# Patient Record
Sex: Female | Born: 1945 | Race: White | Hispanic: No | Marital: Married | Smoking: Former smoker
Health system: Southern US, Community
[De-identification: ages and names within clinical notes are randomized; demographics above are authoritative.]

## PROBLEM LIST (undated history)

## (undated) DIAGNOSIS — I10 Essential (primary) hypertension: Secondary | ICD-10-CM

## (undated) DIAGNOSIS — J302 Other seasonal allergic rhinitis: Secondary | ICD-10-CM

## (undated) DIAGNOSIS — I219 Acute myocardial infarction, unspecified: Secondary | ICD-10-CM

## (undated) DIAGNOSIS — M1990 Unspecified osteoarthritis, unspecified site: Secondary | ICD-10-CM

## (undated) DIAGNOSIS — R609 Edema, unspecified: Secondary | ICD-10-CM

## (undated) DIAGNOSIS — R0602 Shortness of breath: Secondary | ICD-10-CM

## (undated) DIAGNOSIS — E875 Hyperkalemia: Secondary | ICD-10-CM

## (undated) DIAGNOSIS — E785 Hyperlipidemia, unspecified: Secondary | ICD-10-CM

## (undated) DIAGNOSIS — E119 Type 2 diabetes mellitus without complications: Secondary | ICD-10-CM

## (undated) DIAGNOSIS — G8929 Other chronic pain: Secondary | ICD-10-CM

## (undated) DIAGNOSIS — L97409 Non-pressure chronic ulcer of unspecified heel and midfoot with unspecified severity: Secondary | ICD-10-CM

## (undated) DIAGNOSIS — J449 Chronic obstructive pulmonary disease, unspecified: Secondary | ICD-10-CM

## (undated) DIAGNOSIS — J069 Acute upper respiratory infection, unspecified: Secondary | ICD-10-CM

## (undated) DIAGNOSIS — M726 Necrotizing fasciitis: Secondary | ICD-10-CM

## (undated) DIAGNOSIS — D649 Anemia, unspecified: Secondary | ICD-10-CM

## (undated) DIAGNOSIS — L97919 Non-pressure chronic ulcer of unspecified part of right lower leg with unspecified severity: Secondary | ICD-10-CM

## (undated) DIAGNOSIS — L97929 Non-pressure chronic ulcer of unspecified part of left lower leg with unspecified severity: Secondary | ICD-10-CM

## (undated) DIAGNOSIS — I509 Heart failure, unspecified: Secondary | ICD-10-CM

## (undated) DIAGNOSIS — E46 Unspecified protein-calorie malnutrition: Secondary | ICD-10-CM

## (undated) DIAGNOSIS — N390 Urinary tract infection, site not specified: Secondary | ICD-10-CM

## (undated) DIAGNOSIS — B379 Candidiasis, unspecified: Secondary | ICD-10-CM

## (undated) DIAGNOSIS — F329 Major depressive disorder, single episode, unspecified: Secondary | ICD-10-CM

## (undated) DIAGNOSIS — I2510 Atherosclerotic heart disease of native coronary artery without angina pectoris: Secondary | ICD-10-CM

## (undated) DIAGNOSIS — N189 Chronic kidney disease, unspecified: Secondary | ICD-10-CM

## (undated) DIAGNOSIS — N200 Calculus of kidney: Secondary | ICD-10-CM

## (undated) DIAGNOSIS — K219 Gastro-esophageal reflux disease without esophagitis: Secondary | ICD-10-CM

## (undated) DIAGNOSIS — M810 Age-related osteoporosis without current pathological fracture: Secondary | ICD-10-CM

## (undated) DIAGNOSIS — I519 Heart disease, unspecified: Secondary | ICD-10-CM

## (undated) DIAGNOSIS — R339 Retention of urine, unspecified: Secondary | ICD-10-CM

## (undated) DIAGNOSIS — L89159 Pressure ulcer of sacral region, unspecified stage: Secondary | ICD-10-CM

## (undated) DIAGNOSIS — A4902 Methicillin resistant Staphylococcus aureus infection, unspecified site: Secondary | ICD-10-CM

## (undated) DIAGNOSIS — F32A Depression, unspecified: Secondary | ICD-10-CM

## (undated) DIAGNOSIS — F419 Anxiety disorder, unspecified: Secondary | ICD-10-CM

## (undated) DIAGNOSIS — Z792 Long term (current) use of antibiotics: Secondary | ICD-10-CM

## (undated) HISTORY — PX: JOINT REPLACEMENT: SHX530

## (undated) HISTORY — DX: Calculus of kidney: N20.0

## (undated) HISTORY — DX: Gastro-esophageal reflux disease without esophagitis: K21.9

## (undated) HISTORY — DX: Unspecified osteoarthritis, unspecified site: M19.90

## (undated) HISTORY — DX: Non-pressure chronic ulcer of unspecified heel and midfoot with unspecified severity: L97.409

## (undated) HISTORY — PX: CHOLECYSTECTOMY: SHX55

## (undated) HISTORY — PX: BACK SURGERY: SHX140

## (undated) HISTORY — PX: KNEE SURGERY: SHX244

## (undated) HISTORY — DX: Heart disease, unspecified: I51.9

## (undated) HISTORY — DX: Essential (primary) hypertension: I10

## (undated) HISTORY — DX: Anemia, unspecified: D64.9

## (undated) HISTORY — PX: SHOULDER SURGERY: SHX246

## (undated) HISTORY — PX: CARPAL TUNNEL RELEASE: SHX101

## (undated) HISTORY — DX: Hyperlipidemia, unspecified: E78.5

## (undated) HISTORY — PX: CORONARY ARTERY BYPASS GRAFT: SHX141

## (undated) HISTORY — DX: Type 2 diabetes mellitus without complications: E11.9

---

## 2004-09-16 ENCOUNTER — Ambulatory Visit: Payer: Self-pay | Admitting: Unknown Physician Specialty

## 2005-05-06 ENCOUNTER — Observation Stay: Payer: Self-pay | Admitting: Internal Medicine

## 2005-05-06 ENCOUNTER — Other Ambulatory Visit: Payer: Self-pay

## 2005-06-27 ENCOUNTER — Ambulatory Visit: Payer: Self-pay | Admitting: Urology

## 2005-09-18 ENCOUNTER — Ambulatory Visit: Payer: Self-pay | Admitting: Unknown Physician Specialty

## 2006-01-22 ENCOUNTER — Other Ambulatory Visit: Payer: Self-pay

## 2006-01-26 ENCOUNTER — Ambulatory Visit: Payer: Self-pay | Admitting: Internal Medicine

## 2006-02-13 ENCOUNTER — Ambulatory Visit: Payer: Self-pay | Admitting: Internal Medicine

## 2006-08-21 ENCOUNTER — Inpatient Hospital Stay: Payer: Self-pay | Admitting: General Practice

## 2006-10-18 ENCOUNTER — Ambulatory Visit: Payer: Self-pay | Admitting: Unknown Physician Specialty

## 2006-12-11 ENCOUNTER — Ambulatory Visit: Payer: Self-pay | Admitting: Rheumatology

## 2007-01-11 ENCOUNTER — Ambulatory Visit: Payer: Self-pay | Admitting: Urology

## 2007-01-23 ENCOUNTER — Ambulatory Visit: Payer: Self-pay | Admitting: Urology

## 2007-02-28 ENCOUNTER — Ambulatory Visit: Payer: Self-pay | Admitting: Urology

## 2007-02-28 ENCOUNTER — Other Ambulatory Visit: Payer: Self-pay

## 2007-03-07 ENCOUNTER — Ambulatory Visit: Payer: Self-pay | Admitting: Urology

## 2007-03-22 ENCOUNTER — Ambulatory Visit: Payer: Self-pay | Admitting: Urology

## 2007-07-23 ENCOUNTER — Ambulatory Visit: Payer: Self-pay | Admitting: Urology

## 2007-08-01 ENCOUNTER — Ambulatory Visit: Payer: Self-pay | Admitting: Urology

## 2007-10-24 ENCOUNTER — Ambulatory Visit: Payer: Self-pay | Admitting: Unknown Physician Specialty

## 2008-01-28 ENCOUNTER — Ambulatory Visit: Payer: Self-pay | Admitting: Urology

## 2008-05-04 ENCOUNTER — Ambulatory Visit: Payer: Self-pay

## 2008-10-26 ENCOUNTER — Ambulatory Visit: Payer: Self-pay | Admitting: Urology

## 2008-11-10 ENCOUNTER — Ambulatory Visit: Payer: Self-pay | Admitting: Specialist

## 2008-12-24 ENCOUNTER — Ambulatory Visit: Payer: Self-pay | Admitting: Unknown Physician Specialty

## 2009-03-29 ENCOUNTER — Ambulatory Visit: Payer: Self-pay | Admitting: Urology

## 2009-04-15 ENCOUNTER — Ambulatory Visit: Payer: Self-pay | Admitting: Urology

## 2009-05-04 ENCOUNTER — Ambulatory Visit: Payer: Self-pay | Admitting: Urology

## 2009-05-13 ENCOUNTER — Ambulatory Visit: Payer: Self-pay | Admitting: Urology

## 2009-06-04 ENCOUNTER — Ambulatory Visit: Payer: Self-pay | Admitting: Urology

## 2009-10-26 ENCOUNTER — Ambulatory Visit: Payer: Self-pay | Admitting: Urology

## 2010-03-24 ENCOUNTER — Ambulatory Visit: Payer: Self-pay | Admitting: Unknown Physician Specialty

## 2010-05-09 ENCOUNTER — Ambulatory Visit: Payer: Self-pay | Admitting: Urology

## 2011-05-08 ENCOUNTER — Ambulatory Visit: Payer: Self-pay | Admitting: Urology

## 2011-06-14 ENCOUNTER — Ambulatory Visit: Payer: Self-pay | Admitting: Family Medicine

## 2011-12-04 ENCOUNTER — Ambulatory Visit: Payer: Self-pay | Admitting: Unknown Physician Specialty

## 2012-04-23 ENCOUNTER — Ambulatory Visit: Payer: Self-pay | Admitting: Urology

## 2012-05-08 ENCOUNTER — Ambulatory Visit: Payer: Self-pay | Admitting: Rheumatology

## 2012-05-13 ENCOUNTER — Ambulatory Visit: Payer: Self-pay | Admitting: Rheumatology

## 2012-06-07 ENCOUNTER — Ambulatory Visit: Payer: Self-pay | Admitting: General Practice

## 2012-08-15 ENCOUNTER — Ambulatory Visit: Payer: Self-pay | Admitting: General Practice

## 2012-08-21 ENCOUNTER — Ambulatory Visit: Payer: Self-pay | Admitting: General Practice

## 2012-12-04 ENCOUNTER — Ambulatory Visit: Payer: Self-pay | Admitting: Unknown Physician Specialty

## 2013-04-28 ENCOUNTER — Ambulatory Visit: Payer: Self-pay | Admitting: Urology

## 2013-12-05 ENCOUNTER — Ambulatory Visit: Payer: Self-pay | Admitting: Internal Medicine

## 2014-02-06 ENCOUNTER — Ambulatory Visit: Payer: Self-pay | Admitting: Unknown Physician Specialty

## 2014-02-10 LAB — PATHOLOGY REPORT

## 2014-04-14 ENCOUNTER — Other Ambulatory Visit: Payer: Self-pay | Admitting: Podiatry

## 2014-04-19 LAB — WOUND CULTURE

## 2014-05-04 ENCOUNTER — Inpatient Hospital Stay: Payer: Self-pay | Admitting: Internal Medicine

## 2014-05-04 LAB — CBC WITH DIFFERENTIAL/PLATELET
BASOS PCT: 0.3 %
Basophil #: 0 10*3/uL (ref 0.0–0.1)
EOS ABS: 0 10*3/uL (ref 0.0–0.7)
Eosinophil %: 0.4 %
HCT: 34.9 % — ABNORMAL LOW (ref 35.0–47.0)
HGB: 11.2 g/dL — AB (ref 12.0–16.0)
Lymphocyte #: 0.9 10*3/uL — ABNORMAL LOW (ref 1.0–3.6)
Lymphocyte %: 13.7 %
MCH: 27.7 pg (ref 26.0–34.0)
MCHC: 32.2 g/dL (ref 32.0–36.0)
MCV: 86 fL (ref 80–100)
Monocyte #: 0.4 x10 3/mm (ref 0.2–0.9)
Monocyte %: 6.8 %
Neutrophil #: 5.1 10*3/uL (ref 1.4–6.5)
Neutrophil %: 78.8 %
Platelet: 174 10*3/uL (ref 150–440)
RBC: 4.05 10*6/uL (ref 3.80–5.20)
RDW: 14.2 % (ref 11.5–14.5)
WBC: 6.5 10*3/uL (ref 3.6–11.0)

## 2014-05-04 LAB — BASIC METABOLIC PANEL
Anion Gap: 5 — ABNORMAL LOW (ref 7–16)
BUN: 19 mg/dL — ABNORMAL HIGH (ref 7–18)
Calcium, Total: 9.3 mg/dL (ref 8.5–10.1)
Chloride: 106 mmol/L (ref 98–107)
Co2: 27 mmol/L (ref 21–32)
Creatinine: 1 mg/dL (ref 0.60–1.30)
EGFR (African American): >60
GFR CALC NON AF AMER: 58 — AB
Glucose: 191 mg/dL — ABNORMAL HIGH (ref 65–99)
Osmolality: 283 (ref 275–301)
POTASSIUM: 3.8 mmol/L (ref 3.5–5.1)
Sodium: 138 mmol/L (ref 136–145)

## 2014-05-06 LAB — SEDIMENTATION RATE: ERYTHROCYTE SED RATE: 74 mm/h — AB (ref 0–30)

## 2014-05-07 LAB — CLOSTRIDIUM DIFFICILE(ARMC)

## 2014-05-08 LAB — BASIC METABOLIC PANEL
ANION GAP: 5 — AB (ref 7–16)
BUN: 20 mg/dL — ABNORMAL HIGH (ref 7–18)
Calcium, Total: 8.6 mg/dL (ref 8.5–10.1)
Chloride: 105 mmol/L (ref 98–107)
Co2: 30 mmol/L (ref 21–32)
Creatinine: 0.82 mg/dL (ref 0.60–1.30)
GLUCOSE: 91 mg/dL (ref 65–99)
Osmolality: 282 (ref 275–301)
Potassium: 3.4 mmol/L — ABNORMAL LOW (ref 3.5–5.1)
Sodium: 140 mmol/L (ref 136–145)

## 2014-05-08 LAB — PLATELET COUNT: PLATELETS: 149 10*3/uL — AB (ref 150–440)

## 2014-05-09 LAB — CBC WITH DIFFERENTIAL/PLATELET
BASOS ABS: 0 10*3/uL (ref 0.0–0.1)
BASOS PCT: 0.4 %
Eosinophil #: 0 10*3/uL (ref 0.0–0.7)
Eosinophil %: 0.8 %
HCT: 31.8 % — AB (ref 35.0–47.0)
HGB: 10.2 g/dL — AB (ref 12.0–16.0)
LYMPHS ABS: 1.3 10*3/uL (ref 1.0–3.6)
LYMPHS PCT: 25 %
MCH: 27.1 pg (ref 26.0–34.0)
MCHC: 31.9 g/dL — AB (ref 32.0–36.0)
MCV: 85 fL (ref 80–100)
Monocyte #: 0.4 x10 3/mm (ref 0.2–0.9)
Monocyte %: 8.3 %
Neutrophil #: 3.3 10*3/uL (ref 1.4–6.5)
Neutrophil %: 65.5 %
PLATELETS: 162 10*3/uL (ref 150–440)
RBC: 3.75 10*6/uL — ABNORMAL LOW (ref 3.80–5.20)
RDW: 14.1 % (ref 11.5–14.5)
WBC: 5 10*3/uL (ref 3.6–11.0)

## 2014-05-09 LAB — BASIC METABOLIC PANEL
ANION GAP: 4 — AB (ref 7–16)
BUN: 24 mg/dL — ABNORMAL HIGH (ref 7–18)
CHLORIDE: 104 mmol/L (ref 98–107)
CREATININE: 0.88 mg/dL (ref 0.60–1.30)
Calcium, Total: 9.2 mg/dL (ref 8.5–10.1)
Co2: 33 mmol/L — ABNORMAL HIGH (ref 21–32)
EGFR (African American): >60
EGFR (Non-African Amer.): >60
Glucose: 63 mg/dL — ABNORMAL LOW (ref 65–99)
OSMOLALITY: 283 (ref 275–301)
Potassium: 3.8 mmol/L (ref 3.5–5.1)
SODIUM: 141 mmol/L (ref 136–145)

## 2014-05-09 LAB — PATHOLOGY REPORT

## 2014-05-09 LAB — WOUND CULTURE

## 2014-05-10 LAB — CLOSTRIDIUM DIFFICILE(ARMC)

## 2014-05-10 LAB — WOUND CULTURE

## 2014-05-26 ENCOUNTER — Inpatient Hospital Stay: Payer: Self-pay | Admitting: Internal Medicine

## 2014-05-26 LAB — CREATININE, SERUM
Creatinine: 1.33 mg/dL — ABNORMAL HIGH (ref 0.60–1.30)
GFR CALC AF AMER: 48 — AB
GFR CALC NON AF AMER: 41 — AB

## 2014-05-27 LAB — CBC WITH DIFFERENTIAL/PLATELET
BASOS PCT: 0.2 %
Basophil #: 0 10*3/uL (ref 0.0–0.1)
EOS PCT: 0.6 %
Eosinophil #: 0 10*3/uL (ref 0.0–0.7)
HCT: 33.2 % — ABNORMAL LOW (ref 35.0–47.0)
HGB: 10.6 g/dL — ABNORMAL LOW (ref 12.0–16.0)
LYMPHS PCT: 17.5 %
Lymphocyte #: 1.1 10*3/uL (ref 1.0–3.6)
MCH: 27.3 pg (ref 26.0–34.0)
MCHC: 31.9 g/dL — AB (ref 32.0–36.0)
MCV: 86 fL (ref 80–100)
MONO ABS: 0.4 x10 3/mm (ref 0.2–0.9)
Monocyte %: 6.3 %
Neutrophil #: 4.9 10*3/uL (ref 1.4–6.5)
Neutrophil %: 75.4 %
Platelet: 120 10*3/uL — ABNORMAL LOW (ref 150–440)
RBC: 3.88 10*6/uL (ref 3.80–5.20)
RDW: 14.9 % — ABNORMAL HIGH (ref 11.5–14.5)
WBC: 6.5 10*3/uL (ref 3.6–11.0)

## 2014-05-27 LAB — BASIC METABOLIC PANEL
ANION GAP: 9 (ref 7–16)
BUN: 30 mg/dL — ABNORMAL HIGH (ref 7–18)
CO2: 27 mmol/L (ref 21–32)
Calcium, Total: 8.8 mg/dL (ref 8.5–10.1)
Chloride: 105 mmol/L (ref 98–107)
Creatinine: 1.2 mg/dL (ref 0.60–1.30)
GFR CALC AF AMER: 54 — AB
GFR CALC NON AF AMER: 47 — AB
GLUCOSE: 156 mg/dL — AB (ref 65–99)
Osmolality: 291 (ref 275–301)
Potassium: 3.7 mmol/L (ref 3.5–5.1)
Sodium: 141 mmol/L (ref 136–145)

## 2014-05-29 LAB — CBC WITH DIFFERENTIAL/PLATELET
BASOS ABS: 0 10*3/uL (ref 0.0–0.1)
BASOS PCT: 0.8 %
Eosinophil #: 0 10*3/uL (ref 0.0–0.7)
Eosinophil %: 0.3 %
HCT: 29.4 % — ABNORMAL LOW (ref 35.0–47.0)
HGB: 9.6 g/dL — ABNORMAL LOW (ref 12.0–16.0)
LYMPHS ABS: 0.9 10*3/uL — AB (ref 1.0–3.6)
Lymphocyte %: 15.1 %
MCH: 28.4 pg (ref 26.0–34.0)
MCHC: 32.6 g/dL (ref 32.0–36.0)
MCV: 87 fL (ref 80–100)
Monocyte #: 0.3 x10 3/mm (ref 0.2–0.9)
Monocyte %: 5.5 %
Neutrophil #: 4.7 10*3/uL (ref 1.4–6.5)
Neutrophil %: 78.3 %
PLATELETS: 114 10*3/uL — AB (ref 150–440)
RBC: 3.38 10*6/uL — AB (ref 3.80–5.20)
RDW: 14.7 % — ABNORMAL HIGH (ref 11.5–14.5)
WBC: 6.1 10*3/uL (ref 3.6–11.0)

## 2014-05-29 LAB — BASIC METABOLIC PANEL
Anion Gap: 8 (ref 7–16)
BUN: 26 mg/dL — ABNORMAL HIGH (ref 7–18)
CHLORIDE: 106 mmol/L (ref 98–107)
CO2: 27 mmol/L (ref 21–32)
Calcium, Total: 8.4 mg/dL — ABNORMAL LOW (ref 8.5–10.1)
Creatinine: 1.05 mg/dL (ref 0.60–1.30)
EGFR (African American): >60
EGFR (Non-African Amer.): 55 — ABNORMAL LOW
Glucose: 196 mg/dL — ABNORMAL HIGH (ref 65–99)
Osmolality: 291 (ref 275–301)
POTASSIUM: 3.6 mmol/L (ref 3.5–5.1)
SODIUM: 141 mmol/L (ref 136–145)

## 2014-05-29 LAB — VANCOMYCIN, TROUGH: Vancomycin, Trough: 11 ug/mL (ref 10–20)

## 2014-05-29 LAB — SEDIMENTATION RATE: ERYTHROCYTE SED RATE: 59 mm/h — AB (ref 0–30)

## 2014-05-30 LAB — CBC WITH DIFFERENTIAL/PLATELET
BASOS ABS: 0 10*3/uL (ref 0.0–0.1)
BASOS PCT: 0.5 %
EOS ABS: 0 10*3/uL (ref 0.0–0.7)
EOS PCT: 0.7 %
HCT: 30.5 % — ABNORMAL LOW (ref 35.0–47.0)
HGB: 10.1 g/dL — ABNORMAL LOW (ref 12.0–16.0)
LYMPHS PCT: 14.2 %
Lymphocyte #: 0.8 10*3/uL — ABNORMAL LOW (ref 1.0–3.6)
MCH: 28.1 pg (ref 26.0–34.0)
MCHC: 33.2 g/dL (ref 32.0–36.0)
MCV: 85 fL (ref 80–100)
MONOS PCT: 5 %
Monocyte #: 0.3 x10 3/mm (ref 0.2–0.9)
NEUTROS PCT: 79.6 %
Neutrophil #: 4.5 10*3/uL (ref 1.4–6.5)
Platelet: 121 10*3/uL — ABNORMAL LOW (ref 150–440)
RBC: 3.6 10*6/uL — AB (ref 3.80–5.20)
RDW: 14.6 % — AB (ref 11.5–14.5)
WBC: 5.7 10*3/uL (ref 3.6–11.0)

## 2014-05-30 LAB — BASIC METABOLIC PANEL
Anion Gap: 6 — ABNORMAL LOW (ref 7–16)
BUN: 22 mg/dL — ABNORMAL HIGH (ref 7–18)
CALCIUM: 8.9 mg/dL (ref 8.5–10.1)
CHLORIDE: 106 mmol/L (ref 98–107)
CREATININE: 0.85 mg/dL (ref 0.60–1.30)
Co2: 29 mmol/L (ref 21–32)
EGFR (Non-African Amer.): >60
Glucose: 75 mg/dL (ref 65–99)
OSMOLALITY: 283 (ref 275–301)
Potassium: 3.6 mmol/L (ref 3.5–5.1)
Sodium: 141 mmol/L (ref 136–145)

## 2014-05-30 LAB — WOUND CULTURE

## 2014-05-30 LAB — CLOSTRIDIUM DIFFICILE(ARMC)

## 2014-05-31 LAB — CBC WITH DIFFERENTIAL/PLATELET
Basophil #: 0 10*3/uL (ref 0.0–0.1)
Basophil %: 0.6 %
EOS PCT: 0.6 %
Eosinophil #: 0 10*3/uL (ref 0.0–0.7)
HCT: 29.7 % — ABNORMAL LOW (ref 35.0–47.0)
HGB: 9.6 g/dL — ABNORMAL LOW (ref 12.0–16.0)
Lymphocyte #: 0.8 10*3/uL — ABNORMAL LOW (ref 1.0–3.6)
Lymphocyte %: 15 %
MCH: 27.6 pg (ref 26.0–34.0)
MCHC: 32.4 g/dL (ref 32.0–36.0)
MCV: 85 fL (ref 80–100)
MONOS PCT: 6.1 %
Monocyte #: 0.3 x10 3/mm (ref 0.2–0.9)
NEUTROS ABS: 4.1 10*3/uL (ref 1.4–6.5)
Neutrophil %: 77.7 %
Platelet: 106 10*3/uL — ABNORMAL LOW (ref 150–440)
RBC: 3.49 10*6/uL — AB (ref 3.80–5.20)
RDW: 14.7 % — ABNORMAL HIGH (ref 11.5–14.5)
WBC: 5.2 10*3/uL (ref 3.6–11.0)

## 2014-05-31 LAB — BASIC METABOLIC PANEL
ANION GAP: 7 (ref 7–16)
BUN: 21 mg/dL — AB (ref 7–18)
CHLORIDE: 104 mmol/L (ref 98–107)
Calcium, Total: 8.6 mg/dL (ref 8.5–10.1)
Co2: 29 mmol/L (ref 21–32)
Creatinine: 0.97 mg/dL (ref 0.60–1.30)
EGFR (African American): >60
Glucose: 166 mg/dL — ABNORMAL HIGH (ref 65–99)
OSMOLALITY: 286 (ref 275–301)
Potassium: 4 mmol/L (ref 3.5–5.1)
Sodium: 140 mmol/L (ref 136–145)

## 2014-05-31 LAB — CULTURE, BLOOD (SINGLE)

## 2014-05-31 LAB — WOUND CULTURE

## 2014-06-01 LAB — VANCOMYCIN, TROUGH: Vancomycin, Trough: 19 ug/mL (ref 10–20)

## 2014-06-02 ENCOUNTER — Encounter: Payer: Self-pay | Admitting: Internal Medicine

## 2014-06-04 LAB — CREATININE, SERUM
Creatinine: 0.9 mg/dL (ref 0.60–1.30)
EGFR (African American): >60

## 2014-06-04 LAB — VANCOMYCIN, TROUGH: Vancomycin, Trough: 26 ug/mL (ref 10–20)

## 2014-06-17 LAB — CULTURE, FUNGUS WITHOUT SMEAR

## 2014-06-20 ENCOUNTER — Encounter: Payer: Self-pay | Admitting: Internal Medicine

## 2014-07-02 ENCOUNTER — Encounter: Payer: Self-pay | Admitting: Internal Medicine

## 2014-12-08 ENCOUNTER — Ambulatory Visit: Payer: Self-pay | Admitting: Internal Medicine

## 2015-01-14 ENCOUNTER — Other Ambulatory Visit: Payer: Self-pay | Admitting: Podiatry

## 2015-01-14 ENCOUNTER — Inpatient Hospital Stay: Payer: Self-pay | Admitting: Internal Medicine

## 2015-03-09 NOTE — Op Note (Signed)
PATIENT NAME:  Suzanne Ewing, Suzanne Ewing MR#:  [id number] DATE OF BIRTH:  [birth year]  DATE OF PROCEDURE:  08/21/2012  PREOPERATIVE DIAGNOSIS: Right rotator cuff tear.   POSTOPERATIVE DIAGNOSIS: Right rotator cuff tear.    PROCEDURE PERFORMED: Right subacromial decompression and rotator cuff repair.   SURGEON: Laurice Record. Holley Bouche., MD   ANESTHESIA: General.   ESTIMATED BLOOD LOSS: Minimal.   FLUIDS REPLACED: 1600 mL of Crystalloid.   DRAINS: None.   IMPLANTS UTILIZED: ArthroCare 6.5 mm Spartan PEEK suture implants x2.   INDICATIONS FOR SURGERY: The patient is a 69 year old female who has been seen for complaints of right shoulder pain. MRI demonstrated findings consistent with a complete tear of the supraspinatus tendon with retraction. After discussion of the risks and benefits of surgical intervention, the patient expressed her understanding of the risks and benefits and agreed with plans for surgical intervention.   PROCEDURE IN DETAIL: The patient was brought in the operating room and, after adequate general anesthesia was achieved, the patient was placed in the modified beach chair position. The head was secured in a headrest and all bony prominences were well padded. The patient's right shoulder and arm were cleaned and prepped with alcohol and DuraPrep and draped in the usual sterile fashion. A "time-out" was performed as per usual protocol. The anticipated incision site was injected with 0.25% Marcaine with epinephrine. Anterior oblique incision was made roughly bisecting the anterior aspect of the acromion. Fibers of the deltoid were split in line and a "deltoid on" approach was utilized by elevating the deltoid in a subperiosteal fashion off of the acromion. The subdeltoid bursa was noted to be inflamed and was excised. There was complete tear of the supraspinatus tendon with retraction noted. An osteotome was used to remove the anterior-inferior wafer of bone from the acromion measuring  approximately 3 to 4 mm in thickness. A TPS high-speed rasp was then used to further debride and contour the subacromial area thus allowing for adequate decompression. The tendon of the supraspinatus was carefully teased and mobilized. This allowed for sufficient mobilization to reduce the leading edge of the tendon. Bed was created using rongeurs along the greater tuberosity. Two 6.5 mm Spartan PEEK suture anchors were then inserted. The supraspinatus tendon was repaired using the associated #2 Magnum wire sutures. Shoulder was placed through a range of motion with good maintenance of the repair. Wound was irrigated with copious amounts of normal saline with antibiotic solution. The deltoid was repaired in a side-to-side fashion using interrupted sutures of #1 Ethibond. Subcutaneous tissue was approximated in layers using first #0 Vicryl followed by #2-0 Vicryl. Skin was closed with a running subcuticular suture of #4-0 Vicryl. Steri-Strips were applied followed by application of a sterile dressing.   The patient tolerated the procedure well. She was transported to the recovery room in stable condition.   ____________________________ Laurice Record. Holley Bouche., MD jph:drc D: 08/21/2012 19:51:40 ET T: 08/22/2012 09:58:41 ET JOB#: [id number]  cc: Laurice Record. Holley Bouche., MD, <Dictator> JAMES P Holley Bouche MD ELECTRONICALLY SIGNED 08/22/2012 21:12

## 2015-03-13 NOTE — Consult Note (Signed)
PATIENT NAME:  Suzanne Ewing, Suzanne Ewing MR#:  [id number] DATE OF BIRTH:  13-Apr-1946  DATE OF CONSULTATION:  05/05/2014  REFERRING PHYSICIAN:     Dr. Elvina Mattes CONSULTING PHYSICIAN:  Cheral Marker. Ola Spurr, MD  DATE OF BIRTH: 04/03/1955.   DATE OF CONSULTATION: 05/05/2014.   REASON FOR CONSULT: Non-healing wound and ulceration on the left heel.   This is a very pleasant 69 year old female, history of type 2 diabetes on insulin as well as rheumatoid arthritis, who has been battling a heel ulcer for the last several months per her. She has been following with Dr. Vickki Muff for this. She has had previous cultures grown proteus and enterococcus. She says she was most recently on a course of levofloxacin as an outpatient and, after 10 days, it ran out and she was unable to get it refilled. That is when the wound started to worsen. It has since progressed. She was admitted for further IV antibiotics and surgical management of the wound. Since admission, she has been keeping it elevated. This has  continued to drain. She reports her diabetes is relatively well controlled.   PAST MEDICAL HISTORY:  1. Rheumatoid arthritis.  2. Type 2 diabetes.  3. Lumbar spinal stenosis with neurogenic claudication.  4. Chronic anemia.  5. GERD.  6. Nephrolithiasis.  7. Hypertension.  8. Left total knee replacement.   ALLERGIES: MACROBID , NITROGLYCERIN, AND TAPE.   PAST SURGICAL HISTORY: Left total knee replacement.    SOCIAL HISTORY: The patient lives at home. She does not smoke or drink.   MEDICATIONS:  Antibiotics since admission include meropenem. Other medications are reviewed. She is noted to be on prednisone chronically. She is also on an ACE inhibitor, insulin, glimepiride, Lasix, folic acid, ferrous sulfate, etodolac, diltiazem, calcium carbonate, aspirin, multivitamin, an omega-3 fatty acid, as well as pantoprazole, and tramadol.   REVIEW OF SYSTEMS: Eleven systems reviewed and negative except as per HPI.   PHYSICAL  EXAMINATION:  VITAL SIGNS: Temperature 97.8, pulse is 96, blood pressure 121/54, respirations 18, saturations 100% on room air.  GENERAL: She is pleasant, interactive, sitting up in a chair in no acute distress.  HEENT: Pupils equal, round, and reactive to light and accommodation. Extraocular movements are intact. Sclerae are anicteric. Oropharynx is clear.  NECK: Supple.  HEART: Regular.  LUNGS: Clear to auscultation bilaterally.  ABDOMEN: Soft, obese, nontender, nondistended.  EXTREMITIES: On her right leg, she has 1+ edema. She has an opened heel ulcer with some serosanguineous drainage. There is some mild surrounding erythema and warmth. She has range of motion in the ankle. Pulses are intact.   LABORATORY DATA: White blood count on admission 6.5, hemoglobin 11.2, platelets 174,000; BUN 19, creatinine 1.0. Wound culture is pending from this admission. Gram stain showed a few white blood cells and a few yeast. Colonies are too small to grow. Culture from the wound May 26th, grew heavy growth proteus, enterococcus and non-group of low beta hemolytic strep. The Proteus was sensitive to cefazolin, ceftriaxone, gentamicin, ceftazidime, imipenem, levofloxacin, and cefoxitin. It was resistant to Bactrim and ampicillin. It was intermediate to ciprofloxacin. The enterococcus was sensitive to ampicillin and linezolid.   IMAGING: MRI of the heel done June 16th, showed air and fluid collections in the subcutaneous tissue along the medial aspect of the calcaneus compatible with abscesses. No evidence of osteomyelitis. There was extensive and intense subcutaneous edema about the lower leg and foot which could be due to cellulitis and/or dependent edema.   IMPRESSION: A 69 year old with diabetes  and rheumatoid arthritis, now with a non-healing heel ulcer. Prior cultures with proteus enterococcus and streptococcus, non-typeable.   SHE IS ALLERGIC TO penicillin .   She has failed outpatient therapy with  levofloxacin, although she had run out of it for a while. There is evidence of abscesses on the MRI, but no evidence of osteomyelitis.   RECOMMENDATIONS:  1. I suggest debridement based on podiatry evaluation.  2. Cultures are pending.  3. Continue meropenem at this point.  4. Check an ESR in the morning.  5. Depending on final culture results, may be able to tailor to oral antibiotics. 6. SHE IS ALLERGIC TO penicillin , WHICH WOULD BE DIFFICULT SINCE THE ENTEROCOCCUS IS USUALLY TREATED WITH AMPICILLIN.  7. I will ask the lab to add on extra sensitivities if the enterococcus grows again. 8. Thank you for the consult. Be glad to follow with you    ____________________________ Cheral Marker. Ola Spurr, MD dpf:ts D: 05/05/2014 16:13:00 ET T: 05/05/2014 16:49:38 ET JOB#: [id number]  cc: Cheral Marker. Ola Spurr, MD, <Dictator> DAVID Ola Spurr MD ELECTRONICALLY SIGNED 05/14/2014 13:27

## 2015-03-13 NOTE — Consult Note (Signed)
PATIENT NAME:  Suzanne Ewing, Suzanne Ewing MR#:  [id number] DATE OF BIRTH:  December 13, 1945  DATE OF CONSULTATION:  05/28/2014  REQUESTING PHYSICIAN:  Dr. Elvina Mattes CONSULTING PHYSICIAN:  Cheral Marker. Ola Spurr, MD  REASON FOR CONSULTATION: Recurrent heel ulceration.   HISTORY OF PRESENT ILLNESS: This is a very pleasant 69 year old female with history of type 2 diabetes and rheumatoid arthritis who had been admitted initially in June with progression of a heel ulcer for several months. At that time, previous cultures have grown Proteus and enterococcus.   She underwent some debridement at that time and was discharged on levofloxacin. However, she reports the wound worsened and she was readmitted for repeat debridement. She has had surgery on July 8th by Dr. Vickki Muff. He did find ulcerations and abscess down to the bone. The wound is now dressed with a wound VAC and there were antibiotic beads placed.   PAST MEDICAL HISTORY:  1. Diabetes.  2. Rheumatoid arthritis.  3. Spinal stenosis with neurogenic claudication.  4. Chronic anemia.  5. GERD.  6. Nephrolithiasis.  7. Hypertension.  8. Left knee replacement.   PAST SURGICAL HISTORY: Left knee replacement.   SOCIAL HISTORY: Lives at home.  Does not smoke or drink.   ALLERGIES: SHE IS ALLERGIC TO MACROBID, NITROGLYCERIN, AND TAPE.   FAMILY HISTORY: Noncontributory.   REVIEW OF SYSTEMS:  Eleven systems reviewed and negative except as per HPI.   ANTIBIOTICS SINCE ADMISSION: Include clindamycin, meropenem, and vancomycin.   PHYSICAL EXAMINATION:  VITAL SIGNS: Temperature 98.1, pulse 82, blood pressure 114/65, respirations 17, saturations 94% on room air. She has been afebrile since admission.  GENERAL: She is pleasant, interactive, lying in bed in no acute distress.  HEENT: Pupils equal, round, reactive to light and accommodation. Extraocular movements are intact. Sclerae anicteric.  OROPHARYNX: Clear.  NECK: Supple.  HEART: Regular.  LUNGS: Clear to  auscultation bilaterally.  EXTREMITIES: She has an ulcer in the left heel that has been debrided and is now covered with a wound VAC. There is some mild surrounding erythema.  LABORATORY, DIAGNOSTIC AND RADIOLOGICAL DATA: Richrd Humbles is reviewed. Previous cultures done in May had heavy growth of Proteus as well as Enterococcus faecalis and non-group  beta-hemolytic strep. Wound culture June 15th grew Candida as well as Proteus. Wound culture June 17th was negative. Clostridium difficile  June 17th was negative. Blood cultures, July 7th, no growth to date. Wound culture, July 8th, no growth to date. White blood count was 6.5. Hemoglobin 10.6, platelets 120,000, creatinine 1.2 with a GFR of 47.  Imaging: X-ray of the heel shows no radiographic evidence of osteomyelitis.   IMPRESSION: A 69 year old with diabetes as well as rheumatoid arthritis on immunosuppressives readmitted with worsening heel ulceration. She had failed outpatient treatment with levofloxacin. She is now status post debridement July 8th. There does seem to be bony involvement. She is currently on meropenem and vancomycin.   RECOMMENDATIONS:  Continue current antibiotics while awaiting cultures. PICC line has been placed. She will likely need at least 4-6 weeks of IV antibiotics. Further final recommendations would be based on what grows with this.  SHE DOES HAVE AN ALLERGY TO PENICILLIN. That may make it more complicated to treat the enterococcus.   Thank you for the consult. I will be glad to follow with you.    ____________________________ Cheral Marker. Ola Spurr, MD dpf:dd D: 05/28/2014 22:59:43 ET T: 05/29/2014 01:52:43 ET JOB#: [id number]  cc: Cheral Marker. Ola Spurr, MD, <Dictator> DAVID Ola Spurr MD ELECTRONICALLY SIGNED 06/08/2014 21:19

## 2015-03-13 NOTE — Consult Note (Signed)
Details:   - full note dicated. Plan for I & D tomorrow. NPO for now. Wound culture now and in OR tomorrow. PT consult.   Electronic Signatures: Samara Deist (MD)  (Signed 07-Jul-15 19:52)  Authored: Details   Last Updated: 07-Jul-15 19:52 by Samara Deist (MD)

## 2015-03-13 NOTE — Consult Note (Signed)
PATIENT NAME:  Suzanne Ewing, Suzanne Ewing MR#:  [id number] DATE OF BIRTH:  1946-08-03  DATE OF CONSULTATION:  05/04/2014  CONSULTING PHYSICIAN:  Gerrit Heck. Kaia Depaolis, DPM  REASON FOR CONSULTATION: Nonhealing wound, left heel.   HISTORY OF PRESENT ILLNESS: The patient has had an ulceration on this heel for several months. Dr. Vickki Muff has been following her.  She underwent a couple of rounds of antibiotics, but this continues to drain and cause problems. Previous cultures showed positive cultures for Proteus and enterococcus. She has been started on meropenem by Dr. Posey Pronto, has some drainage from the wound and some redness to her leg. She is a 69 year old female.   PAST MEDICAL HISTORY:  Includes total left knee rheumatoid arthritis, type 2 diabetes which is insulin-dependent, significant lumbar spinal stenosis with neurogenic claudication, chronic anemia, GERD, nephrolithiasis, hypertension and also nonhealing left heel ulcer. Has a history of UTIs as well.   ALLERGIES: Include MACROBID, PENICILLIN, NITROGLYCERIN AND TAPE.  HOME MEDICATIONS: Are currently pending and reconsolidation from the primary care.  The only lab available at this time is blood glucose which was 213.  PHYSICAL EXAMINATION: VITAL SIGNS: Include temperature 98.4, pulse 101, respirations 20, blood pressure is 154/74. Pulse ox is 98. LOWER EXTREMITY: The patient just had the dressing changed and is scheduled for an MRI tomorrow. I will hold off on wound evaluation until after she gets the MRI so I can see it relative to the possibility of osteomyelitis in the region. I will follow her tomorrow once she gets this done and correlate the findings with treatment plan.    ____________________________ Gerrit Heck. Khyla Mccumbers, DPM mgt:ce D: 05/04/2014 18:30:06 ET T: 05/04/2014 19:06:23 ET JOB#: [id number]  cc: Gerrit Heck Alegra Rost, DPM, <Dictator> Perry Mount MD ELECTRONICALLY SIGNED 05/07/2014 7:59

## 2015-03-13 NOTE — Discharge Summary (Signed)
Dates of Admission and Diagnosis:  Date of Admission 04-May-2014   Date of Discharge 11-May-2014   Admitting Diagnosis Non-healing diabetic foot ulcer and lower leg cellulitis   Final Diagnosis Non-healing diabetic foot ulcer and lower leg cellulitis   Discharge Diagnosis 1 Non-healing diabetic foot ulcer and lower leg cellulitis   2 diabetes   3 hypertension   4 rheumatoid arthritis    Chief Complaint/History of Present Illness 69 year old lady with h/o diabetes, hypertension and rheumatoid arthritis presented with non healing diabetic foot ulcer on left heel. She was noted to have redness and swelling of left lower leg c/w cellulitis. LE doppler was negative for DVT.   Allergies:  Macrobid: Anaphylaxis  NTG: Other  Penicillin: Itching, Anaphylaxis, Resp. Distress  Macrodantin: Rash  Naproxen: Rash  Nitrofurantoin: Other  Morphine: Other  Biaxin: Other  Tape: Other  Routine Micro:  15-Jun-15 16:57   Micro Text Report WOUND AER/ANAEROBIC CULT   ORGANISM 1                MODERATE GROWTH CANDIDA ALBICANS   ORGANISM 2                LIGHT GROWTH PROTEUS MIRABILIS   COMMENT                   NO ANAEROBES ISOLATED IN 4 DAYS   COMMENT                   WITH NORMAL SKIN FLORA   GRAM STAIN                FEW WHITE BLOOD CELLS   GRAM STAIN                FEW YEAST   ANTIBIOTIC                    ORG#1    ORG#2     AMPICILLIN                             R         CEFAZOLIN                              S CEFOXITIN                              S         CEFTAZIDIME                            S         CEFTRIAXONE                            S         CIPROFLOXACIN                          I         GENTAMICIN                             S         IMIPENEM  S         LEVOFLOXACIN                           S         TRIMETHOPRIM/SULFAMETHOXAZOLE          R  Specimen Source LEFT FOOT ULCER  Culture Comment NO ANAEROBES ISOLATED IN 4 DAYS  Gram Stain  1 FEW WHITE BLOOD CELLS  Gram Stain 2 FEW YEAST  Result(s) reported on 09 May 2014 at 11:20AM.  Organism Name Oso  Organism Name CANDIDA ALBICANS  Organism Quantity LIGHT GROWTH  Organism Quantity MODERATE GROWTH  Cefazolin Sensitivity S  Ampicillin Sensitivity R  Ceftriaxone Sensitivity S  Ciprofloxacin Sensitivity I  Gentamicin Sensitivity S  Ceftazidime Sensitivity S  Imipenem Sensitivity S  Levofloxacin Sensitivity S  Trimethoprim/Sulfamethoxazole Sensitivty R  Cefoxitin Sensitivity S  Organism 1 MODERATE GROWTH CANDIDA ALBICANS  Organism 2 LIGHT GROWTH PROTEUS MIRABILIS  Culture Comment . WITH NORMAL SKIN FLORA  17-Jun-15 15:23   Micro Text Report WOUND AER/ANAEROBIC CULT   COMMENT                   NO GROWTH AEROBICALLY/ANAEROBICALLY IN 4 DAYS   GRAM STAIN                RARE WHITE BLOOD CELLS   GRAM STAIN                RARE GRAM POSITIVE COCCI   GRAM STAIN                RARE GRAM POSITIVE ROD   ANTIBIOTIC                       Specimen Source LEFT FOOT  Culture Comment NO GROWTH AEROBICALLY/ANAEROBICALLY IN 4 DAYS  Gram Stain 1 RARE WHITE BLOOD CELLS  Gram Stain 2 RARE GRAM POSITIVE COCCI  Gram Stain 3 RARE GRAM POSITIVE ROD  Result(s) reported on 10 May 2014 at 11:16AM.  18-Jun-15 11:13   Micro Text Report CLOSTRIDIUM DIFFICILE   C.DIFFICILE ANTIGEN       C.DIFFICILE GDH ANTIGEN : POSITIVE   C.DIFFICILE TOXIN A/B     C.DIFFICILE TOXINS A AND B : NEGATIVE   PCR FOR TOXIGENIC C.DIFF  PCR FOR TOXIGENIC C.DIFFICILE : NEGATIVE   INTERPRETATION Negative for toxigenic  C. difficile. Toxin gene and active toxin production not detected. May be a nontoxigenic strain of C. difficile bacteria present, lacking the ability to produce toxin.    ANTIBIOTIC                        21-Jun-15 21:48   Micro Text Report CLOSTRIDIUM DIFFICILE   C.DIFFICILE ANTIGEN       C.DIFFICILE GDH ANTIGEN : NEGATIVE   C.DIFFICILE TOXIN A/B     C.DIFFICILE TOXINS A AND B :  NEGATIVE   INTERPRETATION            Negative for C. difficile.    ANTIBIOTIC                        Routine Chem:  15-Jun-15 17:16   Glucose, Serum  191  BUN  19  Creatinine (comp) 1.00  Sodium, Serum 138  Potassium, Serum 3.8  Chloride, Serum 106  CO2, Serum 27  Calcium (Total), Serum 9.3  Anion Gap  5  Osmolality (calc) 283  eGFR (African American) >60  eGFR (Non-African American)  58 (eGFR values <52m/min/1.73 m2 may be an indication of chronic kidney disease (CKD). Calculated eGFR is useful in patients with stable renal function. The eGFR calculation will not be reliable in acutely ill patients when serum creatinine is changing rapidly. It is not useful in  patients on dialysis. The eGFR calculation may not be applicable to patients at the low and high extremes of body sizes, pregnant women, and vegetarians.)  Routine Hem:  15-Jun-15 17:16   WBC (CBC) 6.5  RBC (CBC) 4.05  Hemoglobin (CBC)  11.2  Hematocrit (CBC)  34.9  Platelet Count (CBC) 174  MCV 86  MCH 27.7  MCHC 32.2  RDW 14.2  Neutrophil % 78.8  Lymphocyte % 13.7  Monocyte % 6.8  Eosinophil % 0.4  Basophil % 0.3  Neutrophil # 5.1  Lymphocyte #  0.9  Monocyte # 0.4  Eosinophil # 0.0  Basophil # 0.0 (Result(s) reported on 04 May 2014 at 07:09PM.)  17-Jun-15 05:18   Erythrocyte Sed Rate  74 (Result(s) reported on 06 May 2014 at 06:47AM.)   PERTINENT RADIOLOGY STUDIES: [place]    16-Jun-15 10:28, [place] Flow Doppler Low Extrem Left (Leg)  [place] Flow Doppler Low Extrem Left (Leg)   REASON FOR EXAM:    Swelling  COMMENTS:       PROCEDURE: [place] - [place]OPPLER LOW EXTR LEFT  - May 05 2014 10:28AM     CLINICAL DATA:  Left lower extremity swelling.    EXAM:  LEFT LOWER EXTREMITY VENOUS DOPPLER ULTRASOUND    TECHNIQUE:  Gray-scale sonography with graded compression, as well as color  Doppler and duplex ultrasound, were performed to evaluate the deep  venous system from the level of the common femoral  vein through the  popliteal and proximal calf veins. Spectral Doppler was utilized to  evaluate flow at rest and with distal augmentation maneuvers.    COMPARISON:  None.    FINDINGS:  Normal compressibility, augmentation and color Doppler flow in the  left common femoral vein, left femoral vein and left popliteal vein.  The left saphenofemoral junction is patent. The left great saphenous  vein is patent. Visualized calf veins are patent.     IMPRESSION:  Negative for left lower extremity deep vein thrombosis.      Electronically Signed    By: AMarkus DaftM.D.    On: 05/05/2014 11:00         Verified By: ABurman Riis M.D.,  MRI:    16-Jun-15 12:06, MRI Foot Left Without Contrast  MRI Foot Left Without Contrast   REASON FOR EXAM:    non healing ulcer  COMMENTS:       PROCEDURE: MR  - MR FOOT LEFT  WO CONTRAST  - May 05 2014 12:06PM     CLINICAL DATA:  Nonhealing ulcer left heel.    EXAM:  MRI OF THE LEFT FOREFOOT WITHOUT CONTRAST    TECHNIQUE:  Multiplanar, multisequence MR imaging was performed. No intravenous  contrast was administered.    COMPARISON:  None.  FINDINGS:  There is extensive subcutaneous edema about the ankle and visualized  foot. Two air and fluid collections are seen in the subcutaneous  tissues along the medial aspect of the heel. The more cephalad  collection measures 1.5 x 1.3 x 1.1 cm. A more inferior collection  measures 1.3 x 1.7 x 0.8 cm. The collections are approximately 2.5-3  cm anterior to the posterior most margin of the calcaneus and are  confined to the subcutaneous tissues. There is no bone marrow signal  abnormality to suggest osteomyelitis. No joint effusion is seen.  Major ligaments and tendons are unremarkable.     IMPRESSION:  Air and fluid collections in the subcutaneous tissues along the  medial aspect of the calcaneus are compatible with abscesses. No  evidence of osteomyelitis is identified.  Extensive and intense  subcutaneous edema about the lower leg and  foot could be due to cellulitis and or dependent change.      Electronically Signed    By: Inge Rise M.D.    On: 05/05/2014 12:18         Verified By: Ramond Dial, M.D.,   Pertinent Past History:  Pertinent Past History Diabetes Hypertension Rheumatoid arthritis   Hospital Course:  Hospital Course 69 year old lady with diabetes, hypertension and rheumatoid arthritis admitted with non healing diabetic foot ulcer on left heel and left lower leg cellulitis. Patient was started on Meropenem based on prior wound c/s reports from 04/14/14. MRI was negative for osteomyelitis. Wound debridement was done by podiatry. Diabetes: was controlled with Levemir and Novolog during her hospital stay. Hypertension: Lisinopril and Diltiazem were continued. Rheumatoid arthritis: usual dose fo Etodolac (Lodine) and Prednisone were continued. Repeat wound c/s revealed proteus mirabilis sensitive to Levofloxacin. Shall discharge home on Levofloxacin.   Condition on Discharge Stable   DISCHARGE INSTRUCTIONS HOME MEDS:  Medication Reconciliation: Patient's Home Medications at Discharge:     Medication Instructions  furosemide 40 mg oral tablet  1 tab(s) orally 2 times a day   prednisone 1 mg oral tablet  3 tab(s) orally once a day   lisinopril 20 mg oral tablet  1 tab(s) orally once a day (in the morning)   prednisone 5 mg oral tablet  1 tab(s) orally once a day (in the morning) 8:00 am   aspirin low dose 81 mg oral tablet  1 tab(s) orally once a day   potassium gluconate 99 mg oral caplets  1 tab(s) orally once a day   tramadol 50 mg oral tablet  1 tab(s) orally 4 times a day   etodolac 500 mg oral tablet  1 tab(s) orally 2 times a day with food   lidocaine patch 5 % topical  Apply topically to affected area once a day   actonel 35 mg oral tablet  1 tab(s) orally once a week   ferrous sulfate 325 mg oral tablet  1 tab(s) orally once a day    folic acid 1 mg oral tablet  1 tab(s) orally 2 times a day   novolin n human recombinant 100 units/ml subcutaneous suspension  26 unit(s) subcutaneous once a day   levocetirizine 5 mg oral tablet  1 tab(s) orally once a day (in the evening)   amaryl 4 mg oral tablet  2 tab(s) orally once a day   fish oil 1000 mg oral capsule  1 cap(s) orally once a day (at bedtime)   novolog 100 units/ml subcutaneous solution  16 unit(s) subcutaneous once a day   remicade 100 mg intravenous powder for injection   intravenous MONTHLY   multivitamin  1  orally once a day   cyanocobalamin 1000 mcg/ml injectable solution   injectable MONTHLY   lorazepam 0.5 mg oral tablet  1/2 TO 1  tab(s) orally DAILY   ascorbic acid 1000 mg oral tablet  1 tab(s) orally once  a day   cranberry - oral capsule   orally once a day   cardizem cd 120 mg/24 hours oral capsule, extended release  1 cap(s) orally once a day   tylenol 500 mg oral tablet  1-2 tab(s) orally every 6 hours   cadexomer iodine topical 0.9% topical gel  Apply topically to affected area 2 times a day   mucinex 600 mg oral tablet, extended release  1 tab(s) orally every 12 hours   levaquin 500 mg oral tablet  1 tab(s) orally every 24 hours   nicotine 14 mg/24 hr transdermal film, extended release  1 patch transdermal once a day    STOP TAKING THE FOLLOWING MEDICATION(S):    omeprazole 20 mg oral delayed release tablet: 1 tab(s) orally 2 times a day  Physician's Instructions:  Diet Low Sodium  Carbohydrate Controlled (ADA) Diet   Activity Limitations As tolerated   Return to Work Not Applicable   Time frame for Follow Up Appointment 1-2 weeks  Dr. Ola Spurr   Time frame for Follow Up Appointment 1-2 weeks  [facility] podiatry   Time frame for Follow Up Appointment 1-2 weeks  Dr. Cathlean Cower, Justin(Ordered): [facility], [address] [place], [place] [address], [place]   Adrian Prows Patrick(Consultant): [facility], Internal Medicine, [address] [place], [place] [address], [place], (Family Physician): [facility], [address] [place],  [address], [place] [phone]  Electronic Signatures: Glendon Axe (MD)  (Signed 22-Jun-15 13:40)  Authored: ADMISSION DATE AND DIAGNOSIS, CHIEF COMPLAINT/HPI, Allergies, PERTINENT LABS, PERTINENT RADIOLOGY STUDIES, PERTINENT PAST HISTORY, HOSPITAL COURSE, [place], PATIENT INSTRUCTIONS, Follow Up Physician   Last Updated: 22-Jun-15 13:40 by Glendon Axe (MD)

## 2015-03-13 NOTE — Op Note (Signed)
PATIENT NAME:  Suzanne Ewing, Suzanne Ewing MR#:  [id number] DATE OF BIRTH:  1946/07/09  DATE OF PROCEDURE:  05/27/2014  PREOPERATIVE DIAGNOSIS: Left heel ulceration to bone with abscess.   POSTOPERATIVE DIAGNOSIS:  Left heel ulceration to bone with abscess.   PROCEDURE PERFORMED: Open excisional debridement left heel ulcer to bone.    SURGEON: Keawe Marcello A. Vickki Muff, DPM.   ANESTHESIA: General.   HEMOSTASIS: Epinephrine 1:100,000 infiltrated along the incision site.   COMPLICATIONS: None.   SPECIMEN: Wound cultures for aerobic, anaerobic and fungal cultures.   OPERATIVE INDICATIONS: This is a 69 year old female, admitted from the outpatient clinic to the hospital, with a draining left heel ulceration. This had previously been I and D'd by myself and has continued to have drainage from the area. We brought her to the OR today for operative debridement and incision and drainage of the infection. The risks, benefits, alternatives, and complications associated with surgery were discussed with the patient and full informed consent has been given.   OPERATIVE PROCEDURE: The patient was brought into the OR and placed on the operating room table in the supine position. Intubation was administered by the anesthesia team with general. The left lower extremity was then prepped and draped in the usual sterile fashion. Attention was directed to the plantar aspect of the left heel where the ulcer was noted. A longitudinal incision was then made through the previous incision site. Sharp and blunt dissection was carried down to the plantar portion of the heel. At this time, the plantar aspect of the wound was opened up. There was no severe purulent drainage. I then debrided the entire portion of the plantar heel down to bone with a VersaJet. Excisional debridement was performed with the VersaJet instrument. The wound was then flushed with copious amounts of irrigation. The skin was then closed with a central portion of the wound  left open. It was then packed with calcium sulfate beads impregnated with vancomycin and tobramycin. A wound VAC was then placed overlying the heel region. She tolerated the procedure and anesthesia well, and was transported from the Operating Room to the PACU with all vital signs stable and neurovascular status intact. I will readmit her back to the floor. Will, hopefully, discharge her in 1 to 2 days. Will need to get home health involved for wound VAC dressing changes.    ____________________________ Pete Glatter Vickki Muff, DPM jaf:aj D: 05/27/2014 16:27:23 ET T: 05/28/2014 04:36:23 ET JOB#: [id number]  cc: Larkin Ina A. Vickki Muff, DPM, <Dictator> Candice Tobey DPM ELECTRONICALLY SIGNED 06/19/2014 7:43

## 2015-03-13 NOTE — Op Note (Signed)
PATIENT NAME:  Suzanne Ewing, Suzanne Ewing MR#:  [id number] DATE OF BIRTH:  08-01-1946  DATE OF PROCEDURE:  05/06/2014  PREOPERATIVE DIAGNOSIS: Left heel abscess.  POSTOPERATIVE DIAGNOSIS:  Left heel abscess.  PROCEDURE: Incision and drainage of left heel abscess.   ANESTHESIA: IV sedation with local.   HEMOSTASIS: Epinephrine 1:100,000 infiltrated along the incision site.   COMPLICATIONS: None.   SPECIMEN: Deep soft tissue from a left heel ulcer and deep wound culture, left heel ulcer.   OPERATIVE INDICATIONS: This is a 69 year old female with a left heel ulceration with an abscess to the left heel. MRI showed draining abscess in the left plantar medial aspect of the heel. She was brought today to the OR for drainage of this area. All risks, benefits, alternatives, and complications associated with surgery were discussed with the patient in full and consent has been given.   OPERATIVE PROCEDURE: The patient was brought into the OR and placed on operating table in the supine position. IV sedation was administered by the anesthesia team. The left lower extremity was then prepped and draped in the usual sterile fashion. Attention was directed to the plantar aspect of the left heel where a midline incision was made overlying the central aspect of the heel at the ulcerative site. Full thickness dissection was taken down to the deeper layers. At this time, there was noticed a large amount of fibrotic tissue in the central aspect with a cavity directly against the calcaneal region. Some of this fibrotic tissue was excised and sent for pathological examination. Next, there was a tunneling abscess directly to the medial aspect of the heel that had not quite penetrated the epidermis. I bluntly dissected to this and then opened this medial aspect up with a small incision. A small amount of purulent drainage was noted from this area. The deeper area was then debrided with a Versajet. Excisional debridement of this 5 x 3 x  5 cm ulcerative site was undertaken. This was an excisional type of debridement. The abscessed area was then flushed with a pulsed lavage. Finally, the plantar aspect of the calcaneal region was packed with Stimulan calcium phosphate with vancomycin. This was placed directly against the bone with a paste-type of material when it was applied. The wound was then closed proximal and distally. The central aspect was left open and packed with Nu Gauze plain packing. Closure was with 3-0 Vicryl . The packing was placed into the abscessed area exiting the medial aspect of the heel. A large bulky dressing was placed to the left heel. She will be readmitted to the floor. She is on IV antibiotics and infectious disease has been consulted. I will see her tomorrow for dressing change. She needs to be completely nonweightbearing to this area.    ____________________________ Pete Glatter. Vickki Muff, DPM jaf:ts D: 05/06/2014 15:54:31 ET T: 05/06/2014 16:25:11 ET JOB#: [id number]  cc: Larkin Ina A. Vickki Muff, DPM, <Dictator> Samad Thon DPM ELECTRONICALLY SIGNED 06/19/2014 7:46

## 2015-03-13 NOTE — Consult Note (Signed)
PATIENT NAME:  Suzanne Ewing, Suzanne Ewing MR#:  [id number] DATE OF BIRTH:  [birth year]  DATE OF CONSULTATION:  05/26/2014  REFERRING PHYSICIAN:   CONSULTING PHYSICIAN:  Jaycen Vercher A. Vickki Muff, DPM  REASON FOR CONSULTATION: Left heel ulceration.   HISTORY OF PRESENT ILLNESS: This is a 69 year old diabetic patient with a history of rheumatoid arthritis on multiple agents for her rheumatoid arthritis who has had a nonhealing left heel ulceration. She was seen in the outpatient clinic and underwent an operative I and D approximately 2 weeks ago. She has been on p.o. Levaquin for a Proteus infection that was cultured. Today was seen with continued worsening drainage, and I discussed admission with the internal medicine team and she was admitted for continued antibiotics and operative I and D.   PAST MEDICAL HISTORY: Rheumatoid arthritis, diabetes, history of lumbar spinal stenosis, chronic anemia, GERD, nephrolithiasis, hypertension.   ALLERGIES: MACROBID, PENICILLIN, NITROGLYCERIN, AND TAPE.   SOCIAL HISTORY:  Denies smoking. Lives at home. Currently is living with her sister who has a wheelchair access in her basement.   REVIEW OF SYSTEMS: She denies any fevers or chills. She has severe rheumatoid arthritis with severe myalgias and weakness. No shortness of breath or chest pains.   PHYSICAL EXAMINATION:  GENERAL: She is alert and oriented.  VASCULAR: She has palpable DP and PT pulses.  NEUROLOGIC: She is neuropathic to the lower extremities dermal.  On her left plantar heel, she has a full-thickness deep ulceration of approximately 2 cm in diameter with depth down to bone. There is a serosanguineous and slightly purulent drainage from the plantar left heel. There is no medial or lateral erythema. A previous medial heel abscessed area is closing nicely with no continued purulent drainage from this wound.  MUSCULOSKELETAL: She has severe arthritic changes with her knee and lower extremities. There is no fluctuance of  the heel, except the central heel open ulcerative site.   RADIOLOGICAL DATA:  X-rays: Two views of the left heel shows the open ulcerative site down to the deeper portion just inferior to the plantar calcaneus. There are no obvious erosive changes or periosteal reactions.   ASSESSMENT: Full-thickness left heel ulceration with abscess.   PLAN: She needs operative I and D. I discussed this in great detail. We will plan on performing this tomorrow. The risks, benefits, alternatives, and complications associated with the surgery were discussed with the patient in full, consisting of continued need for surgery. Continue antibiotics with possible below-the-knee amputation as being a worse case scenario. We will continue with the current antibiotics.  A wound culture will be performed at bedside as well as intraoperatively. We will consult infectious disease for continued assistance with antibiotics. May need to consider adding an antifungal. I appreciate internal medicine's input on perioperative medical issues. I will see her tomorrow for surgery and she is n.p.o. after midnight tonight.    ____________________________ Pete Glatter. Vickki Muff, DPM jaf:dd D: 05/26/2014 19:50:46 ET T: 05/26/2014 20:16:30 ET JOB#: [id number]  cc: Larkin Ina A. Vickki Muff, DPM, <Dictator> Nataliya Graig DPM ELECTRONICALLY SIGNED 06/19/2014 7:44

## 2015-03-13 NOTE — H&P (Signed)
PATIENT NAME:  Suzanne Ewing, Suzanne Ewing MR#:  [id number] DATE OF BIRTH:  04-01-1946  DATE OF ADMISSION:  05/04/2014  PRIMARY CARE PHYSICIAN: Glendon Axe, MD  PODIATRIST: Pete Glatter. Vickki Muff, DPM  CHIEF COMPLAINT: Nonhealing left heel ulcer for more than 6 weeks.  Suzanne Ewing is a pleasant 69 year old Caucasian female with past medical history of diabetes, rheumatoid arthritis, history of diabetic foot ulcers, who is admitted from Dr. Alvera Singh office for nonhealing left heel ulcer. The patient has been having this nonhealing left heel ulcer with significant swelling of her left calf. Finished up a round of antibiotics with Levaquin. Prior to that, she was on Bactrim. She continues to have significant swelling and drainage from the left heel. She is being admitted for nonhealing diabetic left heel ulcer along with suspected osteomyelitis.    PAST MEDICAL HISTORY: 1.  UTI.  2.  Left total knee.  3.  Rheumatoid arthritis.  4.  Type 2 diabetes, insulin-dependent.  5.  Lumbar spinal stenosis with neurogenic claudication.  6.  Chronic anemia.  7.  GERD.  8.  Nephrolithiasis.  9.  Hypertension.  10.  Nonhealing left heel ulcer.   ALLERGIES: MACROBID, PENICILLIN, NITROGLYCERIN, AND TAPE.   HOME MEDICATIONS: Pending reconciliation at present.   REVIEW OF SYSTEMS:    CONSTITUTIONAL: No fever. Positive for fatigue, weakness.  EYES: No blurred or double vision, glaucoma or cataract.  ENT: No tinnitus, ear pain, hearing loss.  RESPIRATORY: No cough, hemoptysis, shortness of breath.  CARDIOVASCULAR: No chest pain, orthopnea, edema. Positive for hypertension.  GASTROINTESTINAL: No nausea, vomiting, diarrhea, melena or rectal bleeding.  GENITOURINARY: No dysuria or hematuria.  ENDOCRINE: No polyuria, nocturia or thyroid problems.  HEMATOLOGIC: Positive for chronic anemia. No easy bruising or bleeding disorder.  SKIN: The patient has a nonhealing left heel ulcer.  MUSCULOSKELETAL: Positive for arthritis and  back pain.  NEUROLOGIC: No CVA, TIA. Positive for neuropathy.  PSYCHIATRIC: No anxiety, depression or bipolar.   All other systems reviewed and negative.   PHYSICAL EXAMINATION: GENERAL: The patient is obese, alert, oriented x 3, not in acute distress.  VITAL SIGNS: She is afebrile. Pulse is 101. Blood pressure is 154/74. Sats are 98% on room air.  HEENT: Atraumatic, normocephalic. Pupils: PERRLA. EOM intact. Oral mucosa is moist.  NECK: Supple. No JVD. No carotid bruit.  LUNGS: Clear to auscultation bilaterally. No rales, rhonchi, respiratory distress or labored breathing.  HEART: Both the heart sounds are normal. Rate, rhythm is regular. PMI not lateralized. Chest nontender.  ABDOMEN: Obese, soft, nontender. No organomegaly. Positive bowel sounds.  EXTREMITIES: Good pedal pulses, good femoral pulses in the right lower extremity. Left lower extremity: The patient has significant pedal edema along with edema, swollen leg up to the knee. Unable to feel pedal pulses, good femoral pulses. The patient has significant amount of nonhealing diabetic ulcer over the heel on the left with minimal cellulitis.  NEUROLOGIC: Grossly intact cranial nerves II through XII. No motor deficit. The patient does have peripheral neuropathy in both lower extremities.  PSYCHIATRIC: The patient is awake, alert, oriented x 3.   LABORATORY DATA:  CBC and basic metabolic panel pending.   ASSESSMENT AND PLAN:  A 69 year old Ms. Suzanne Ewing with history of type 2 diabetes (insulin-requiring), hypertension, morbid obesity, ongoing tobacco abuse, is admitted with:  1.  Nonhealing diabetic foot ulcer over the left heel/calcaneum for more than 6 weeks: The patient has had a couple rounds of antibiotics. She continues to have significant swelling and edema along  with drainage from the open wound on the left plantar aspect of the foot. The patient is going to be started on IV meropenem. She has had cultures May 26, which showed  heavy growth off Proteus mirabilis, Enterococcus faecalis. Given her sensitivities, she will be placed on IV meropenem. Podiatry consultation with Dr. Vickki Muff for possible debridement. Further work-up according to Dr. Vickki Muff. Will get ultrasound of the left lower extremity given significant swelling to rule out deep vein thrombosis.  2.  Type 2 diabetes: Will resume her home dose insulin along with sliding scale.  3.  Hypertension: Continue blood pressure medications.  4.  History of rheumatoid arthritis.  5.  Chronic anemia: Follow up hemoglobin. Transfuse as needed.  6.  Deep vein thrombosis prophylaxis: Subcutaneous heparin.  7.  Further work-up according to the patient's clinical course. Hospital admission plan was discussed with the patient. No family members present.    ____________________________ Hart Rochester. Posey Pronto, MD sap:jcm D: 05/04/2014 17:05:50 ET T: 05/04/2014 17:53:48 ET JOB#: [id number]  cc: Annalina Needles A. Posey Pronto, MD, <Dictator> Glendon Axe, MD Pete Glatter. Vickki Muff, DPM  Ilda Basset MD ELECTRONICALLY SIGNED 05/23/2014 10:25

## 2015-03-13 NOTE — Discharge Summary (Signed)
Dates of Admission and Diagnosis:  Date of Admission 26-May-2014   Date of Discharge 01-Jun-2014   Admitting Diagnosis Non healing diabetic foot ulcer on left heel   Final Diagnosis Non healing diabetic foot ulcer on left heel   Discharge Diagnosis 1 Non healing diabetic foot ulcer on left heel   2 Type 2 diabetes   3 HTN   4 Rheumatoid arthritis    Chief Complaint/History of Present Illness Patient was admitted for non healing diabetic foot ulcer on left heel with some purulent drainage. Refer to [place] for details. Recent hospitalization in June for wound debridement.   Allergies:  Macrobid: Anaphylaxis  NTG: Other  Penicillin: Itching, Anaphylaxis, Resp. Distress  Macrodantin: Rash  Naproxen: Rash  Nitrofurantoin: Other  Morphine: Other  Biaxin: Other  Tape: Other    Routine Micro:  07-Jul-15 15:47   Micro Text Report BLOOD CULTURE   COMMENT                   NO GROWTH AEROBICALLY/ANAEROBICALLY IN 5 DAYS   ANTIBIOTIC                       Culture Comment NO GROWTH AEROBICALLY/ANAEROBICALLY IN 5 DAYS  Result(s) reported on 31 May 2014 at 04:00PM.    15:51   Micro Text Report BLOOD CULTURE   COMMENT                   NO GROWTH AEROBICALLY/ANAEROBICALLY IN 5 DAYS   ANTIBIOTIC                       Culture Comment NO GROWTH AEROBICALLY/ANAEROBICALLY IN 5 DAYS  Result(s) reported on 31 May 2014 at 04:00PM.    19:44   Micro Text Report WOUND AER/ANAEROBIC CULT   COMMENT                   NO GROWTH AEROBICALLY/ANAEROBICALLY IN 4 DAYS   GRAM STAIN                FEW WHITE BLOOD CELLS   GRAM STAIN                NO ORGANISMS SEEN   ANTIBIOTIC                        Specimen Source L FOOT ULCER  Culture Comment NO GROWTH AEROBICALLY/ANAEROBICALLY IN 4 DAYS  Gram Stain 1 FEW WHITE BLOOD CELLS  Gram Stain 2 NO ORGANISMS SEEN  Result(s) reported on 30 May 2014 at 11:27AM.  08-Jul-15 15:16   Micro Text Report WOUND AER/ANAEROBIC CULT   ORGANISM 1                 RARE CANDIDA ALBICANS   COMMENT                   NO ANAEROBES ISOLATED IN 4 DAYS   GRAM STAIN                MANY WHITE BLOOD CELLS   GRAM STAIN                RARE GRAM NEGATIVE ROD   ANTIBIOTIC                    ORG#1       Micro Text Report FUNGUS CULTURE   ORGANISM 1  CANDIDA ALBICANS   COMMENT                   -   ANTIBIOTIC                    ORG#1       Organism Name Carney  Organism Name CANDIDA ALBICANS  Organism Quantity RARE  Specimen Source left heel;vanc,cleoc  Specimen Source left heel;vanc,cleoc  Organism 1 RARE CANDIDA ALBICANS  Organism 1 CANDIDA ALBICANS  Culture Comment NO ANAEROBES ISOLATED IN 4 DAYS  Culture Comment - (Result(s) reported on 31 May 2014 at 12:04PM.)  Gram Stain 1 MANY WHITE BLOOD CELLS  Gram Stain 2 RARE GRAM NEGATIVE ROD  Result(s) reported on 31 May 2014 at 10:31AM.  Culture Comment . ONCE ISOLATED  Result(s) reported on 29 May 2014 at 11:56AM.  11-Jul-15 14:09   Micro Text Report CLOSTRIDIUM DIFFICILE   C.DIFFICILE ANTIGEN       C.DIFFICILE GDH ANTIGEN : POSITIVE   C.DIFFICILE TOXIN A/B     C.DIFFICILE TOXINS A AND B : NEGATIVE   PCR FOR TOXIGENIC C.DIFF  PCR FOR TOXIGENIC C.DIFFICILE : NEGATIVE   INTERPRETATION Negative for toxigenic  C. difficile. Toxin gene and active toxin production not detected. May be a nontoxigenic strain of C. difficile bacteria present, lacking the ability to produce toxin.    ANTIBIOTIC                        Routine Chem:  07-Jul-15 15:42   Creatinine (comp)  1.33  08-Jul-15 05:41   Creatinine (comp) 1.20  10-Jul-15 03:35   Creatinine (comp) 1.05  11-Jul-15 03:30   Creatinine (comp) 0.85  12-Jul-15 03:58   Result Comment LABS - This specimen was collected through an   - indwelling catheter or arterial line.  - A minimum of 22ms of blood was wasted prior    - to collecting the sample.  Interpret  - results with caution.  Result(s) reported on 31 May 2014 at 05:07AM.   Glucose, Serum  166  BUN  21  Creatinine (comp) 0.97  Sodium, Serum 140  Potassium, Serum 4.0  Chloride, Serum 104  CO2, Serum 29  Calcium (Total), Serum 8.6  Anion Gap 7  Osmolality (calc) 286  eGFR (African American) >60  eGFR (Non-African American) >60 (eGFR values <678mmin/1.73 m2 may be an indication of chronic kidney disease (CKD). Calculated eGFR is useful in patients with stable renal function. The eGFR calculation will not be reliable in acutely ill patients when serum creatinine is changing rapidly. It is not useful in  patients on dialysis. The eGFR calculation may not be applicable to patients at the low and high extremes of body sizes, pregnant women, and vegetarians.)  Routine Hem:  12-Jul-15 03:58   WBC (CBC) 5.2  RBC (CBC)  3.49  Hemoglobin (CBC)  9.6  Hematocrit (CBC)  29.7  Platelet Count (CBC)  106  MCV 85  MCH 27.6  MCHC 32.4  RDW  14.7  Neutrophil % 77.7  Lymphocyte % 15.0  Monocyte % 6.1  Eosinophil % 0.6  Basophil % 0.6  Neutrophil # 4.1  Lymphocyte #  0.8  Monocyte # 0.3  Eosinophil # 0.0  Basophil # 0.0 (Result(s) reported on 31 May 2014 at 04:54AM.)   PERTINENT RADIOLOGY STUDIES: XRay:    07-Jul-15 14:39, Heel (Calcaneous) Left  Heel (Calcaneous) Left   REASON FOR EXAM:    ulcer  plantar left heel.  COMMENTS:       PROCEDURE: DXR - DXR CALCANEUOUS-HEEL LEFT  - May 26 2014  2:39PM     CLINICAL DATA:  Heel ulcer    EXAM:  DG HEEL 2V LEFT    COMPARISON:  MRI, 05/05/2014    FINDINGS:  No bone resorption is seen to suggest osteomyelitis. Plantar ulcer  extends to just below the plantar aspect of the tuberosity where  there is a moderate plantar calcaneal spur.    No fracture.  There is also a dorsal calcaneal spur.     IMPRESSION:  No radiographic evidence of osteomyelitis.      Electronically Signed    By: Lajean Manes M.D.    On: 05/26/2014 14:41         Verified By: Lasandra Beech, M.D.,    09-Jul-15 14:10, Chest  Portable Single View  Chest Portable Single View   REASON FOR EXAM:    check central line placement  COMMENTS:       PROCEDURE: DXR - DXR PORTABLE CHEST SINGLE VIEW  - May 28 2014  2:10PM     CLINICAL DATA:  Or status post PICC line placement    EXAM:  PORTABLE CHEST - 1 VIEW    COMPARISON:  None    FINDINGS:  The lungs are borderline hypoinflated. A right upper extremity PICC  line is been placed with the tip in the region of the midportion of  the SVC. There is no postprocedure complication. The heart is  top-normal in size. The pulmonary vascularity is normal. The patient  has undergone previous CABG.     IMPRESSION:  There is no postprocedure complication following placement of the  PICC line via the right upper extremity.      Electronically Signed    By: David  Martinique    On: 05/28/2014 14:12         Verified By: DAVID A. Martinique, M.D., MD  MRI:    16-Jun-15 12:06, MRI Foot Left Without Contrast  MRI Foot Left Without Contrast   REASON FOR EXAM:    non healing ulcer  COMMENTS:       PROCEDURE: MR  - MR FOOT LEFT  WO CONTRAST  - May 05 2014 12:06PM     CLINICAL DATA:  Nonhealing ulcer left heel.    EXAM:  MRI OF THE LEFT FOREFOOT WITHOUT CONTRAST    TECHNIQUE:  Multiplanar, multisequence MR imaging was performed. No intravenous  contrast was administered.    COMPARISON:  None.  FINDINGS:  There is extensive subcutaneous edema about the ankle and visualized  foot. Two air and fluid collections are seen in the subcutaneous  tissues along the medial aspect of the heel. The more cephalad  collection measures 1.5 x 1.3 x 1.1 cm. A more inferior collection  measures 1.3 x 1.7 x 0.8 cm. The collections are approximately 2.5-3  cm anterior to the posterior most margin of the calcaneus and are  confined to the subcutaneous tissues. There is no bone marrow signal  abnormality to suggest osteomyelitis. No joint effusion is seen.  Major ligaments and tendons are  unremarkable.     IMPRESSION:  Air and fluid collections in the subcutaneous tissues along the  medial aspect of the calcaneus are compatible with abscesses. No  evidence of osteomyelitis is identified.  Extensive and intense subcutaneous edema about the lower leg and  foot could be due to cellulitis and or dependent change.  Electronically Signed    By: Inge Rise M.D.    On: 05/05/2014 12:18         Verified By: Ramond Dial, M.D.,   Pertinent Past History:  Pertinent Past History Diabetes  Hypertension Rheumatoid arthritis   Hospital Course:  Hospital Course 1. Non healing diabetic foot ulcer on left heel- received Vanco and Meropenem, evaluated by ID; plan is for 4-6 wks IV abx. Prior MRI and recent X ray negative for osteomyelitis. Bld cx neg. Prelim wound cx negative. PICC in place 2. Diabetes, type 2 - Amaryl decreased from 8 to 4 mg daily. Continue Levemir, and continue same NovoLog SSI.  3. Hypertension: stable, continue current meds. 4. Rheumatoid arthritis/osteoarthritis- On chronic prednisone. Continue Tramadol and Lidocaine patch. 5. Diarrhea, chronic- C.diff neg, continue Imodium prn 6. Discharge to [facility].   Condition on Discharge Stable   DISCHARGE INSTRUCTIONS HOME MEDS:  Medication Reconciliation: Patient's Home Medications at Discharge:     Medication Instructions  prednisone 1 mg oral tablet  3 tab(s) orally once a day   prednisone 5 mg oral tablet  1 tab(s) orally once a day (in the morning) 8:00 am   aspirin low dose 81 mg oral tablet  1 tab(s) orally once a day   potassium gluconate 99 mg oral caplets  1 tab(s) orally once a day   lidocaine patch 5 % topical  Apply topically to affected area once a day   ferrous sulfate 325 mg oral tablet  1 tab(s) orally once a day   folic acid 1 mg oral tablet  1 tab(s) orally 2 times a day   novolin n human recombinant 100 units/ml subcutaneous suspension  26 unit(s) subcutaneous once a day    fish oil 1000 mg oral capsule  1 cap(s) orally once a day (at bedtime)   novolog 100 units/ml subcutaneous solution  16 unit(s) subcutaneous once a day   remicade 100 mg intravenous powder for injection   intravenous MONTHLY   multivitamin  1  orally once a day   cyanocobalamin 1000 mcg/ml injectable solution   injectable MONTHLY   ascorbic acid 1000 mg oral tablet  1 tab(s) orally once a day   leflunomide 10 mg oral tablet  1 tab(s) orally once a day   omeprazole 20 mg oral delayed release capsule  1 cap(s) orally 2 times a day   diltiazem 120 mg/24 hours oral capsule, extended release  1 cap(s) orally once a day   etodolac 500 mg oral tablet  1 tab(s) orally 2 times a day   tramadol 50 mg oral tablet  1 tab(s) orally every 6 hours   lisinopril 20 mg oral tablet  1 tab(s) orally once a day   glimepiride 4 mg oral tablet  1 tab(s) orally once a day   lorazepam 0.5 mg oral tablet  1 tab(s) orally once a day, As needed, anxiety   furosemide 40 mg oral tablet  1 tab(s) orally once a day   loperamide 2 mg oral capsule  1 cap(s) orally 3 times a day, As needed, diarrhea   meropenem  1 gram(s)  every 12 hours   lidocaine 5% topical film  1 patch topically once a day   vancomycin  1250 milligram(s)  every 18 hours   senna  1 tab(s) orally 2 times a day, As needed, constipation   docusate sodium 100 mg oral capsule  1 cap(s) orally 2 times a day, As needed, constipation  line flush - normal saline  5 milliliter(s) injectable once a day   line flush - normal saline  5 milliliter(s) injectable , As needed, line patency    STOP TAKING THE FOLLOWING MEDICATION(S):    levocetirizine 5 mg oral tablet: 1 tab(s) orally once a day (in the evening) cranberry - oral capsule:  orally once a day. Comments:  cadexomer iodine topical 0.9% topical gel: Apply topically to affected area 2 times a day levaquin 500 mg oral tablet: 1 tab(s) orally every 24 hours acetaminophen 500 mg oral tablet: 2 tab(s) orally  every 6 hours, As Needed - for Pain actonel 35 mg oral tablet: 1 tab(s) orally once a week (sunday)  Physician's Instructions:  Diet Carbohydrate Controlled (ADA) Diet   Activity Limitations As tolerated   Return to Work Not Applicable   Time frame for Follow Up Appointment 1-2 weeks  Dr. Ola Spurr   Time frame for Follow Up Appointment 1-2 weeks  Dr. Elvina Mattes   Time frame for Follow Up Appointment Dr. Dwyane Luo, Yeng Frankie(Attending Physician): [facility], [address], [place], [place] [address], [place]   Laray Anger): [facility], [address], [place], [place] [address], [place]   Adrian Prows Patrick(Consultant): [facility], Internal Medicine, [address] [place], [place] [address], [place] [phone]  Electronic Signatures: Glendon Axe (MD)  (Signed 13-Jul-15 13:52)  Authored: ADMISSION DATE AND DIAGNOSIS, CHIEF COMPLAINT/HPI, Allergies, PERTINENT LABS, PERTINENT RADIOLOGY STUDIES, PERTINENT PAST HISTORY, HOSPITAL COURSE, DISCHARGE INSTRUCTIONS HOME MEDS, PATIENT INSTRUCTIONS, Follow Up Physician   Last Updated: 13-Jul-15 13:52 by Glendon Axe (MD)

## 2015-03-13 NOTE — H&P (Signed)
PATIENT NAME:  Suzanne Ewing, Suzanne Ewing MR#:  [id number] DATE OF BIRTH:  1945/11/21  DATE OF ADMISSION:  05/26/2014  PRIMARY CARE PHYSICIAN: Glendon Axe, MD  REQUESTING PHYSICIAN: Samara Deist, DPM  CHIEF COMPLAINT: Nonhealing left heel ulcer.   HISTORY OF PRESENT ILLNESS: The patient is a 69 year old female with a known history of diabetes, GERD, hypertension, and nonhealing left heel ulcer who is being admitted for ongoing worsening of her left heel ulcer. This has been going on more 8 to 10 weeks now. She was feeling somewhat better after her last admission, from 15th of June until 22nd of June when she was discharged home to her sister's place. She was feeling better until the end of June, but for about a week now she has been feeling worse. Her wound is getting more uglier. She went to see Dr. Vickki Muff last Thursday, and he recommended close followup for which she had another followup this morning, and she was requested to come to the hospital for further evaluation and management.   PAST MEDICAL HISTORY: 1.  Rheumatoid arthritis. 2.  Diabetes.  3.  Lumbar spinal stenosis with neurogenic claudication. 4.  Chronic anemia. 5.  GERD. 6.  Nephrolithiasis. 7.  Hypertension.  8.  Non-healing left heel ulcer.   ALLERGIES: MACROBID, PENICILLIN, NITROGLYCERIN AND TAPE.   SOCIAL HISTORY: No smoking. She lives with her sister. She has 2 children and a granddaughter. She is a former smoker. No alcohol use.   FAMILY HISTORY: Not significant for her current condition/noncontributory.  REVIEW OF SYSTEMS: CONSTITUTIONAL: No fever. Positive for fatigue and weakness.  EYES: No blurred or double vision.  ENT: No tinnitus, ear pain.  RESPIRATORY: No cough, wheezing, or hemoptysis. CARDIOVASCULAR: No chest pain, orthopnea, edema. GASTROINTESTINAL: No nausea, vomiting, diarrhea.  GENITOURINARY: No dysuria or hematuria.  ENDOCRINE: No polyuria, nocturia. HEMATOLOGIC: Positive for chronic anemia. No easy  bruising or bleeding.  SKIN: She has a nonhealing left heel ulcer.  MUSCULOSKELETAL: Positive for arthritis and back pain.  NEUROLOGIC: No tingling, numbness or weakness. Positive for neuropathy. PSYCHIATRY: No history of anxiety or depression.   PHYSICAL EXAMINATION: VITAL SIGNS: Temperature 98.2, heart rate 88 per minute, respirations 18 per minute, blood pressure 121/60 mmHg, and she is saturating 97% on room air.  GENERAL: The patient is a 69 year old female lying in the bed comfortably without any acute distress.  EYES: Pupils equal, round, and reactive to light and accommodation. No scleral icterus. Extraocular muscles intact.  HEENT: Head atraumatic, normocephalic. Oropharynx and nasopharynx clear.  NECK: Supple. No jugular venous distention. No thyroid enlargement or tenderness.  LUNGS: Clear to auscultation bilaterally. No wheezing, rales, rhonchi or crepitation. CARDIOVASCULAR: S1, S2 normal. No murmurs, rubs or gallops.  ABDOMEN: Soft, nontender, nondistended. Bowel sounds present. No organomegaly or mass.  EXTREMITIES: Good pedal pulses. On the left lower extremity she has a dressing present. I did not open it, but she does have reported significant amount of nonhealing diabetic ulcer over her left heel. NEUROLOGIC: Cranial nerves II through XII intact. Muscle strength 5/5. Sensation intact.  PSYCHIATRIC: The patient is alert and oriented x3. MUSCULOSKELETAL: No joint effusion or tenderness.   DIAGNOSTIC DATA: Laboratory panel is pending.   Left heel x-ray on the 7th of July showed no evidence of osteomyelitis.   IMPRESSION AND PLAN: 1.  Left heel nonhealing ulcer, diabetic in nature, requiring further surgical care per podiatry request. We will consult Dr. Vickki Muff and consult infectious disease. We will start her on IV vancomycin and clindamycin  at this time. 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  2.  Gastroesophageal reflux disease. We will  continue her home medication. 3.  Anemia of chronic disease. We will check her labs and monitor her. No obvious signs of bleeding. 4.  Diabetes, insulin-dependent. We will start her on sliding scale insulin, monitor her blood sugar and resume her home regimen.   CODE STATUS: FULL code.   TOTAL TIME SPENT: 55 minutes.   ____________________________ Lucina Mellow. Manuella Ghazi, MD vss:sb D: 05/26/2014 15:50:28 ET T: 05/26/2014 16:05:10 ET JOB#: [id number]  cc: Kealy Lewter S. Manuella Ghazi, MD, <Dictator> Glendon Axe, MD Pete Glatter. Vickki Muff, DPM Lucina Mellow [facility] MD ELECTRONICALLY SIGNED 05/27/2014 13:11

## 2015-03-21 NOTE — Op Note (Signed)
PATIENT NAME:  Suzanne Ewing, Suzanne Ewing MR#:  [id number] DATE OF BIRTH:  1946-03-13  DATE OF PROCEDURE:  01/15/2015  PREOPERATIVE DIAGNOSIS: Left plantar heel ulceration with abscess.   POSTOPERATIVE DIAGNOSIS: Left plantar heel ulceration with abscess.   PROCEDURE: Incision and drainage with debridement to bone, left plantar heel.   SURGEON: Jasper Ruminski A. Vickki Muff, DPM.   ANESTHESIA: IV sedation with local.   HEMOSTASIS: Epinephrine 1:100,000 infiltrated along the incision sites.   COMPLICATIONS: None.   SPECIMEN: Wound culture for aerobic, anaerobic and fungal cultures.   ESTIMATED BLOOD LOSS: Minimal.   OPERATIVE INDICATIONS: This is a 69 year old female with a history of diabetes and a chronic left plantar heel ulceration. She has undergone conservative treatment for this heel ulceration for an extended amount of time. She presented to the office outpatient yesterday with an inflamed area on her left heel with a draining abscess. She was admitted at that time and antibiotics were started. She was brought into the OR today for operative I and D. The risks, benefits, alternatives and complications associated with the surgery were discussed with the patient and full informed consent has been given.   OPERATIVE PROCEDURE: The patient was brought into the OR and placed on the operating table in the supine position. IV sedation was administered by the anesthesia team. The left lower extremity was then prepped and draped in the usual sterile fashion. Attention was directed to the plantar and plantar lateral aspect of the left heel. There was noted to be an abscessed area to the lateral aspect of the heel. A small 2 cm incision was placed to the lateral heel. A fair amount of purulent drainage was noted. This abscessed area did track to the plantar aspect of the heel, where the previous ulceration was noted.   At this time, a longitudinal incision was made to the plantar aspect of the left heel. This was a  central midline splitting-type of ulcer. The deep previous open draining area was noted at this time. A full thickness incision was taken down to bone. All areas were flushed with copious amounts of irrigation with a bulb syringe. The plantar heel bone was noted to be intact without any soft areas. The lateral aspect of the abscessed area on the heel was then further dissected bluntly. There was a small tract extending up the lateral aspect of the heel towards the subtalar joint. It did not extend into the subtalar joint. I was able to express a fair amount of purulence from this area. Once again, this area was then bulb syringed and no further purulent drainage was noted.   At this time, the tracts were packed with sterile 1-inch Nu Gauze packing with iodoform. The plantar and lateral proximal and distal aspects of the incisions were closed with a 2-0 nylon. The central areas were left open for draining. She tolerated the procedure and anesthesia well and was transported from the OR to the PACU with all vital signs stable and neurovascular status intact. A large bulky dressing was placed to her left heel. She will be placed back on the floor and hopefully discharged in the next 1 to 2 days. I have asked infectious disease to evaluate for assistance with antibiotics.    ____________________________ Pete Glatter Vickki Muff, DPM jaf:TT D: 01/15/2015 12:32:28 ET T: 01/16/2015 11:19:42 ET JOB#: [id number]  cc: Larkin Ina A. Vickki Muff, DPM, <Dictator> Hilde Churchman DPM ELECTRONICALLY SIGNED 01/20/2015 14:17

## 2015-03-21 NOTE — Discharge Summary (Signed)
Dates of Admission and Diagnosis:  Date of Admission 14-Jan-2015   Date of Discharge 19-Jan-2015   Admitting Diagnosis Infected non-healing left foot diabetic ulcer, Rheumatoid arthritis, Type 2 diabetes, Hypertension, GERD   Final Diagnosis Infected non-healing left foot diabetic ulcer, Rheumatoid arthritis, Type 2 diabetes, Hypertension, GERD   Discharge Diagnosis 1 Infected non-healing left foot diabetic ulcer   2 Rheumatoid arthritis   3 Type 2 diabetes   4 Hypertension   5 GERD    Chief Complaint/History of Present Illness 69 year old lady with underlying rheumatoid arthritis, type 2 diabetes, hypertension presented with non-healing infected left foot ulcer.   Allergies:  Macrobid: Anaphylaxis  NTG: Other  Penicillin: Itching, Anaphylaxis, Resp. Distress  Macrodantin: Rash  Naproxen: Rash  Nitrofurantoin: Other  Morphine: Other  Biaxin: Other  Tape: Other    TDMs:  27-Feb-16 15:22   Vancomycin, Trough LAB 14 (Result(s) reported on 16 Jan 2015 at 03:55PM.)  Routine Micro:  26-Feb-16 11:48   Micro Text Report WOUND AER/ANAEROBIC CULT   COMMENT                   CONSISTENT WITH NORMAL FLORA   COMMENT                   NO ANAEROBES ISOLATED IN 4 DAYS   GRAM STAIN                FEW WHITE BLOOD CELLS   GRAM STAIN                NO ORGANISMS SEEN   ANTIBIOTIC                       Micro Text Report FUNGUS CULTURE   COMMENT                   NO YEAST OR FUNGUS ISOLATED IN 48 HOURS   ANTIBIOTIC                       Specimen Source LEFT FOOT  Specimen Source L FOOT ABSCESS  Culture Comment CONSISTENT WITH NORMAL FLORA  Culture Comment NO YEAST OR FUNGUS ISOLATED IN 48 HOURS  Result(s) reported on 17 Jan 2015 at 12:28PM.  Culture Comment . NO ANAEROBES ISOLATED IN 4 DAYS  Gram Stain 1 FEW WHITE BLOOD CELLS  Gram Stain 2 NO ORGANISMS SEEN  Result(s) reported on 19 Jan 2015 at 09:26AM.  29-Feb-16 10:40   Micro Text Report CLOSTRIDIUM DIFFICILE   C.DIFFICILE  ANTIGEN       C.DIFFICILE GDH ANTIGEN : POSITIVE   C.DIFFICILE TOXIN A/B     C.DIFFICILE TOXINS A AND B : NEGATIVE   PCR FOR TOXIGENIC C.DIFF  PCR FOR TOXIGENIC C.DIFFICILE : NEGATIVE   INTERPRETATION Negative for toxigenic  C. difficile. Toxin gene and active toxin production not detected. May be a nontoxigenic strain of C. difficile bacteria present, lacking the ability to produce toxin.    ANTIBIOTIC                        General Ref:  27-Feb-16 04:18   C-Reactive Protein ========== TEST NAME ==========  ========= RESULTS =========  = REFERENCE RANGE =  C-REACTIVE PROTEIN,QUANT  C-Reactive Protein, Quant C-Reactive Protein, Quant       [H  35.6 mg/L            ]  0.0-4.9               [facility]            No: [id number]           [address], [place], [place] [address]           Lindon Romp, MD         [phone]   Result(s) reported on 18 Jan 2015 at 05:49AM.  Routine Chem:  25-Feb-16 18:15   Glucose, Serum -  BUN -  Creatinine (comp) -  Sodium, Serum -  Potassium, Serum -  Chloride, Serum -  CO2, Serum -  Calcium (Total), Serum -  Anion Gap -  Osmolality (calc) -  eGFR (African American) -  eGFR (Non-African American) - (eGFR values <73m/min/1.73 m2 may be an indication of chronic kidney disease (CKD). Calculated eGFR, using the MRDR Study equation, is useful in  patients with stable renal function. The eGFR calculation will not be reliable in acutely ill patients when serum creatinine is changing rapidly. It is not useful in patients on dialysis. The eGFR calculation may not be applicable to patients at the low and high extremes of body sizes, pregnant women, and vegetarians.)  Hemoglobin A1c ([facility])  8.8 (The American Diabetes Association recommends that a primary goal of therapy should be <7% and that physicians should reevaluate the treatment regimen in patients with HbA1c values consistently >8%.)  Result Comment METB -  CANCELED DUE TO POSSIBLE CONTAMINATION  - CALLED VASHANDA SAUFF-BROWN AT 1925  - ON 01/14/15 BY [facility]  Result(s) reported on 14 Jan 2015 at 06:36PM.    19:40   Glucose, Serum  356  BUN  22  Creatinine (comp) 1.23  Sodium, Serum 139  Potassium, Serum 4.1  Chloride, Serum 103  CO2, Serum 27  Calcium (Total), Serum 8.6  Anion Gap 9  Osmolality (calc) 295  eGFR (African American)  56  eGFR (Non-African American)  46 (eGFR values <640mmin/1.73 m2 may be an indication of chronic kidney disease (CKD). Calculated eGFR, using the MRDR Study equation, is useful in  patients with stable renal function. The eGFR calculation will not be reliable in acutely ill patients when serum creatinine is changing rapidly. It is not useful in patients on dialysis. The eGFR calculation may not be applicable to patients at the low and high extremes of body sizes, pregnant women, and vegetarians.)  Routine Hem:  25-Feb-16 18:15   WBC (CBC)  11.4  RBC (CBC)  3.34  Hemoglobin (CBC)  9.2  Hematocrit (CBC)  28.5  Platelet Count (CBC) 192  MCV 85  MCH 27.6  MCHC 32.3  RDW  15.4  Neutrophil % 87.1  Lymphocyte % 7.0  Monocyte % 5.2  Eosinophil % 0.3  Basophil % 0.4  Neutrophil #  9.9  Lymphocyte #  0.8  Monocyte # 0.6  Eosinophil # 0.0  Basophil # 0.0 (Result(s) reported on 14 Jan 2015 at 06:36PM.)   PERTINENT RADIOLOGY STUDIES: XRay:    29-Feb-16 18:33, Chest 1 View AP or PA  Chest 1 View AP or PA   REASON FOR EXAM:    PLACEMENT OF PICC LINE  COMMENTS:       PROCEDURE: DXR - DXR CHEST 1 VIEWAP OR PA  - Jan 18 2015  6:33PM     CLINICAL DATA:  PICC line placement    EXAM:  CHEST  1 VIEW    COMPARISON:  05/28/2014    FINDINGS:  Left-sided PICC lineplaced with tip over the cavoatrial junction.  Evidence of CABG. Mild enlargement of the cardiac silhouette  reidentified without evidence for edema. No pneumothorax. No pleural  effusion.     IMPRESSION:  Left-sided PICC line with tip over  the cavoatrial junction.      Electronically Signed    By: Conchita Paris M.D.    On: 01/18/2015 19:11         Verified By: Arline Asp, M.D.,   Pertinent Past History:  Pertinent Past History Rheumatoid arthritis Type 2 diabetes Hypertension GERD Non healing left foot diabetic ulcer   Hospital Course:  Hospital Course 69 year old lady was admitted with nonhealing Left foot ulcer. Patient underwent I&D on 01/15/15. She received broad spectrum antibiotic coverage with IV Meropenem and Vancomycin. Prednisone 16m daily and Etodolac 5037mtwice a day were resumed during the hospitalization for painful joint inflammation. Patient understands this could delay proper healing. Same was discussed with podiatry. Diabetes and hypertension remained controlled during her stay. Mildly drop in H/H after surgery likely secondary to intra-operative blood loss and dilution. Patient was evaluated by ID. Preliminary Wound Culture grew normal flora otherwise negative. Patient is to be discharged home on IV Rocephin 2 grams Q 24 hours and Flagyl orally for two weeks, per ID recommendations. Weekly labs for CBC, CMP and CRP. Follow up with ID in 2 weeks   Condition on Discharge Satisfactory   DISCHARGE INSTRUCTIONS HOME MEDS:  Medication Reconciliation: Patient's Home Medications at Discharge:     Medication Instructions  potassium gluconate 99 mg oral caplets  1 tab(s) orally once a day   ferrous sulfate 325 mg oral tablet  1 tab(s) orally once a day   folic acid 1 mg oral tablet  1 tab(s) orally 2 times a day   novolin n human recombinant 100 units/ml subcutaneous suspension  26 unit(s) subcutaneous once a day   fish oil 1000 mg oral capsule  1 cap(s) orally once a day (at bedtime)   remicade 100 mg intravenous powder for injection   intravenous MONTHLY   cyanocobalamin 1000 mcg/ml injectable solution   injectable MONTHLY   omeprazole 20 mg oral delayed release capsule  1 cap(s) orally 2 times  a day   etodolac 500 mg oral tablet  1 tab(s) orally 2 times a day   lisinopril 20 mg oral tablet  1 tab(s) orally once a day   lorazepam 0.5 mg oral tablet  1 tab(s) orally once a day, As needed, anxiety   diltiazem 120 mg/24 hours oral capsule, extended release  1 cap(s) orally once a day   furosemide 40 mg oral tablet  1 tab(s) orally once a day   ascorbic acid 1000 mg oral tablet  1 tab(s) orally once a day   cadexomer iodine topical 0.9% topical gel  Apply topically to affected area once a day   caltrate 600 + d 600 mg-800 intl units oral tablet  1 tab(s) orally 2 times a day   centrum silver women's therapeutic multiple vitamins with minerals oral tablet  1 tab(s) orally once a day   cranberry - oral capsule  500 milligram(s) orally once a day   glimepiride 4 mg oral tablet  2 tab(s) orally once a day   hydroxychloroquine 200 mg oral tablet  1 tab(s) orally once a day   novolog 100 units/ml subcutaneous solution  12 unit(s) subcutaneous  at supper   lidoderm 5% topical film  Apply topically to  affected area once a day   levocetirizine 5 mg oral tablet  1 tab(s) orally once a day (in the evening)   tramadol 50 mg oral tablet  1 tab(s) orally every 4 hours as needed   prednisone 5 mg oral tablet  5 mg at 0974m at 1500   leflunomide 20 mg oral tablet  1 tab(s) orally once a day   novolog 100 units/ml subcutaneous solution  4 unit(s) subcutaneous at lunch   metronidazole 500 mg oral tablet  1 tab(s) orally every 8 hours   ondansetron 4 mg oral tablet  1 tab(s) orally 3 times a day, As Needed, nausea , As needed, nausea   ceftriaxone  2 gram(s) intravenous every 24 hours   nicotine 7 mg/24 hr transdermal film, extended release  1 patch transdermal once a day     Physician's Instructions:  Home Health? Yes   HElloreeInstructions IV antibiotic and labs as instructed   Dressing Care per podiatry   Diet Low Sodium  Carbohydrate  Controlled (ADA) Diet   Activity Limitations As tolerated   Return to Work Not Applicable   [facility]   Time frame for Follow Up Appointment 2 weeks for podiatry   Time frame for Follow Up Appointment Dr. JGlendon Axe  Other Comments Discharge home on IV Ceftriaxone 2 gm Q 24 hours and Flagyl orally for two weeks. Weekly CBC, CMP and CRP. Follow up with ID in 2 weeks     FVickki Muff Justin(Ordered): [facility] [address] [place] [id number] [place] JPillowPhysician): K[facility] [address] [place] [id number] [place]  FAdrian ProwsPatrick(Consultant): [facility], [address] BSt[facility] [address] [phone]  Electronic Signatures: SGlendon Axe(MD)  (Signed 01-Mar-16 14:05)  Authored: ADMISSION DATE AND DIAGNOSIS, CHIEF COMPLAINT/HPI, Allergies, PERTINENT LABS, PERTINENT RADIOLOGY STUDIES, PERTINENT PAST HISTORY, HFort Jones PATIENT INSTRUCTIONS, Follow Up Physician   Last Updated: 01-Mar-16 14:05 by SGlendon Axe(MD)

## 2015-03-21 NOTE — H&P (Signed)
PATIENT NAME:  Suzanne Ewing, RESS MR#:  [id number] DATE OF BIRTH:  [birth year]  DATE OF ADMISSION:  01/14/2015  PRIMARY CARE PHYSICIAN:   Glendon Axe, MD.    REFERRING PHYSICIAN:  Samara Deist, MD.    CHIEF COMPLAINT:  Left foot infection.   HISTORY OF PRESENT ILLNESS: A 69 year old Caucasian female with a history of diabetes, hypertension, and chronic left foot nonhealing ulcer with infection, was sent from Dr. Alvera Singh office for direct admission due to left foot infection. The patient is alert, awake, oriented, in no acute distress. The patient said she has a nonhealing left chronic ulcer. The patient had a nonhealing left heel ulcer, which was infected, she was treated with IV antibiotics for 6 weeks. The left foot infection was better. The patient was fine until today. The patient found some discharge from her left foot, she called Dr. Vickki Muff and went to Dr. Alvera Singh office.  Dr. Vickki Muff suggested the patient needed to be admitted and will get a procedure, so the patient was sent to the hospital for direct admission. The patient denies any fever or chills. No headache or dizziness or weakness, but complains of left foot swelling, erythema and tenderness with some discharge.   PAST MEDICAL HISTORY: Left nonhealing left heel ulcer, hypertension, diabetes, GERD, rheumatoid arthritis, chronic anemia, lumbar spine stenosis with neurogenic claudication, nephrolithiasis.   SOCIAL HISTORY: No smoking or drinking or illicit drugs, but the patient was a former smoker.   FAMILY HISTORY: No significant for her current condition.   ALLERGIES: MACROBID, PENICILLIN, NITROGLYCERIN, AND TAPE, ALSO BIAXIN, MACRODANTIN, MORPHINE, NAPROXEN, NITROFURANTOIN, PENICILLIN.    HOME MEDICATIONS:  Medication reconciliation list is not done yet, we will update later.  REVIEW OF SYSTEMS:  CONSTITUTIONAL: The patient denies any fever or chills. No headache or dizziness. No weakness.   EYES:  No double vision or blurred  vision.    EARS, NOSE, AND THROAT: No postnasal drip, slurred speech, or dysphagia.  CARDIOVASCULAR: No chest pain, palpitation, orthopnea, or nocturnal dyspnea, but has leg edema.  PULMONARY: No cough, sputum, shortness of breath, or hematemesis.  GASTROINTESTINAL: No abdominal pain, nausea, vomiting, but has chronic diarrhea due to IBS. No melena or bloody stool.  GENITOURINARY: No dysuria, hematuria, or incontinence.  SKIN: No rash or jaundice.  NEUROLOGIC: No syncope, loss of consciousness, or seizure.  ENDOCRINE: No polyuria, polydipsia, heat or cold intolerance.  HEMATOLOGIC: No easy bleeding or bleeding.   PHYSICAL EXAMINATION:  VITAL SIGNS: Temperature 98.1, blood pressure of 128/75, pulse 99, O2 saturation 93% on room air.  GENERAL: The patient is alert, awake, oriented, in no acute distress, obese.  HEENT: Pupils round, equal, and reactive to light and accommodation. Moist oral mucosa. Clear oropharynx.  NECK: Supple. No JVD or carotid bruits are noticed. No lymphadenopathy. No thyromegaly.  CARDIOVASCULAR: S1, S2, regular rate and rhythm. No murmurs, gallops.  PULMONARY: Bilateral air entry. No wheezing or rales. No use of accessory muscles to breathe.  ABDOMEN: Soft. No distention. No tenderness. No organomegaly. Bowel sounds present.  EXTREMITIES: Bilateral lower extremity edema especially on the left side. Left foot and ankle in dressing with erythema, swelling, and tenderness, and warmness. No pedal pulses present.  SKIN: No rash or jaundice.  NEUROLOGIC: A and O x 3. No focal deficit. Power 5 out of 5. Sensory intact.   LABORATORY DATA: Glucose 238, BUN 20, creatinine 1.13, sodium 133, chloride 102, bicarbonate 22. WBC 11.4, hemoglobin 9.2, platelets 192,000. Hemoglobin A1c 8.8.  MRI of her left  ankle showed ulceration without drainable abscess or osteomyelitis, plantar fasciitis.    IMPRESSIONS:  1.  Left infection with cellulitis and possible abscess.  2.  Hypertension.   3.  Diabetes.  4.  Obesity.   PLAN OF TREATMENT:  1.  The patient will keep n.p.o. for possible I and D tomorrow by Dr. Vickki Muff. I will start meropenem and vancomycin IV PB. Follow up a CBC.  2.  For diabetes I will start sliding scale, continue the patient's home diabetes medication.  3.  For mild dehydration I will give gentle rehydration.  4.  For hypertension we will continue the patient's home hypertension medication.  5.  For tobacco abuse the patient was counseled for smoking cessation for 3-4 minutes. We will give a nicotine patch.  6.  I discussed the patient's condition and plan of treatment with the patient and the patient's husband.   TIME SPENT: About 56 minutes.      ____________________________ Demetrios Loll, MD qc:bu D: 01/14/2015 19:25:22 ET T: 01/14/2015 19:43:59 ET JOB#: [id number]  cc: Demetrios Loll, MD, <Dictator> Demetrios Loll MD ELECTRONICALLY SIGNED 01/16/2015 19:55

## 2015-03-21 NOTE — Consult Note (Signed)
PATIENT NAME:  Suzanne Ewing, Suzanne Ewing MR#:  [id number] DATE OF BIRTH:  05-08-1946  DATE OF CONSULTATION:  01/15/2015  REFERRING PHYSICIAN:     Larkin Ina A. Vickki Muff, DPM CONSULTING PHYSICIAN:  Cheral Marker. Ola Spurr, MD  INFECTIOUS DISEASE CONSULTATION  REASON FOR CONSULTATION: Heel infection and nonhealing ulcer.   HISTORY OF PRESENT ILLNESS: This is a pleasant 69 year old female with a complicated past medical history, including diabetes, hypertension, rheumatoid arthritis. She presented last year with a chronic nonhealing left ulcer on her heel. She was initially admitted in June 2015 with this infection and underwent debridement. She has had polymicrobial infection most notably with Proteus and enterococcus. She underwent surgical debridement, followed by a prolonged IV antibiotic course, followed by oral antibiotics. When I last saw her in September 2015 her wound was mainly healed and had no active infection. She remained stable until the last several days, when she developed increasing redness, swelling, and drainage from the lateral foot. She also had some increased redness going up her foot. She was seen by Dr. Vickki Muff and direct admitted. She underwent debridement on 02/26 with I and D with debridement to the bone. Cultures are pending. We are consulted for further antibiotic recommendations.   PAST MEDICAL HISTORY:  1.  Rheumatoid arthritis.  2.  Diabetes.  3.  Peripheral neuropathy.  4.  Nonhealing left heel ulcer as above.  5.  Chronic anemia.  6.  Lumbar spinal stenosis with neurogenic claudication.  7.  Nephrolithiasis.   PAST SURGICAL HISTORY: Debridement of left heel as above.   SOCIAL HISTORY: Does not smoke or drink. She lives at home and is well cared for by her family.   FAMILY HISTORY: Noncontributory.   ALLERGIES: MACROBID, PENICILLIN, NITROGLYCERIN, BIAXIN, MACRODANTIN, MORPHINE, NAPROSYN.   ANTIBIOTICS SINCE ADMISSION: Include meropenem and vancomycin.   REVIEW OF SYSTEMS: Eleven  systems reviewed and negative except as per HPI.   PHYSICAL EXAMINATION:  VITAL SIGNS: Temperature 98.4, pulse 97, blood pressure 120/67, respirations 18, saturation 93% on room air.  GENERAL: She is chronically ill-appearing, lying in bed, obese, in no acute distress.  HEENT: Pupils reactive. Sclerae are anicteric. Oropharynx clear.  NECK: Supple.  HEART: Regular.  LUNGS: Clear.  ABDOMEN: Soft, nontender, nondistended.  EXTREMITIES: Her left foot is wrapped postoperatively. There is some erythema of the leg. The leg is also somewhat warm and mildly tender.  NEUROLOGIC: She is alert and oriented x 3. Grossly nonfocal neurologic exam.   LABORATORY DATA: White blood count on admission 11.4, on 02/25 at 8.8, and on 02/26 renal function shows an estimated GFR of 54 with a creatinine of 1.07. Hemoglobin 9.1, platelets 185,000. Wound culture from 02/25 is pending.   MRI of the foot shows evidence of ulceration lateral to and below the calcaneus without a  drainable abscess or osteomyelitis.   IMPRESSION: A 69 year old female with a history of diabetes with A1c of 8.1, as well as rheumatoid arthritis on immunomodulatory medications with a chronic nonhealing ulcer for almost a year. This had almost healed back in September, but does seem to have flared most recently with lateral draining sinus tract. Cultures are pending. She did have some cellulitis spreading up the legs. SHE IS ALLERGIC TO PENICILLIN.   RECOMMENDATIONS:  1.  Continue meropenem and vancomycin pending cultures.  2.  I think she will likely need a PICC line for at least 2 weeks of IV antibiotics given her comorbidities and poor healing abilities.  3.  Further antibiotic recommendations based on her culture  results.  4.  Check ESR and CRP, which have been ordered.  Thank you for the consult. I will be glad to follow with you.    ____________________________ Cheral Marker. Ola Spurr, MD dpf:ts D: 01/15/2015 19:45:00 ET T: 01/15/2015  23:39:13 ET JOB#: [id number]  cc: Cheral Marker. Ola Spurr, MD, <Dictator> Camiyah Friberg Ola Spurr MD ELECTRONICALLY SIGNED 01/21/2015 19:54

## 2015-03-21 NOTE — Consult Note (Signed)
Admit Diagnosis:   LT FOOT INFECTION HX OF MRSA..: Onset Date: 15-Jan-2015, Status: Active, Description: LT FOOT INFECTION HX OF MRSA..    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:    staph debridement left foot: Jul 2005   carpal tunnel bilateral wrist:    right breast bx:    incisional hernia repair:    ovarian tumor:    cholecystectomy:   Home Medications: Medication Instructions Status  traMADol 50 mg oral tablet 1 tab(s) orally every 4 hours as needed Active  glimepiride 4 mg oral tablet 2 tab(s) orally once a day Active  furosemide 40 mg oral tablet 1 tab(s) orally once a day Active  etodolac 500 mg oral tablet 1 tab(s) orally 2 times a day Active  lisinopril 20 mg oral tablet 1 tab(s) orally once a day Active  LORazepam 0.5 mg oral tablet 1 tab(s) orally once a day, As needed, anxiety Active  diltiazem 120 mg/24 hours oral capsule, extended release 1 cap(s) orally once a day Active  potassium gluconate 99 mg oral caplets 1 tab(s) orally once a day Active  ferrous sulfate 325 mg oral tablet 1 tab(s) orally once a day Active  folic acid 1 mg oral tablet 1 tab(s) orally 2 times a day Active  NovoLIN N human recombinant 100 units/mL subcutaneous suspension 26 unit(s) subcutaneous once a day Active  Fish Oil 1000 mg oral capsule 1 cap(s) orally once a day (at bedtime) Active  Remicade 100 mg intravenous powder for injection  intravenous MONTHLY Active  cyanocobalamin 1000 mcg/mL injectable solution  injectable MONTHLY Active  omeprazole 20 mg oral delayed release capsule 1 cap(s) orally 2 times a day Active  ascorbic acid 1000 mg oral tablet 1 tab(s) orally once a day Active  cadexomer  iodine topical 0.9% topical gel Apply topically to affected area once a day Active  Caltrate 600 + D 600 mg-800 intl units oral tablet 1 tab(s) orally 2 times a day Active  Centrum Silver Women's Therapeutic Multiple Vitamins with Minerals oral tablet 1 tab(s) orally once a day Active  Cranberry - oral capsule 500 milligram(s) orally once a day Active  predniSONE 5 mg oral tablet 5 mg at 0900 5 mg at 1500 Active  leflunomide 20 mg oral tablet 1 tab(s) orally once a day Active  hydroxychloroquine 200 mg oral tablet 1 tab(s) orally once a day Active  NovoLOG 100 units/mL subcutaneous solution 4 unit(s) subcutaneous at lunch Active  NovoLOG 100 units/mL subcutaneous solution 12 unit(s) subcutaneous  at supper Active  Lidoderm 5% topical film Apply topically to affected area once a day Active  levocetirizine 5 mg oral tablet 1 tab(s) orally once a day (in the evening) Active   Lab Results: Routine Chem:  25-Feb-16 19:40   Glucose, Serum  356  26-Feb-16 04:11   Glucose, Serum  53  BUN  21  Creatinine (comp) 1.07  Routine Hem:  26-Feb-16 04:11   WBC (CBC) 8.8  Hemoglobin (CBC)  9.1  Hematocrit (CBC)  29.1   Radiology Results:  Radiology Results: MRI:  25-Feb-16 17:33, MRI Ankle Left Without Contrast  MRI Ankle Left Without Contrast  REASON FOR EXAM:    Ulcer with abscess.  Concern for osteomyelitis.  COMMENTS:       PROCEDURE: MR  - MR ANKLE LT  WO CONTRAST  - Jan 14 2015  5:33PM     CLINICAL DATA:  Lateral ankle wound, query osteomyelitis. Prior  ankle surgery. Swelling, erythema, and pain.    EXAM:  MRI OF THE LEFT ANKLE WITHOUT CONTRAST    TECHNIQUE:  Multiplanar, multisequence MR imaging of the ankle was performed. No  intravenous contrast was administered.  COMPARISON:  05/26/2014 ; 05/05/2014    FINDINGS:  TENDONS    Peroneal: Unremarkable    Posteromedial: Prominently attenuated/thinned tibialis posterior  tendon at and distal to the level of the medial  malleolus,  prominently attenuated or torn.    Anterior: Unremarkable    Achilles: Abnormal posterior expansion of the Achilles tendon over a  4.4 cm excursion centered 7 cm proximal to the insertion site,  possibly representing debris/ synovitis in the tendon sheath rather  than necessarily being from tendinopathy. Similar appearance  previously.    Plantar Fascia: Thickened medial band of the plantar fascia with  edema along the adjacent plantar calcaneal spur.    LIGAMENTS    Lateral: Calcaneofibular ligament not well visualized.    Medial: Grossly intact    CARTILAGE  Ankle Joint: Degenerative chondral thinning.    Subtalar Joints/Sinus Tarsi: Degenerative chondral thinning.    Bones: Flatfoot deformity. Mild chronic fragmentation of the lateral  process of the talus as on images 22 through 25 of series 5 and also  shown on image 23 of series 6. Low-level edema anterolaterally along  the distal tibia.    Additional soft tissues: Extensive subcutaneous edema dorsally along  the ankle and along the dorsal foot, and also in the distal calf and  tracking over the malleolus. No underlying osteomyelitis. Suspected  skin blistering or ulceration lateral to the calcaneus as on image  36 of series 4. There is some confluent infiltrative edema along the  inferolateral margin of the calcaneus in the subcutaneous tissues,  and probable ulceration below the calcaneus on image 11 of series 6.  A well-defined drainable abscess is not seen.     IMPRESSION:  1. Ulceration unlikely blistering lateral to and below the  calcaneus, without a drainable abscess or osteomyelitis observed.  2. Prominently attenuated/thinned tibialis posterior tendon, at  least partially torn.  3. Abnormal appearance of the Achilles tendon, with suspected debris  or synovitis along the tendon sheath posteriorly.  4. Plantar fasciitis with adjacent plantar calcaneal spur.  5. Pes planus.  6. Poor  visualization of the calcaneofibular ligament.  7. Degenerative chondral thinning in the tibiotalar joint and  subtalar joints.  Electronically Signed    By: Van Clines M.D.    On: 01/14/2015 17:52         Verified By: Carron Curie, M.D.,    Macrobid: Anaphylaxis  NTG: Other  Penicillin: Itching, Anaphylaxis, Resp. Distress  Macrodantin: Rash  Naproxen: Rash  Nitrofurantoin: Other  Morphine: Other  Biaxin: Other  Tape: Other  Nursing Flowsheets: **Vital Signs.:   26-Feb-16 07:40  Temperature Source oral    General Aspect Pt admitted from outpt clinic with left foot infection. Well known to me.  Longstanding history of non-healing left heel ulceration.  Developed redness and swelling with drainage to lateral left foot ~3 days ago.  Seen outpt  clinic with draining abscess yesterday and pt has hx of MRSA and immunocompromised on  multiple RA meds.  Admitted for I & D and antibiotics.   Case History and Physical Exam:  Cardiovascular palpable dp/pt pulses.   Musculoskeletal Edema to left lower leg   Neurological Mild decreased sensation left foot   Skin Noted eythema to lateral left heel with draining abscess.  Plantar heel ulcer probes deep to subq.    Impression Left foot abscess. DM with neuropathy. RA. Hx of MRSA   Plan MRI is negative for osteomyelitis. Will plan for I & D left foot. Will do cultures in OR. Will ask ID to assist as pt has hx of MRSA on multiple RA medications.   Electronic Signatures: Samara Deist (MD)  (Signed [phone] 11:19)  Authored: Health Issues, Significant Events - History, Home Medications, Labs, Radiology Results, Allergies, Vital Signs, General Aspect/Present Illness, History and Physical Exam, Impression/Plan   Last Updated: 26-Feb-16 11:19 by Samara Deist (MD)

## 2015-05-10 ENCOUNTER — Ambulatory Visit (HOSPITAL_COMMUNITY)
Admission: RE | Admit: 2015-05-10 | Discharge: 2015-05-10 | Disposition: A | Discharge status: Home / Self Care | Payer: Medicare Other | Source: Ambulatory Visit | Attending: Plastic Surgery | Admitting: Plastic Surgery

## 2015-05-10 ENCOUNTER — Encounter (HOSPITAL_BASED_OUTPATIENT_CLINIC_OR_DEPARTMENT_OTHER): Payer: Medicare Other | Attending: Plastic Surgery

## 2015-05-10 ENCOUNTER — Other Ambulatory Visit (HOSPITAL_COMMUNITY): Payer: Self-pay | Admitting: Plastic Surgery

## 2015-05-10 DIAGNOSIS — E11621 Type 2 diabetes mellitus with foot ulcer: Secondary | ICD-10-CM | POA: Insufficient documentation

## 2015-05-10 DIAGNOSIS — Z794 Long term (current) use of insulin: Secondary | ICD-10-CM | POA: Diagnosis not present

## 2015-05-10 DIAGNOSIS — I252 Old myocardial infarction: Secondary | ICD-10-CM | POA: Insufficient documentation

## 2015-05-10 DIAGNOSIS — Z87891 Personal history of nicotine dependence: Secondary | ICD-10-CM | POA: Diagnosis not present

## 2015-05-10 DIAGNOSIS — M8580 Other specified disorders of bone density and structure, unspecified site: Secondary | ICD-10-CM | POA: Insufficient documentation

## 2015-05-10 DIAGNOSIS — E1121 Type 2 diabetes mellitus with diabetic nephropathy: Secondary | ICD-10-CM

## 2015-05-10 DIAGNOSIS — Z880 Allergy status to penicillin: Secondary | ICD-10-CM | POA: Diagnosis not present

## 2015-05-10 DIAGNOSIS — Z885 Allergy status to narcotic agent status: Secondary | ICD-10-CM | POA: Insufficient documentation

## 2015-05-10 DIAGNOSIS — E1169 Type 2 diabetes mellitus with other specified complication: Secondary | ICD-10-CM | POA: Insufficient documentation

## 2015-05-10 DIAGNOSIS — R2242 Localized swelling, mass and lump, left lower limb: Secondary | ICD-10-CM | POA: Diagnosis not present

## 2015-05-10 DIAGNOSIS — L97521 Non-pressure chronic ulcer of other part of left foot limited to breakdown of skin: Secondary | ICD-10-CM | POA: Insufficient documentation

## 2015-05-10 DIAGNOSIS — I10 Essential (primary) hypertension: Secondary | ICD-10-CM | POA: Diagnosis not present

## 2015-05-10 DIAGNOSIS — M069 Rheumatoid arthritis, unspecified: Secondary | ICD-10-CM | POA: Diagnosis not present

## 2015-05-10 DIAGNOSIS — L97421 Non-pressure chronic ulcer of left heel and midfoot limited to breakdown of skin: Secondary | ICD-10-CM | POA: Insufficient documentation

## 2015-05-14 ENCOUNTER — Other Ambulatory Visit: Payer: Self-pay

## 2015-05-14 DIAGNOSIS — L97929 Non-pressure chronic ulcer of unspecified part of left lower leg with unspecified severity: Principal | ICD-10-CM

## 2015-05-14 DIAGNOSIS — I83029 Varicose veins of left lower extremity with ulcer of unspecified site: Secondary | ICD-10-CM

## 2015-06-02 ENCOUNTER — Encounter: Payer: Self-pay | Admitting: Surgery

## 2015-06-04 ENCOUNTER — Ambulatory Visit (INDEPENDENT_AMBULATORY_CARE_PROVIDER_SITE_OTHER): Payer: Medicare Other | Admitting: Surgery

## 2015-06-04 ENCOUNTER — Encounter: Payer: Self-pay | Admitting: Surgery

## 2015-06-04 ENCOUNTER — Ambulatory Visit (HOSPITAL_COMMUNITY)
Admission: RE | Admit: 2015-06-04 | Discharge: 2015-06-04 | Disposition: A | Discharge status: Home / Self Care | Payer: Medicare Other | Source: Ambulatory Visit | Attending: Surgery | Admitting: Surgery

## 2015-06-04 VITALS — BP 138/71 | HR 93 | Resp 14 | Ht 66.0 in | Wt 176.0 lb

## 2015-06-04 DIAGNOSIS — I87312 Chronic venous hypertension (idiopathic) with ulcer of left lower extremity: Secondary | ICD-10-CM

## 2015-06-04 DIAGNOSIS — L97924 Non-pressure chronic ulcer of unspecified part of left lower leg with necrosis of bone: Secondary | ICD-10-CM | POA: Diagnosis not present

## 2015-06-04 DIAGNOSIS — I83029 Varicose veins of left lower extremity with ulcer of unspecified site: Secondary | ICD-10-CM

## 2015-06-04 DIAGNOSIS — L97929 Non-pressure chronic ulcer of unspecified part of left lower leg with unspecified severity: Secondary | ICD-10-CM

## 2015-06-04 NOTE — Progress Notes (Signed)
Patient name: Suzanne Ewing MRN: [id number] DOB: 27-Dec-1945 Sex: female   Referred by: [facility] Center  Reason for referral:  Chief Complaint  Patient presents with  . New Evaluation    wound to left heel    HISTORY OF PRESENT ILLNESS: This is a 69 year old female who was referred for evaluation of left leg wounds.  The patient states that she has had a wound on her heel for approximately one year.  It has been depleted and packed many times.  She has been on and off anti-biotics.  She has also developed an ulcer on the lateral side of her left foot.  This is been there since March.  She is currently undergoing nonweightbearing as well as topical dressing changes.  The patient does report being on immunosuppression therapy up until 1 year ago and thinks that this may have some impact on her nonhealing.  She reports having a foot doctor as well as an Doctor, general practice in [place] which she visits.  The patient suffers from coronary artery disease.  She has undergone CABG at [facility].  She is medically managed for diabetes.  Past Medical History  Diagnosis Date  . Arthritis   . Anemia   . GERD (gastroesophageal reflux disease)   . Heart disease   . Hypertension   . Nephrolithiasis   . Heel ulcer   . Diabetes mellitus without complication   . Hyperlipidemia     Past Surgical History  Procedure Laterality Date  . Coronary artery bypass graft    . Back surgery    . Cholecystectomy    . Shoulder surgery    . Knee surgery Left   . Carpal tunnel release Bilateral     History   Social History  . Marital Status: Married    Spouse Name: N/A  . Number of Children: N/A  . Years of Education: N/A   Occupational History  . Not on file.   Social History Main Topics  . Smoking status: Former Smoker    Quit date: 06/03/2014  . Smokeless tobacco: Never Used  . Alcohol Use: 0.0 oz/week    0 Standard drinks or equivalent per week  . Drug Use: No  . Sexual Activity: Not on  file   Other Topics Concern  . Not on file   Social History Narrative  . No narrative on file    Family History  Problem Relation Age of Onset  . Stroke Mother     Allergies as of 06/04/2015 - Review Complete 06/04/2015  Allergen Reaction Noted  . Macrobid [nitrofurantoin monohyd macro] Itching, Rash, and Swelling 06/04/2015  . Penicillins Shortness Of Breath and Rash 06/04/2015  . Tape Rash 06/04/2015  . Clarithromycin Other (See Comments) 06/04/2015  . Nitroglycerin Other (See Comments) 06/04/2015  . Leflunomide Rash 06/04/2015  . Morphine Nausea And Vomiting 06/04/2015  . Naproxen Rash 06/04/2015  . Penicillin g Rash 06/04/2015    No current outpatient prescriptions on file prior to visit.   No current facility-administered medications on file prior to visit.     REVIEW OF SYSTEMS: Cardiovascular: No chest pain, chest pressure, palpitations, orthopnea, or dyspnea on exertion. No claudication or rest pain,  No history of DVT or phlebitis.  Positive leg swelling Pulmonary: No productive cough, asthma or wheezing. Neurologic: No weakness, paresthesias, aphasia, or amaurosis. No dizziness. Hematologic: No bleeding problems or clotting disorders. Musculoskeletal: No joint pain or joint swelling. Gastrointestinal: No blood in stool or hematemesis Genitourinary: No dysuria  or hematuria. Psychiatric:: No history of major depression. Integumentary: Left heel and foot ulcer Constitutional: No fever or chills.  PHYSICAL EXAMINATION:  Filed Vitals:   06/04/15 1137  BP: 138/71  Pulse: 93  Resp: 14  Height: 5\' 6"  (1.676 m)  Weight: 176 lb (79.833 kg)   Body mass index is 28.42 kg/(m^2). General: The patient appears their stated age.   HEENT:  No gross abnormalities Pulmonary: Respirations are non-labored Musculoskeletal: There are no major deformities.   Neurologic: No focal weakness or paresthesias are detected, Skin: 3 x 2 ulcer on the lateral side of the left heel  with red granulation tissue.  There is also a wound on the plantar surface of the heel.  I probed this with a Q-tip and a ghost of bone.  No surrounding erythema. Psychiatric: The patient has normal affect. Cardiovascular: Palpable dorsalis pedis pulse on the left  Diagnostic Studies: Reflux evaluation was performed today.  This was negative for DVT or superficial vein thrombophlebitis.  There is no reflux in the bilateral saphenous veins.  There was no reflux and bilateral small saphenous vein.  There was deep vein reflux   Assessment:  Left foot ulcer Plan: From a vascular perspective, the patient has palpable pedal pulses and therefore I doubt an arterial insufficiency etiology for the nonhealing nature of her wound.  She did have deep vein reflux today on her ultrasound study.  This would explain why she has edema bilaterally.  The treatment of this would be leg elevation and compression.  Because the patient has active open wounds.  She should be considered for a Haematologist.  Her diabetes is likely contributing significantly to her wound healing issues.  She would likely benefit from a repeat evaluation from a orthopedic surgeon given that the heel ulcer goes down to the bone.  She will continue with therapy at the wound center.  She is going to see them on Monday.  She will follow up with me on an as-needed basis     V. Leia Alf, M.D. [facility] Office: [phone] Pager:  [phone]

## 2015-06-07 ENCOUNTER — Encounter (HOSPITAL_BASED_OUTPATIENT_CLINIC_OR_DEPARTMENT_OTHER): Payer: Medicare Other | Attending: Plastic Surgery

## 2015-06-07 DIAGNOSIS — L89152 Pressure ulcer of sacral region, stage 2: Secondary | ICD-10-CM | POA: Insufficient documentation

## 2015-06-07 DIAGNOSIS — M0550 Rheumatoid polyneuropathy with rheumatoid arthritis of unspecified site: Secondary | ICD-10-CM | POA: Insufficient documentation

## 2015-06-07 DIAGNOSIS — E11621 Type 2 diabetes mellitus with foot ulcer: Secondary | ICD-10-CM | POA: Insufficient documentation

## 2015-06-07 DIAGNOSIS — L97421 Non-pressure chronic ulcer of left heel and midfoot limited to breakdown of skin: Secondary | ICD-10-CM | POA: Insufficient documentation

## 2015-06-07 DIAGNOSIS — E11622 Type 2 diabetes mellitus with other skin ulcer: Secondary | ICD-10-CM | POA: Insufficient documentation

## 2015-06-07 DIAGNOSIS — E1121 Type 2 diabetes mellitus with diabetic nephropathy: Secondary | ICD-10-CM | POA: Insufficient documentation

## 2015-06-07 DIAGNOSIS — L97422 Non-pressure chronic ulcer of left heel and midfoot with fat layer exposed: Secondary | ICD-10-CM | POA: Insufficient documentation

## 2015-06-11 ENCOUNTER — Encounter: Payer: Self-pay | Admitting: Emergency Medicine

## 2015-06-11 ENCOUNTER — Other Ambulatory Visit: Payer: Self-pay

## 2015-06-11 ENCOUNTER — Inpatient Hospital Stay
Admission: EM | Admit: 2015-06-11 | Discharge: 2015-06-18 | DRG: 872 | Disposition: A | Discharge status: Skilled Nursing Facility | Payer: Medicare Other | Attending: Internal Medicine | Admitting: Internal Medicine

## 2015-06-11 DIAGNOSIS — N390 Urinary tract infection, site not specified: Secondary | ICD-10-CM | POA: Diagnosis present

## 2015-06-11 DIAGNOSIS — B9620 Unspecified Escherichia coli [E. coli] as the cause of diseases classified elsewhere: Secondary | ICD-10-CM | POA: Diagnosis present

## 2015-06-11 DIAGNOSIS — K589 Irritable bowel syndrome without diarrhea: Secondary | ICD-10-CM | POA: Diagnosis present

## 2015-06-11 DIAGNOSIS — E875 Hyperkalemia: Secondary | ICD-10-CM | POA: Diagnosis present

## 2015-06-11 DIAGNOSIS — L03317 Cellulitis of buttock: Secondary | ICD-10-CM | POA: Diagnosis not present

## 2015-06-11 DIAGNOSIS — Z880 Allergy status to penicillin: Secondary | ICD-10-CM

## 2015-06-11 DIAGNOSIS — E785 Hyperlipidemia, unspecified: Secondary | ICD-10-CM | POA: Diagnosis present

## 2015-06-11 DIAGNOSIS — M1990 Unspecified osteoarthritis, unspecified site: Secondary | ICD-10-CM | POA: Diagnosis present

## 2015-06-11 DIAGNOSIS — L0390 Cellulitis, unspecified: Secondary | ICD-10-CM

## 2015-06-11 DIAGNOSIS — Z87891 Personal history of nicotine dependence: Secondary | ICD-10-CM

## 2015-06-11 DIAGNOSIS — E860 Dehydration: Secondary | ICD-10-CM | POA: Diagnosis present

## 2015-06-11 DIAGNOSIS — I959 Hypotension, unspecified: Secondary | ICD-10-CM | POA: Diagnosis present

## 2015-06-11 DIAGNOSIS — E11649 Type 2 diabetes mellitus with hypoglycemia without coma: Secondary | ICD-10-CM | POA: Diagnosis present

## 2015-06-11 DIAGNOSIS — D649 Anemia, unspecified: Secondary | ICD-10-CM

## 2015-06-11 DIAGNOSIS — Z885 Allergy status to narcotic agent status: Secondary | ICD-10-CM

## 2015-06-11 DIAGNOSIS — K219 Gastro-esophageal reflux disease without esophagitis: Secondary | ICD-10-CM | POA: Diagnosis present

## 2015-06-11 DIAGNOSIS — Z79899 Other long term (current) drug therapy: Secondary | ICD-10-CM

## 2015-06-11 DIAGNOSIS — N179 Acute kidney failure, unspecified: Secondary | ICD-10-CM

## 2015-06-11 DIAGNOSIS — Z823 Family history of stroke: Secondary | ICD-10-CM

## 2015-06-11 DIAGNOSIS — L89312 Pressure ulcer of right buttock, stage 2: Secondary | ICD-10-CM | POA: Diagnosis present

## 2015-06-11 DIAGNOSIS — Z95828 Presence of other vascular implants and grafts: Secondary | ICD-10-CM

## 2015-06-11 DIAGNOSIS — L89320 Pressure ulcer of left buttock, unstageable: Secondary | ICD-10-CM | POA: Diagnosis present

## 2015-06-11 DIAGNOSIS — A419 Sepsis, unspecified organism: Secondary | ICD-10-CM | POA: Diagnosis not present

## 2015-06-11 DIAGNOSIS — L0291 Cutaneous abscess, unspecified: Secondary | ICD-10-CM | POA: Diagnosis present

## 2015-06-11 DIAGNOSIS — I10 Essential (primary) hypertension: Secondary | ICD-10-CM | POA: Diagnosis present

## 2015-06-11 DIAGNOSIS — Z883 Allergy status to other anti-infective agents status: Secondary | ICD-10-CM

## 2015-06-11 DIAGNOSIS — L03116 Cellulitis of left lower limb: Secondary | ICD-10-CM

## 2015-06-11 DIAGNOSIS — E11622 Type 2 diabetes mellitus with other skin ulcer: Secondary | ICD-10-CM | POA: Diagnosis present

## 2015-06-11 DIAGNOSIS — E119 Type 2 diabetes mellitus without complications: Secondary | ICD-10-CM

## 2015-06-11 DIAGNOSIS — Z888 Allergy status to other drugs, medicaments and biological substances status: Secondary | ICD-10-CM

## 2015-06-11 DIAGNOSIS — Z794 Long term (current) use of insulin: Secondary | ICD-10-CM

## 2015-06-11 DIAGNOSIS — L89609 Pressure ulcer of unspecified heel, unspecified stage: Secondary | ICD-10-CM | POA: Diagnosis present

## 2015-06-11 DIAGNOSIS — M069 Rheumatoid arthritis, unspecified: Secondary | ICD-10-CM | POA: Diagnosis present

## 2015-06-11 DIAGNOSIS — I2510 Atherosclerotic heart disease of native coronary artery without angina pectoris: Secondary | ICD-10-CM | POA: Diagnosis present

## 2015-06-11 DIAGNOSIS — L89159 Pressure ulcer of sacral region, unspecified stage: Secondary | ICD-10-CM | POA: Diagnosis present

## 2015-06-11 DIAGNOSIS — Z951 Presence of aortocoronary bypass graft: Secondary | ICD-10-CM

## 2015-06-11 HISTORY — DX: Atherosclerotic heart disease of native coronary artery without angina pectoris: I25.10

## 2015-06-11 LAB — URINALYSIS COMPLETE WITH MICROSCOPIC (ARMC ONLY)
Glucose, UA: NEGATIVE mg/dL
Ketones, ur: NEGATIVE mg/dL
Nitrite: NEGATIVE
Protein, ur: 30 mg/dL — AB
SQUAMOUS EPITHELIAL / LPF: NONE SEEN
Specific Gravity, Urine: 1.016 (ref 1.005–1.030)
pH: 5 (ref 5.0–8.0)

## 2015-06-11 LAB — COMPREHENSIVE METABOLIC PANEL
ALT: 22 U/L (ref 14–54)
ANION GAP: 16 — AB (ref 5–15)
AST: 20 U/L (ref 15–41)
Albumin: 2 g/dL — ABNORMAL LOW (ref 3.5–5.0)
Alkaline Phosphatase: 121 U/L (ref 38–126)
BUN: 74 mg/dL — ABNORMAL HIGH (ref 6–20)
CO2: 16 mmol/L — ABNORMAL LOW (ref 22–32)
Calcium: 8.2 mg/dL — ABNORMAL LOW (ref 8.9–10.3)
Chloride: 98 mmol/L — ABNORMAL LOW (ref 101–111)
Creatinine, Ser: 2.65 mg/dL — ABNORMAL HIGH (ref 0.44–1.00)
GFR calc Af Amer: 20 mL/min — ABNORMAL LOW (ref >60)
GFR calc non Af Amer: 17 mL/min — ABNORMAL LOW (ref >60)
Glucose, Bld: 258 mg/dL — ABNORMAL HIGH (ref 65–99)
POTASSIUM: 5.6 mmol/L — AB (ref 3.5–5.1)
Sodium: 130 mmol/L — ABNORMAL LOW (ref 135–145)
TOTAL PROTEIN: 6.4 g/dL — AB (ref 6.5–8.1)
Total Bilirubin: 0.6 mg/dL (ref 0.3–1.2)

## 2015-06-11 LAB — CBC
HCT: 24.5 % — ABNORMAL LOW (ref 35.0–47.0)
Hemoglobin: 7.9 g/dL — ABNORMAL LOW (ref 12.0–16.0)
MCH: 26.9 pg (ref 26.0–34.0)
MCHC: 32.3 g/dL (ref 32.0–36.0)
MCV: 83.3 fL (ref 80.0–100.0)
Platelets: 173 10*3/uL (ref 150–440)
RBC: 2.94 MIL/uL — ABNORMAL LOW (ref 3.80–5.20)
RDW: 17.2 % — ABNORMAL HIGH (ref 11.5–14.5)
WBC: 13.1 10*3/uL — ABNORMAL HIGH (ref 3.6–11.0)

## 2015-06-11 MED ORDER — SODIUM CHLORIDE 0.9 % IV BOLUS (SEPSIS): 500.0000 mL | INTRAVENOUS | Status: DC

## 2015-06-11 MED ORDER — DEXTROSE 5 % IV SOLN
2.0000 g | Freq: Once | INTRAVENOUS | Status: AC
  Administered 2015-06-11: 2 g via INTRAVENOUS
  Filled 2015-06-11: qty 2

## 2015-06-11 MED ORDER — SODIUM CHLORIDE 0.9 % IV BOLUS (SEPSIS)
1000.0000 mL | INTRAVENOUS | Status: DC
  Administered 2015-06-12: 1000 mL via INTRAVENOUS
  Filled 2015-06-11: qty 1000

## 2015-06-11 MED ORDER — METRONIDAZOLE IN NACL 5-0.79 MG/ML-% IV SOLN
500.0000 mg | Freq: Once | INTRAVENOUS | Status: AC
  Administered 2015-06-12: 500 mg via INTRAVENOUS
  Filled 2015-06-11: qty 100

## 2015-06-11 MED ORDER — SODIUM CHLORIDE 0.9 % IV SOLN: 10.0000 mL/h | Freq: Once | INTRAVENOUS | Status: DC

## 2015-06-11 MED ORDER — VANCOMYCIN HCL IN DEXTROSE 1-5 GM/200ML-% IV SOLN
1000.0000 mg | Freq: Once | INTRAVENOUS | Status: AC
  Administered 2015-06-12: 1000 mg via INTRAVENOUS
  Filled 2015-06-11: qty 200

## 2015-06-11 MED ORDER — SODIUM CHLORIDE 0.9 % IV BOLUS (SEPSIS)
1000.0000 mL | Freq: Once | INTRAVENOUS | Status: AC
  Administered 2015-06-11: 1000 mL via INTRAVENOUS

## 2015-06-11 NOTE — ED Notes (Signed)
Pt states she is unable to give a urine sample at this time.

## 2015-06-11 NOTE — ED Provider Notes (Signed)
[facility] Emergency Department Provider Note  ____________________________________________  Time seen: Approximately 3016 PM  I have reviewed the triage vital signs and the nursing notes.   HISTORY  Chief Complaint Urinary Frequency    HPI Suzanne Ewing is a 69 y.o. female history of diabetes and multiple diabetic wounds presents today with increased abdominal pain and decreased urination after starting UTI treatment 2 days ago. She's been taking Bactrim as an outpatient. She says that she has had increased suprapubic and left lower abdominal pain despite antibiotic treatment. She says this is the pain that she feels when she has a UTI.She says that she has had several episodes of nausea and vomiting. No nausea at this time. Also concerned about several nonhealing wounds including a sacral wound as well as her left foot. Also reporting generalized weakness over the past week.   Past Medical History  Diagnosis Date  . Arthritis   . Anemia   . GERD (gastroesophageal reflux disease)   . Heart disease   . Hypertension   . Nephrolithiasis   . Heel ulcer   . Diabetes mellitus without complication   . Hyperlipidemia     There are no active problems to display for this patient.   Past Surgical History  Procedure Laterality Date  . Coronary artery bypass graft    . Back surgery    . Cholecystectomy    . Shoulder surgery    . Knee surgery Left   . Carpal tunnel release Bilateral     Current Outpatient Rx  Name  Route  Sig  Dispense  Refill  . acetaminophen (TYLENOL) 500 MG tablet   Oral   Take 1,000 mg by mouth every 8 (eight) hours as needed.         Marland Kitchen ascorbic acid (VITAMIN C) 1000 MG tablet   Oral   Take 1,000 mg by mouth daily.         . Calcium-Vitamin D (CALTRATE 600 PLUS-VIT D PO)   Oral   Take by mouth.         . Cranberry 500 MG CAPS   Oral   Take 500 mg by mouth daily.         . cyanocobalamin (,VITAMIN B-12,) 1000  MCG/ML injection   Intramuscular   Inject 1,000 mcg into the muscle every 30 (thirty) days.         Marland Kitchen diltiazem (CARDIZEM) 120 MG tablet   Oral   Take 120 mg by mouth daily.         . diphenhydrAMINE (SOMINEX) 25 MG tablet   Oral   Take 25 mg by mouth at bedtime as needed for sleep.         Marland Kitchen etodolac (LODINE) 500 MG tablet   Oral   Take 500 mg by mouth 2 (two) times daily.         . ferrous sulfate 325 (65 FE) MG tablet   Oral   Take 325 mg by mouth daily with breakfast.         . folic acid (FOLVITE) 1 MG tablet   Oral   Take 1 mg by mouth 2 (two) times daily.         . furosemide (LASIX) 40 MG tablet   Oral   Take 40 mg by mouth.         Marland Kitchen glimepiride (AMARYL) 4 MG tablet   Oral   Take 4 mg by mouth daily with breakfast. Take 1.5 tablets (  6 mg total) by mouth with breakfast  As listed on medication list from [facility]         . glucose blood test strip   Other   1 each by Other route 3 (three) times daily. Use as instructed         . HUMALOG 100 UNIT/ML injection      Inject 4 units at breakfast, 4 units at lunch and 12 units at supper           Dispense as written.   Marland Kitchen HUMULIN N 100 UNIT/ML injection   Subcutaneous   Inject 26 Units into the skin daily.           Dispense as written.   . leflunomide (ARAVA) 20 MG tablet   Oral   Take 20 mg by mouth daily.         Marland Kitchen levocetirizine (XYZAL) 5 MG tablet   Oral   Take 5 mg by mouth every evening.         . lidocaine (LIDODERM) 5 %   Transdermal   Place 1 patch onto the skin.         Marland Kitchen lisinopril (PRINIVIL,ZESTRIL) 20 MG tablet   Oral   Take 20 mg by mouth daily.         Marland Kitchen loperamide (IMODIUM) 2 MG capsule   Oral   Take 4 mg by mouth as needed for diarrhea or loose stools.         Marland Kitchen LORazepam (ATIVAN) 0.5 MG tablet   Oral   Take 0.5 mg by mouth every 8 (eight) hours. Take 1/2 to 1(one) tablet by mouth once daily as needed for anxiety per medication list          . Multiple Vitamins-Minerals (CENTRUM SILVER PO)   Oral   Take by mouth.         . Omega-3 Fatty Acids (FISH OIL) 1000 MG CAPS   Oral   Take by mouth.         Marland Kitchen omeprazole (PRILOSEC) 20 MG capsule   Oral   Take 20 mg by mouth daily.         . potassium chloride (KLOR-CON) 20 MEQ packet   Oral   Take 20 mEq by mouth 2 (two) times daily.         . Potassium Gluconate ([employer] POTASSIUM GLUCONATE) 2 MEQ TABS   Oral   Take 1 tablet by mouth 2 (two) times daily.          . predniSONE (DELTASONE) 1 MG tablet               . predniSONE (DELTASONE) 1 MG tablet   Oral   Take 1 mg by mouth daily with breakfast.          . risedronate (ACTONEL) 35 MG tablet   Oral   Take 35 mg by mouth every 7 (seven) days.          . traMADol (ULTRAM) 50 MG tablet   Oral   Take 50 mg by mouth every 6 (six) hours as needed.          . vitamin A 10000 UNIT capsule   Oral   Take 10,000 Units by mouth daily.            Allergies Macrobid; Penicillins; Tape; Clarithromycin; Nitroglycerin; Leflunomide; Morphine; Naproxen; and Penicillin g  Family History  Problem Relation Age of Onset  . Stroke Mother  Social History History  Substance Use Topics  . Smoking status: Former Smoker    Quit date: 06/03/2014  . Smokeless tobacco: Never Used  . Alcohol Use: 0.0 oz/week    0 Standard drinks or equivalent per week    Review of Systems Constitutional: No fever/chills Eyes: No visual changes. ENT: No sore throat. Cardiovascular: Denies chest pain. Respiratory: Denies shortness of breath. Gastrointestinal:  No diarrhea.  No constipation. Genitourinary: Has not urinated since this morning.  Musculoskeletal: Negative for back pain. Skin: Negative for rash. Neurological: Negative for headaches, focal weakness or numbness.  10-point ROS otherwise negative.  ____________________________________________   PHYSICAL EXAM:  VITAL SIGNS: ED Triage Vitals  Enc  Vitals Group     BP 06/11/15 2001 106/49 mmHg     Pulse Rate 06/11/15 2001 95     Resp 06/11/15 2001 20     Temp 06/11/15 2001 97.9 F (36.6 C)     Temp Source 06/11/15 2001 Oral     SpO2 06/11/15 2001 98 %     Weight 06/11/15 2001 180 lb (81.647 kg)     Height 06/11/15 2001 5\' 7"  (1.702 m)     Head Cir --      Peak Flow --      Pain Score 06/11/15 2002 8     Pain Loc --      Pain Edu? --      Excl. in [place]? --     Constitutional: Alert and oriented. Well appearing and in no acute distress. Eyes: Conjunctivae are normal. PERRL. EOMI. Head: Atraumatic. Nose: No congestion/rhinnorhea. Mouth/Throat: Mucous membranes are moist.  Oropharynx non-erythematous. Neck: No stridor.   Cardiovascular: Normal rate, regular rhythm. Grossly normal heart sounds.  Good peripheral circulation. Respiratory: Normal respiratory effort.  No retractions. Lungs CTAB. Gastrointestinal: Soft abdomen which is tender to the suprapubic as well as left lower quadrant. No distention. No abdominal bruits. No CVA tenderness. Heme-negative rectal exam. Musculoskeletal: Bilateral lower extremity edema.  No joint effusions. Neurologic:  Normal speech and language. No gross focal neurologic deficits are appreciated. No gait instability. Skin:  Sacral erythema with left-sided skin breaks of 1-2 cm 2. Pus able to be expressed from the most lateral of the skin breaks. No tenderness to palpation. Left foot with lateral as well as he'll ulcerations. No pus able to be expressed. No tenderness or erythema surrounding. The wounds are each about 1 cm in length.  Psychiatric: Mood and affect are normal. Speech and behavior are normal.  ____________________________________________   LABS (all labs ordered are listed, but only abnormal results are displayed)  Labs Reviewed  COMPREHENSIVE METABOLIC PANEL - Abnormal; Notable for the following:    Sodium 130 (*)    Potassium 5.6 (*)    Chloride 98 (*)    CO2 16 (*)    Glucose,  Bld 258 (*)    BUN 74 (*)    Creatinine, Ser 2.65 (*)    Calcium 8.2 (*)    Total Protein 6.4 (*)    Albumin 2.0 (*)    GFR calc non Af Amer 17 (*)    GFR calc Af Amer 20 (*)    Anion gap 16 (*)    All other components within normal limits  CBC - Abnormal; Notable for the following:    WBC 13.1 (*)    RBC 2.94 (*)    Hemoglobin 7.9 (*)    HCT 24.5 (*)    RDW 17.2 (*)    All other  components within normal limits  URINALYSIS COMPLETEWITH MICROSCOPIC ([facility] ONLY) - Abnormal; Notable for the following:    Color, Urine YELLOW (*)    APPearance CLOUDY (*)    Bilirubin Urine 1+ (*)    Hgb urine dipstick 3+ (*)    Protein, ur 30 (*)    Leukocytes, UA 2+ (*)    Bacteria, UA MANY (*)    All other components within normal limits  URINE CULTURE  CULTURE, BLOOD (ROUTINE X 2)  CULTURE, BLOOD (ROUTINE X 2)  LACTIC ACID, PLASMA  LACTIC ACID, PLASMA  PREPARE RBC (CROSSMATCH)   ____________________________________________  EKG  ED ECG REPORT I, Doran Stabler, the attending physician, personally viewed and interpreted this ECG.   Date: 06/11/2015  EKG Time: 2257  Rate: 91  Rhythm: normal sinus rhythm  Axis: Left axis  Intervals:right bundle branch block  ST&T Change: T-wave inversion in V2 and V3. No ST elevations or depressions.  ____________________________________________  RADIOLOGY  ____________________________________________   PROCEDURES   ____________________________________________   INITIAL IMPRESSION / ASSESSMENT AND PLAN / ED COURSE  Pertinent labs & imaging results that were available during my care of the patient were reviewed by me and considered in my medical decision making (see chart for details).  ----------------------------------------- 11:52 PM on 06/11/2015 -----------------------------------------  Patient with multiple medical issues including persistent urinary tract infection as well as multiple nonhealing diabetic ulcers. Initiated  the sepsis protocol. We'll admit to the hospital. Signed out to Dr. Reece Levy. Will also transfuse 1 unit because a hemoglobin in the 7.9. ____________________________________________   FINAL CLINICAL IMPRESSION(S) / ED DIAGNOSES  Acute renal failure. Acute sepsis secondary to UTI and cellulitis. Acute anemia. Initial visit.    Orbie Pyo, MD 06/11/15 [id number]

## 2015-06-11 NOTE — ED Notes (Signed)
Patient is currently taking septra for UTI. Awaiting urine culture. Has been on septra since Wednesday. Patient has been feeling poorly since Tuesday. Currently weak. Staying hydrated but unable to eat due to nausea.

## 2015-06-11 NOTE — ED Notes (Signed)
Foley catheter placed. Patient tolerated well. Urine collected for testing. Dark yellow in color and cloudy.

## 2015-06-11 NOTE — ED Notes (Signed)
Pt presents to ER alert and in NAD. Pt reports she is being treated for UTI x 2 days with antibiotics and doesn't feel any better.

## 2015-06-12 ENCOUNTER — Encounter: Payer: Self-pay | Admitting: Internal Medicine

## 2015-06-12 ENCOUNTER — Inpatient Hospital Stay: Payer: Medicare Other

## 2015-06-12 DIAGNOSIS — L89159 Pressure ulcer of sacral region, unspecified stage: Secondary | ICD-10-CM | POA: Diagnosis present

## 2015-06-12 DIAGNOSIS — D649 Anemia, unspecified: Secondary | ICD-10-CM | POA: Diagnosis present

## 2015-06-12 DIAGNOSIS — L89320 Pressure ulcer of left buttock, unstageable: Secondary | ICD-10-CM | POA: Diagnosis present

## 2015-06-12 DIAGNOSIS — L03317 Cellulitis of buttock: Secondary | ICD-10-CM | POA: Diagnosis present

## 2015-06-12 DIAGNOSIS — N390 Urinary tract infection, site not specified: Secondary | ICD-10-CM

## 2015-06-12 DIAGNOSIS — M1990 Unspecified osteoarthritis, unspecified site: Secondary | ICD-10-CM | POA: Diagnosis present

## 2015-06-12 DIAGNOSIS — Z951 Presence of aortocoronary bypass graft: Secondary | ICD-10-CM | POA: Diagnosis not present

## 2015-06-12 DIAGNOSIS — I2510 Atherosclerotic heart disease of native coronary artery without angina pectoris: Secondary | ICD-10-CM | POA: Diagnosis present

## 2015-06-12 DIAGNOSIS — E875 Hyperkalemia: Secondary | ICD-10-CM | POA: Diagnosis present

## 2015-06-12 DIAGNOSIS — I959 Hypotension, unspecified: Secondary | ICD-10-CM | POA: Diagnosis present

## 2015-06-12 DIAGNOSIS — E860 Dehydration: Secondary | ICD-10-CM | POA: Diagnosis present

## 2015-06-12 DIAGNOSIS — Z823 Family history of stroke: Secondary | ICD-10-CM | POA: Diagnosis not present

## 2015-06-12 DIAGNOSIS — L89312 Pressure ulcer of right buttock, stage 2: Secondary | ICD-10-CM | POA: Diagnosis present

## 2015-06-12 DIAGNOSIS — E11622 Type 2 diabetes mellitus with other skin ulcer: Secondary | ICD-10-CM | POA: Diagnosis present

## 2015-06-12 DIAGNOSIS — N179 Acute kidney failure, unspecified: Secondary | ICD-10-CM | POA: Diagnosis present

## 2015-06-12 DIAGNOSIS — L89609 Pressure ulcer of unspecified heel, unspecified stage: Secondary | ICD-10-CM | POA: Diagnosis present

## 2015-06-12 DIAGNOSIS — Z885 Allergy status to narcotic agent status: Secondary | ICD-10-CM | POA: Diagnosis not present

## 2015-06-12 DIAGNOSIS — Z87891 Personal history of nicotine dependence: Secondary | ICD-10-CM | POA: Diagnosis not present

## 2015-06-12 DIAGNOSIS — E119 Type 2 diabetes mellitus without complications: Secondary | ICD-10-CM

## 2015-06-12 DIAGNOSIS — E785 Hyperlipidemia, unspecified: Secondary | ICD-10-CM | POA: Diagnosis present

## 2015-06-12 DIAGNOSIS — A419 Sepsis, unspecified organism: Secondary | ICD-10-CM | POA: Diagnosis present

## 2015-06-12 DIAGNOSIS — Z79899 Other long term (current) drug therapy: Secondary | ICD-10-CM | POA: Diagnosis not present

## 2015-06-12 DIAGNOSIS — I10 Essential (primary) hypertension: Secondary | ICD-10-CM | POA: Diagnosis present

## 2015-06-12 DIAGNOSIS — L0390 Cellulitis, unspecified: Secondary | ICD-10-CM

## 2015-06-12 DIAGNOSIS — Z883 Allergy status to other anti-infective agents status: Secondary | ICD-10-CM | POA: Diagnosis not present

## 2015-06-12 DIAGNOSIS — Z888 Allergy status to other drugs, medicaments and biological substances status: Secondary | ICD-10-CM | POA: Diagnosis not present

## 2015-06-12 DIAGNOSIS — Z880 Allergy status to penicillin: Secondary | ICD-10-CM | POA: Diagnosis not present

## 2015-06-12 DIAGNOSIS — K219 Gastro-esophageal reflux disease without esophagitis: Secondary | ICD-10-CM | POA: Diagnosis present

## 2015-06-12 DIAGNOSIS — E11649 Type 2 diabetes mellitus with hypoglycemia without coma: Secondary | ICD-10-CM | POA: Diagnosis present

## 2015-06-12 DIAGNOSIS — Z794 Long term (current) use of insulin: Secondary | ICD-10-CM | POA: Diagnosis not present

## 2015-06-12 DIAGNOSIS — L0291 Cutaneous abscess, unspecified: Secondary | ICD-10-CM | POA: Diagnosis present

## 2015-06-12 DIAGNOSIS — M069 Rheumatoid arthritis, unspecified: Secondary | ICD-10-CM | POA: Diagnosis present

## 2015-06-12 DIAGNOSIS — B9620 Unspecified Escherichia coli [E. coli] as the cause of diseases classified elsewhere: Secondary | ICD-10-CM | POA: Diagnosis present

## 2015-06-12 DIAGNOSIS — K589 Irritable bowel syndrome without diarrhea: Secondary | ICD-10-CM | POA: Diagnosis present

## 2015-06-12 LAB — CREATININE, SERUM
Creatinine, Ser: 2.33 mg/dL — ABNORMAL HIGH (ref 0.44–1.00)
GFR calc non Af Amer: 20 mL/min — ABNORMAL LOW (ref >60)
GFR, EST AFRICAN AMERICAN: 23 mL/min — AB (ref >60)

## 2015-06-12 LAB — ABO/RH: ABO/RH(D): O POS

## 2015-06-12 LAB — HEMOGLOBIN AND HEMATOCRIT, BLOOD
HCT: 27.9 % — ABNORMAL LOW (ref 35.0–47.0)
Hemoglobin: 9 g/dL — ABNORMAL LOW (ref 12.0–16.0)

## 2015-06-12 LAB — GLUCOSE, CAPILLARY
GLUCOSE-CAPILLARY: 63 mg/dL — AB (ref 65–99)
GLUCOSE-CAPILLARY: 95 mg/dL (ref 65–99)
Glucose-Capillary: 97 mg/dL (ref 65–99)
Glucose-Capillary: 151 mg/dL — ABNORMAL HIGH (ref 65–99)
Glucose-Capillary: 157 mg/dL — ABNORMAL HIGH (ref 65–99)

## 2015-06-12 LAB — PREPARE RBC (CROSSMATCH)

## 2015-06-12 LAB — LACTIC ACID, PLASMA: Lactic Acid, Venous: 2 mmol/L (ref 0.5–2.0)

## 2015-06-12 MED ORDER — FOLIC ACID 1 MG PO TABS
1.0000 mg | ORAL_TABLET | Freq: Two times a day (BID) | ORAL | Status: DC
  Administered 2015-06-12 – 2015-06-18 (×12): 1 mg via ORAL
  Filled 2015-06-12 (×13): qty 1

## 2015-06-12 MED ORDER — ASPIRIN EC 81 MG PO TBEC
81.0000 mg | DELAYED_RELEASE_TABLET | Freq: Every day | ORAL | Status: DC
  Administered 2015-06-12 – 2015-06-18 (×7): 81 mg via ORAL
  Filled 2015-06-12 (×7): qty 1

## 2015-06-12 MED ORDER — ACETAMINOPHEN 650 MG RE SUPP
650.0000 mg | Freq: Four times a day (QID) | RECTAL | Status: DC | PRN
Start: 2015-06-12 — End: 2015-06-18

## 2015-06-12 MED ORDER — VANCOMYCIN HCL IN DEXTROSE 1-5 GM/200ML-% IV SOLN
1000.0000 mg | INTRAVENOUS | Status: DC
  Administered 2015-06-12: 1000 mg via INTRAVENOUS
  Filled 2015-06-12 (×2): qty 200

## 2015-06-12 MED ORDER — HEPARIN SODIUM (PORCINE) 5000 UNIT/ML IJ SOLN
5000.0000 [IU] | Freq: Two times a day (BID) | INTRAMUSCULAR | Status: DC
  Administered 2015-06-12 – 2015-06-18 (×12): 5000 [IU] via SUBCUTANEOUS
  Filled 2015-06-12 (×12): qty 1

## 2015-06-12 MED ORDER — PREDNISONE 1 MG PO TABS
1.0000 mg | ORAL_TABLET | Freq: Every day | ORAL | Status: DC
  Filled 2015-06-12 (×3): qty 1

## 2015-06-12 MED ORDER — PANTOPRAZOLE SODIUM 40 MG PO TBEC
40.0000 mg | DELAYED_RELEASE_TABLET | Freq: Every day | ORAL | Status: DC
  Administered 2015-06-12 – 2015-06-17 (×6): 40 mg via ORAL
  Filled 2015-06-12 (×6): qty 1

## 2015-06-12 MED ORDER — HEPARIN SODIUM (PORCINE) 5000 UNIT/ML IJ SOLN
5000.0000 [IU] | Freq: Three times a day (TID) | INTRAMUSCULAR | Status: DC
  Administered 2015-06-12: 5000 [IU] via SUBCUTANEOUS
  Filled 2015-06-12: qty 1

## 2015-06-12 MED ORDER — ONDANSETRON HCL 4 MG/2ML IJ SOLN: 4.0000 mg | Freq: Four times a day (QID) | INTRAMUSCULAR | Status: DC | PRN

## 2015-06-12 MED ORDER — FERROUS SULFATE 325 (65 FE) MG PO TABS
325.0000 mg | ORAL_TABLET | Freq: Every day | ORAL | Status: DC
  Administered 2015-06-12: 325 mg via ORAL
  Filled 2015-06-12: qty 1

## 2015-06-12 MED ORDER — SODIUM CHLORIDE 0.9 % IV SOLN
INTRAVENOUS | Status: DC
  Administered 2015-06-12: 05:00:00 via INTRAVENOUS

## 2015-06-12 MED ORDER — LEFLUNOMIDE 20 MG PO TABS
20.0000 mg | ORAL_TABLET | Freq: Every day | ORAL | Status: DC
  Filled 2015-06-12: qty 1

## 2015-06-12 MED ORDER — SODIUM CHLORIDE 0.9 % IJ SOLN
3.0000 mL | Freq: Two times a day (BID) | INTRAMUSCULAR | Status: DC
  Administered 2015-06-12 – 2015-06-14 (×5): 3 mL via INTRAVENOUS

## 2015-06-12 MED ORDER — PREDNISONE 20 MG PO TABS
20.0000 mg | ORAL_TABLET | Freq: Every day | ORAL | Status: DC
  Administered 2015-06-13: 20 mg via ORAL
  Filled 2015-06-12: qty 1

## 2015-06-12 MED ORDER — SODIUM CHLORIDE 0.9 % IV SOLN
INTRAVENOUS | Status: DC
  Administered 2015-06-12 – 2015-06-18 (×9): via INTRAVENOUS

## 2015-06-12 MED ORDER — ONDANSETRON HCL 4 MG PO TABS
4.0000 mg | ORAL_TABLET | Freq: Four times a day (QID) | ORAL | Status: DC | PRN
  Filled 2015-06-12: qty 1

## 2015-06-12 MED ORDER — ACETAMINOPHEN 325 MG PO TABS
650.0000 mg | ORAL_TABLET | Freq: Four times a day (QID) | ORAL | Status: DC | PRN
  Administered 2015-06-12 – 2015-06-13 (×2): 650 mg via ORAL
  Filled 2015-06-12 (×2): qty 2

## 2015-06-12 MED ORDER — DEXTROSE 5 % IV SOLN
500.0000 mg | Freq: Three times a day (TID) | INTRAVENOUS | Status: DC
  Administered 2015-06-12 – 2015-06-13 (×4): 500 mg via INTRAVENOUS
  Filled 2015-06-12 (×10): qty 0.5

## 2015-06-12 MED ORDER — METRONIDAZOLE IN NACL 5-0.79 MG/ML-% IV SOLN
500.0000 mg | Freq: Three times a day (TID) | INTRAVENOUS | Status: DC
  Administered 2015-06-12 – 2015-06-15 (×10): 500 mg via INTRAVENOUS
  Filled 2015-06-12 (×13): qty 100

## 2015-06-12 MED ORDER — INSULIN ASPART 100 UNIT/ML ~~LOC~~ SOLN
0.0000 [IU] | Freq: Three times a day (TID) | SUBCUTANEOUS | Status: DC
  Administered 2015-06-12: 2 [IU] via SUBCUTANEOUS
  Administered 2015-06-13: 9 [IU] via SUBCUTANEOUS
  Administered 2015-06-14: 2 [IU] via SUBCUTANEOUS
  Administered 2015-06-14: 1 [IU] via SUBCUTANEOUS
  Administered 2015-06-14: 3 [IU] via SUBCUTANEOUS
  Administered 2015-06-16: 2 [IU] via SUBCUTANEOUS
  Administered 2015-06-16: 3 [IU] via SUBCUTANEOUS
  Filled 2015-06-12: qty 3
  Filled 2015-06-12: qty 1
  Filled 2015-06-12 (×3): qty 2
  Filled 2015-06-12: qty 9
  Filled 2015-06-12: qty 3

## 2015-06-12 MED ORDER — SODIUM POLYSTYRENE SULFONATE 15 GM/60ML PO SUSP
30.0000 g | Freq: Once | ORAL | Status: AC
  Administered 2015-06-12: 30 g via ORAL
  Filled 2015-06-12: qty 120

## 2015-06-12 MED ORDER — LORAZEPAM 0.5 MG PO TABS
0.5000 mg | ORAL_TABLET | Freq: Three times a day (TID) | ORAL | Status: DC
  Administered 2015-06-12 – 2015-06-18 (×16): 0.5 mg via ORAL
  Filled 2015-06-12 (×19): qty 1

## 2015-06-12 NOTE — H&P (Signed)
Suzanne Ewing NAME: Roben Tatsch    MR#:  [id number]  DATE OF BIRTH:  26-Aug-1946  DATE OF ADMISSION:  06/11/2015  PRIMARY CARE PHYSICIAN: Glendon Axe, MD   REQUESTING/REFERRING PHYSICIAN: Dr. Zella Ball.  CHIEF COMPLAINT:   Chief Complaint  Patient presents with  . Urinary Frequency    Pt presents to ER alert and in NAD. Pt reports she is being treated for UTI x 2 days with antibiotics and doesn't feel any better.   lower abdominal pain Unable to pass urine  HISTORY OF PRESENT ILLNESS:  Suzanne Ewing  is a 69 y.o. female with a known history of multiple below mentioned problems, recently diagnosed with UTI and started on  Bactrim by her PCP 2 days ago presents to the emergency room with the complaints of lower abdominal pain, inability to pass urine and not feeling well. Patient also complains of ongoing nausea and vomiting for the past 2-3 days and has been having decreased oral intake. Admits having fevers on and off. Denies any chest pain, shortness of breath, dizziness, diarrhea. In the emergency room patient was evaluated by the ED physician and was found to be hypotensive and tachycardic and workup revealed elevated white blood cell count of 13.1, potassium 5.6, BUN/creatinine 74/2.63, H&H 7.9/24.5, urinalysis significant for UTI. Patient was noted to have multiple nonhealing diabetic ulcers on the left lower extremity which have been chronic. It was also noted to have a sacral deep decubitus ulcer with surrounding erythema with purulent secretion. Sepsis protocol was initiated by the ED physician, was given IV fluids normal saline boluses and after obtaining blood and urine cultures patient was started on IV antibiotics-namely aztreonam, vancomycin and metronidazole. One unit of PRBC transfusion was also ordered by the ED physician which is pending at this time. Foley catheter was placed which is draining cloudy urine. Hospitalist  service was consulted for further management. Patient mentions that she is feeling slightly better, but still has lower abdominal discomfort. Denies any chest pain, shortness of breath, cough.  PAST MEDICAL HISTORY:   Past Medical History  Diagnosis Date  . Arthritis   . Anemia   . GERD (gastroesophageal reflux disease)   . Heart disease   . Hypertension   . Nephrolithiasis   . Heel ulcer   . Diabetes mellitus without complication   . Hyperlipidemia   . Coronary artery disease     PAST SURGICAL HISTORY:   Past Surgical History  Procedure Laterality Date  . Coronary artery bypass graft    . Back surgery    . Cholecystectomy    . Shoulder surgery    . Knee surgery Left   . Carpal tunnel release Bilateral   . Joint replacement      SOCIAL HISTORY:   History  Substance Use Topics  . Smoking status: Former Smoker    Quit date: 06/03/2014  . Smokeless tobacco: Never Used  . Alcohol Use: 0.0 oz/week    0 Standard drinks or equivalent per week    FAMILY HISTORY:   Family History  Problem Relation Age of Onset  . Stroke Mother   . Diabetes type II Sister   . Hypertension Sister     DRUG ALLERGIES:   Allergies  Allergen Reactions  . Macrobid [Nitrofurantoin Monohyd Macro] Itching, Rash and Swelling    PT HOSPITALIZED  . Penicillins Shortness Of Breath and Rash  . Tape Rash  . Clarithromycin Other (See Comments)  Abdominal pain  . Nitroglycerin Other (See Comments)    Reaction - unknown  . Leflunomide Rash  . Morphine Nausea And Vomiting    Nausea and vomiting  . Naproxen Rash  . Penicillin G Rash    REVIEW OF SYSTEMS:   Review of Systems  Constitutional: Positive for fever and malaise/fatigue. Negative for chills.  HENT: Negative for ear pain, hearing loss, nosebleeds, sore throat and tinnitus.   Eyes: Negative for blurred vision, double vision, pain, discharge and redness.  Respiratory: Negative for cough, hemoptysis, sputum production, shortness  of breath and wheezing.   Cardiovascular: Negative for chest pain, palpitations, orthopnea and leg swelling.  Gastrointestinal: Positive for nausea, vomiting and abdominal pain. Negative for diarrhea, constipation, blood in stool and melena.  Genitourinary: Positive for dysuria and frequency. Negative for urgency and hematuria.  Musculoskeletal: Negative for back pain, joint pain and neck pain.  Skin: Negative for itching and rash.       Multiple chronic nonhealing diabetic ulcers on the left lower extremity + Redness with ulcers over the sacral region +  Neurological: Negative for dizziness, tingling, sensory change, focal weakness and seizures.  Endo/Heme/Allergies: Does not bruise/bleed easily.  Psychiatric/Behavioral: Negative for depression. The patient is not nervous/anxious.     MEDICATIONS AT HOME:   Prior to Admission medications   Medication Sig Start Date End Date Taking? Authorizing Provider  acetaminophen (TYLENOL) 500 MG tablet Take 1,000 mg by mouth every 8 (eight) hours as needed.   Yes Historical Provider, MD  ascorbic acid (VITAMIN C) 1000 MG tablet Take 1,000 mg by mouth daily.   Yes Historical Provider, MD  Calcium-Vitamin D (CALTRATE 600 PLUS-VIT D PO) Take by mouth.   Yes Historical Provider, MD  CARTIA XT 120 MG 24 hr capsule Take 1 tablet by mouth daily. 04/23/15  Yes Historical Provider, MD  Cranberry 500 MG CAPS Take 500 mg by mouth daily.   Yes Historical Provider, MD  cyanocobalamin (,VITAMIN B-12,) 1000 MCG/ML injection Inject 1,000 mcg into the muscle every 30 (thirty) days.   Yes Historical Provider, MD  diltiazem (CARDIZEM) 120 MG tablet Take 120 mg by mouth daily.   Yes Historical Provider, MD  diphenhydrAMINE (SOMINEX) 25 MG tablet Take 25 mg by mouth at bedtime as needed for sleep.   Yes Historical Provider, MD  etodolac (LODINE) 500 MG tablet Take 500 mg by mouth 2 (two) times daily.   Yes Historical Provider, MD  ferrous sulfate 325 (65 FE) MG tablet Take  325 mg by mouth daily with breakfast.   Yes Historical Provider, MD  folic acid (FOLVITE) 1 MG tablet Take 1 mg by mouth 2 (two) times daily.   Yes Historical Provider, MD  furosemide (LASIX) 40 MG tablet Take 40 mg by mouth.   Yes Historical Provider, MD  glimepiride (AMARYL) 4 MG tablet Take 4 mg by mouth daily with breakfast. Take 1.5 tablets (6 mg total) by mouth with breakfast  As listed on medication list from [facility]   Yes Historical Provider, MD  glucose blood test strip 1 each by Other route 3 (three) times daily. Use as instructed   Yes Historical Provider, MD  HUMALOG 100 UNIT/ML injection Inject 4 units at breakfast, 4 units at lunch and 12 units at supper 04/29/15  Yes Historical Provider, MD  HUMULIN N 100 UNIT/ML injection Inject 26 Units into the skin daily. 05/13/15  Yes Historical Provider, MD  leflunomide (ARAVA) 20 MG tablet Take 20 mg by mouth daily.  Yes Historical Provider, MD  levocetirizine (XYZAL) 5 MG tablet Take 5 mg by mouth every evening.   Yes Historical Provider, MD  lidocaine (LIDODERM) 5 % Place 1 patch onto the skin. 07/24/14  Yes Historical Provider, MD  lisinopril (PRINIVIL,ZESTRIL) 20 MG tablet Take 20 mg by mouth daily.   Yes Historical Provider, MD  loperamide (IMODIUM) 2 MG capsule Take 4 mg by mouth as needed for diarrhea or loose stools.   Yes Historical Provider, MD  LORazepam (ATIVAN) 0.5 MG tablet Take 0.5 mg by mouth every 8 (eight) hours. Take 1/2 to 1(one) tablet by mouth once daily as needed for anxiety per medication list   Yes Historical Provider, MD  Multiple Vitamins-Minerals (CENTRUM SILVER PO) Take by mouth.   Yes Historical Provider, MD  Omega-3 Fatty Acids (FISH OIL) 1000 MG CAPS Take by mouth.   Yes Historical Provider, MD  omeprazole (PRILOSEC) 20 MG capsule Take 20 mg by mouth daily.   Yes Historical Provider, MD  potassium chloride (KLOR-CON) 20 MEQ packet Take 20 mEq by mouth 2 (two) times daily.   Yes Historical Provider, MD   Potassium Gluconate ([employer] POTASSIUM GLUCONATE) 2 MEQ TABS Take 1 tablet by mouth 2 (two) times daily.    Yes Historical Provider, MD  predniSONE (DELTASONE) 1 MG tablet Take 1 mg by mouth daily with breakfast.  04/08/15  Yes Historical Provider, MD  risedronate (ACTONEL) 35 MG tablet Take 35 mg by mouth every 7 (seven) days.    Yes Historical Provider, MD  SANTYL ointment Apply 1 application topically daily as needed. 04/27/15  Yes Historical Provider, MD  sulfamethoxazole-trimethoprim (BACTRIM DS,SEPTRA DS) 800-160 MG per tablet Take 1 tablet by mouth 2 (two) times daily. 06/10/15  Yes Historical Provider, MD  traMADol (ULTRAM) 50 MG tablet Take 50 mg by mouth every 6 (six) hours as needed.  04/23/15  Yes Historical Provider, MD  predniSONE (DELTASONE) 1 MG tablet  04/04/13   Historical Provider, MD  vitamin A 10000 UNIT capsule Take 10,000 Units by mouth daily.     Historical Provider, MD      VITAL SIGNS:  Blood pressure 83/41, pulse 88, temperature 97.9 F (36.6 C), temperature source Oral, resp. rate 14, height 5\' 7"  (1.702 m), weight 81.647 kg (180 lb), SpO2 100 %.  PHYSICAL EXAMINATION:  Physical Exam  Constitutional: She is oriented to person, place, and time. She appears well-developed. No distress.  Chronically ill-looking +  HENT:  Head: Normocephalic and atraumatic.  Right Ear: External ear normal.  Left Ear: External ear normal.  Nose: Nose normal.  Mouth/Throat: No oropharyngeal exudate.  Dry oral mucosa +  Eyes: EOM are normal. Pupils are equal, round, and reactive to light. No scleral icterus.  Neck: Normal range of motion. Neck supple. No JVD present. No thyromegaly present.  Cardiovascular: Normal rate, regular rhythm, normal heart sounds and intact distal pulses.  Exam reveals no friction rub.   No murmur heard. Respiratory: Effort normal and breath sounds normal. No respiratory distress. She has no wheezes. She has no rales. She exhibits no tenderness.  GI: Soft. Bowel  sounds are normal. She exhibits distension. She exhibits no mass. There is no tenderness. There is no rebound and no guarding.  Mild  suprapubic tenderness +  Musculoskeletal: Normal range of motion. She exhibits no edema.  Lymphadenopathy:    She has no cervical adenopathy.  Neurological: She is alert and oriented to person, place, and time. She has normal reflexes. She displays normal reflexes.  No cranial nerve deficit. She exhibits normal muscle tone.  Skin: Skin is warm. No rash noted. There is erythema.  Multiple chronic nonhealing diabetic ulcers over left lower extremity + Superficial ulcer with purulent exudate from sacral ulcer +  Psychiatric: She has a normal mood and affect. Her behavior is normal. Thought content normal.   LABORATORY PANEL:   CBC  Recent Labs Lab 06/11/15 2004  WBC 13.1*  HGB 7.9*  HCT 24.5*  PLT 173   ------------------------------------------------------------------------------------------------------------------  Chemistries   Recent Labs Lab 06/11/15 2004  NA 130*  K 5.6*  CL 98*  CO2 16*  GLUCOSE 258*  BUN 74*  CREATININE 2.65*  CALCIUM 8.2*  AST 20  ALT 22  ALKPHOS 121  BILITOT 0.6   ------------------------------------------------------------------------------------------------------------------  Cardiac Enzymes No results for input(s): TROPONINI in the last 168 hours. ------------------------------------------------------------------------------------------------------------------  RADIOLOGY:  No results found.  EKG:  No orders found for this or any previous visit. normal sinus rhythm with ventricular rate of 91 bpm, left axis deviation, right bundle branch block. No acute ischemic changes.  IMPRESSION AND PLAN:   69 year old female with medical problems presents with dysuria, inability to pass urine and lower abdominal pain fever, found to have sepsis secondary to UTI and cellulitis last decubitus ulcers-chronic diabetic  ulcers, acute renal failure, hyperkalemia. 1. Sepsis secondary to UTI + cellulitis/chronic nonhealing diabetic ulcers over left lower extremity and sacral decubitus ulcer. Plan: Admit, IV fluids, follow-up urine output and bp measurements, continue IV antibiotics-aztreonam, vancomycin and metronidazole, follow-up CBC, blood and urine cultures. Consult wound care team. 2. Acute renal failure likely ATN secondary to dehydration.  Plan: Careful IV hydration, monitor urine output, follow-up BMP. Order renal ultrasound to rule out obstructive causes. Nephrology consultation requested for further advice. 3. Hyperkalemia secondary to combination of acute renal failure and potassium supplements supplements at home. Plan: Stop her-supplementation, Kayexalate, IV hydration, follow-up BMP. 4. Anemia, acute on chronic. Awaiting PRBC transfusion. Follow-up CBC posttransfusion. 5. Hypertension, patient now hypotensive. We will hold off had tapered his medications for now. Follow BP measurements. 6. Coronary artery disease status post CABG, stable clinically. No acute problems. Monitor. 7. Diabetes mellitus type 2. Stable clinically. We'll hold oral medications and long-acting insulin at this time. Sliding scale insulin, monitor blood sugars closely. 8. Rheumatoid arthritis, on leflunomide and prednisone. Stable continue same. 9. GERD, stable on PPI. Continue same    All the records are reviewed and case discussed with ED provider. Management plans discussed with the patient, family and they are in agreement.  CODE STATUS: Full code  TOTAL TIME TAKING CARE OF THIS PATIENT: 50 minutes.    Azucena Freed N M.D on 06/12/2015 at 1:17 AM  Between 7am to 6pm - Pager - [phone]  After 6pm go to [email or web address] - password [email or web address]  Office  [phone]  CC: Primary care physician; Glendon Axe, MD

## 2015-06-12 NOTE — Progress Notes (Signed)
ANTIBIOTIC CONSULT NOTE - INITIAL  Pharmacy Consult for VANCOMCYIN/AZTREONAM/FLAGYL Indication: Sepsis, UTI, cellulits  Allergies  Allergen Reactions  . Macrobid [Nitrofurantoin Monohyd Macro] Itching, Rash and Swelling    PT HOSPITALIZED  . Penicillins Shortness Of Breath and Rash  . Tape Rash  . Clarithromycin Other (See Comments)    Abdominal pain  . Nitroglycerin Other (See Comments)    Reaction - unknown  . Leflunomide Rash  . Morphine Nausea And Vomiting    Nausea and vomiting  . Naproxen Rash  . Penicillin G Rash    Patient Measurements: Height: 5\' 7"  (170.2 cm) Weight: 190 lb (86.183 kg) IBW/kg (Calculated) : 61.6 Adjusted Body Weight: 71.4  Vital Signs: Temp: 98 F (36.7 C) (07/23 0311) Temp Source: Oral (07/23 0311) BP: 112/41 mmHg (07/23 0311) Pulse Rate: 97 (07/23 0311) Intake/Output from previous day: 07/22 0701 - 07/23 0700 In: -  Out: 600 [Urine:600] Intake/Output from this shift: Total I/O In: -  Out: 600 [Urine:600]  Labs:  Recent Labs  06/11/15 2004  WBC 13.1*  HGB 7.9*  PLT 173  CREATININE 2.65*   Estimated Creatinine Clearance: 22.6 mL/min (by C-G formula based on Cr of 2.65).  Microbiology: No results found for this or any previous visit (from the past 720 hour(s)).  Medical History: Past Medical History  Diagnosis Date  . Arthritis   . Anemia   . GERD (gastroesophageal reflux disease)   . Heart disease   . Hypertension   . Nephrolithiasis   . Heel ulcer   . Diabetes mellitus without complication   . Hyperlipidemia   . Coronary artery disease     Medications:  Scheduled:  . sodium chloride  10 mL/hr Intravenous Once  . aspirin EC  81 mg Oral Daily  . aztreonam  500 mg Intravenous 3 times per day  . ferrous sulfate  325 mg Oral Q breakfast  . folic acid  1 mg Oral BID  . heparin  5,000 Units Subcutaneous 3 times per day  . insulin aspart  0-9 Units Subcutaneous TID WC  . leflunomide  20 mg Oral Daily  . LORazepam   0.5 mg Oral 3 times per day  . metronidazole  500 mg Intravenous Q8H  . pantoprazole  40 mg Oral Daily  . predniSONE  1 mg Oral Q breakfast  . sodium chloride  3 mL Intravenous Q12H  . sodium polystyrene  30 g Oral Once  . vancomycin  1,000 mg Intravenous Q36H   Anti-infectives    Start     Dose/Rate Route Frequency Ordered Stop   06/12/15 1730  vancomycin (VANCOCIN) IVPB 1000 mg/200 mL premix     1,000 mg 200 mL/hr over 60 Minutes Intravenous Every 36 hours 06/12/15 0433     06/12/15 0900  metroNIDAZOLE (FLAGYL) IVPB 500 mg     500 mg 100 mL/hr over 60 Minutes Intravenous Every 8 hours 06/12/15 0345     06/12/15 0800  aztreonam (AZACTAM) 500 mg in dextrose 5 % 50 mL IVPB     500 mg 100 mL/hr over 30 Minutes Intravenous 3 times per day 06/12/15 0357     06/11/15 2330  aztreonam (AZACTAM) 2 g in dextrose 5 % 50 mL IVPB     2 g 100 mL/hr over 30 Minutes Intravenous  Once 06/11/15 2318 06/12/15 0016   06/11/15 2330  metroNIDAZOLE (FLAGYL) IVPB 500 mg     500 mg 100 mL/hr over 60 Minutes Intravenous  Once 06/11/15 2318 06/12/15 0205  06/11/15 2330  vancomycin (VANCOCIN) IVPB 1000 mg/200 mL premix     1,000 mg 200 mL/hr over 60 Minutes Intravenous  Once 06/11/15 2318 06/12/15 2122     Assessment: 69 yo female with Sepsis, UTI, cellulits.  Goal of Therapy:  Vancomycin trough level 15-20 mcg/ml  Plan:  Measure antibiotic drug levels at steady state Follow up culture results follow up for descalation    Will order Metronidazole 500mg  IV q8h Will order Aztreonam 500mg  IV q8h. Patient received Vancomycin 1 gram in ER. Will continue with 1 gram IV q36h with stacked dosing. Will check trough 7/26 at 1700.  Chinita Greenland PharmD Clinical Pharmacist 06/12/2015

## 2015-06-12 NOTE — Progress Notes (Signed)
Patient ID: Suzanne Ewing, female   DOB: [birth year], 69 y.o.   MRN: [id number] SUBJECTIVE:  Admitted this AM with abd pain, acute renal failure, hypotension, sepsis; pt with sacral and heel decubiti, with report that sacral decubitus appeared infected.  On 3 abx, IVF.  Pt with RA history, on leflunomide, pred 10 chronically.   ______________________________________________________________________  ROS: Please see HPI; remainder of complete 10 point ROS is negative   Past Medical History  Diagnosis Date  . Arthritis   . Anemia   . GERD (gastroesophageal reflux disease)   . Heart disease   . Hypertension   . Nephrolithiasis   . Heel ulcer   . Diabetes mellitus without complication   . Hyperlipidemia   . Coronary artery disease     Past Surgical History  Procedure Laterality Date  . Coronary artery bypass graft    . Back surgery    . Cholecystectomy    . Shoulder surgery    . Knee surgery Left   . Carpal tunnel release Bilateral   . Joint replacement       Current facility-administered medications:  .  0.9 %  sodium chloride infusion, , Intravenous, Continuous, Tama High III, MD .  acetaminophen (TYLENOL) tablet 650 mg, 650 mg, Oral, Q6H PRN, 650 mg at 06/12/15 1660 **OR** acetaminophen (TYLENOL) suppository 650 mg, 650 mg, Rectal, Q6H PRN, Juluis Mire, MD .  aspirin EC tablet 81 mg, 81 mg, Oral, Daily, Juluis Mire, MD .  aztreonam (AZACTAM) 500 mg in dextrose 5 % 50 mL IVPB, 500 mg, Intravenous, 3 times per day, Juluis Mire, MD .  folic acid (FOLVITE) tablet 1 mg, 1 mg, Oral, BID, Juluis Mire, MD .  heparin injection 5,000 Units, 5,000 Units, Subcutaneous, BID, Tama High III, MD .  insulin aspart (novoLOG) injection 0-9 Units, 0-9 Units, Subcutaneous, TID WC, Juluis Mire, MD, 0 Units at 06/12/15 0745 .  LORazepam (ATIVAN) tablet 0.5 mg, 0.5 mg, Oral, 3 times per day, Juluis Mire, MD, 0.5 mg at 06/12/15 0612 .  metroNIDAZOLE (FLAGYL) IVPB  500 mg, 500 mg, Intravenous, Q8H, Juluis Mire, MD .  ondansetron (ZOFRAN) tablet 4 mg, 4 mg, Oral, Q6H PRN **OR** ondansetron (ZOFRAN) injection 4 mg, 4 mg, Intravenous, Q6H PRN, Juluis Mire, MD .  pantoprazole (PROTONIX) EC tablet 40 mg, 40 mg, Oral, Daily, Juluis Mire, MD .  Derrill Memo ON 06/13/2015] predniSONE (DELTASONE) tablet 20 mg, 20 mg, Oral, Q breakfast, Tama High III, MD .  sodium chloride 0.9 % injection 3 mL, 3 mL, Intravenous, Q12H, Juluis Mire, MD .  vancomycin (VANCOCIN) IVPB 1000 mg/200 mL premix, 1,000 mg, Intravenous, Q36H, Juluis Mire, MD  PHYSICAL EXAM:  BP 109/42 mmHg  Pulse 102  Temp(Src) 97.7 F (36.5 C) (Oral)  Resp 18  Ht 5\' 7"  (1.702 m)  Wt 86.183 kg (190 lb)  BMI 29.75 kg/m2  SpO2 97%  General: Well developed, well nourished female, in NAD HEENT: PERRL; OP dry without lesions. Neck: supple, trachea midline, no thyromegaly Chest: normal to palpation Lungs: decreased airflow without retractions or wheezes Cardiovascular: RRR; distal pulses 1+ Abdomen: soft, nontender, quiet bowel sounds; foley in place Extremities: no clubbing, cyanosis.  Left heel with dressing in place Neuro: alert, moves all extremities; generalized weakness Derm: as above; dressing over sacral decubitus; decreased skin turgor Lymph: no cervical or supraclavicular lymphadenopathy  Labs and imaging studies were reviewed  ASSESSMENT/PLAN:   1.  Acute renal failure/dehydration/uti- cont IVF support, increasing rate; watch for overload. On broad spectrum abx; follow cultures. Hypotension may be more from dehydration than sepsis, as pt reports poor appetite and occasional loose stool 2. DM- cover with SSI 3. RA- hold leflunomide; will increase prednisone, given chronic use in acutely ill patient.  Pt aware of risk 4. Decubitus ulcers- abx as above; wound clinic to see pt 5. GERD- pantoprazole. 6. Anemia- has been transfused, which will limit further workup;  following cbc 7. CAD- monitor HR/BP holding BP meds for now due to hypotension

## 2015-06-12 NOTE — Progress Notes (Signed)
Spoke with Dr Reece Levy regarding lactic acid level.  MD acknowledged. Lynnda Shields, RN

## 2015-06-13 LAB — CBC
HEMATOCRIT: 26.4 % — AB (ref 35.0–47.0)
Hemoglobin: 8.6 g/dL — ABNORMAL LOW (ref 12.0–16.0)
MCH: 26.8 pg (ref 26.0–34.0)
MCHC: 32.5 g/dL (ref 32.0–36.0)
MCV: 82.4 fL (ref 80.0–100.0)
Platelets: 166 10*3/uL (ref 150–440)
RBC: 3.2 MIL/uL — ABNORMAL LOW (ref 3.80–5.20)
RDW: 16.3 % — ABNORMAL HIGH (ref 11.5–14.5)
WBC: 8.5 10*3/uL (ref 3.6–11.0)

## 2015-06-13 LAB — COMPREHENSIVE METABOLIC PANEL
ALT: 19 U/L (ref 14–54)
ANION GAP: 10 (ref 5–15)
AST: 21 U/L (ref 15–41)
Albumin: 1.6 g/dL — ABNORMAL LOW (ref 3.5–5.0)
Alkaline Phosphatase: 119 U/L (ref 38–126)
BUN: 50 mg/dL — AB (ref 6–20)
CALCIUM: 8 mg/dL — AB (ref 8.9–10.3)
CO2: 18 mmol/L — AB (ref 22–32)
CREATININE: 1.69 mg/dL — AB (ref 0.44–1.00)
Chloride: 110 mmol/L (ref 101–111)
GFR calc Af Amer: 35 mL/min — ABNORMAL LOW (ref >60)
GFR, EST NON AFRICAN AMERICAN: 30 mL/min — AB (ref >60)
GLUCOSE: 52 mg/dL — AB (ref 65–99)
Potassium: 4.1 mmol/L (ref 3.5–5.1)
Sodium: 138 mmol/L (ref 135–145)
TOTAL PROTEIN: 5.6 g/dL — AB (ref 6.5–8.1)
Total Bilirubin: 0.8 mg/dL (ref 0.3–1.2)

## 2015-06-13 LAB — TYPE AND SCREEN
ABO/RH(D): O POS
Antibody Screen: NEGATIVE
Unit division: 0

## 2015-06-13 LAB — GLUCOSE, CAPILLARY
GLUCOSE-CAPILLARY: 40 mg/dL — AB (ref 65–99)
GLUCOSE-CAPILLARY: 181 mg/dL — AB (ref 65–99)
GLUCOSE-CAPILLARY: 411 mg/dL — AB (ref 65–99)
Glucose-Capillary: 55 mg/dL — ABNORMAL LOW (ref 65–99)
Glucose-Capillary: 83 mg/dL (ref 65–99)
Glucose-Capillary: 357 mg/dL — ABNORMAL HIGH (ref 65–99)
Glucose-Capillary: 312 mg/dL — ABNORMAL HIGH (ref 65–99)

## 2015-06-13 LAB — C DIFFICILE QUICK SCREEN W PCR REFLEX
C Diff antigen: POSITIVE — AB
C Diff toxin: NEGATIVE

## 2015-06-13 MED ORDER — HYDROCORTISONE NA SUCCINATE PF 100 MG IJ SOLR
50.0000 mg | Freq: Three times a day (TID) | INTRAMUSCULAR | Status: DC
  Administered 2015-06-13 – 2015-06-16 (×10): 50 mg via INTRAVENOUS
  Filled 2015-06-13 (×8): qty 1
  Filled 2015-06-13 (×2): qty 2
  Filled 2015-06-13: qty 1
  Filled 2015-06-13: qty 2

## 2015-06-13 MED ORDER — AZTREONAM 1 G IJ SOLR
1.0000 g | Freq: Three times a day (TID) | INTRAMUSCULAR | Status: DC
  Administered 2015-06-13 – 2015-06-15 (×6): 1 g via INTRAVENOUS
  Filled 2015-06-13 (×10): qty 1

## 2015-06-13 MED ORDER — VANCOMYCIN HCL IN DEXTROSE 1-5 GM/200ML-% IV SOLN
1000.0000 mg | INTRAVENOUS | Status: DC
  Administered 2015-06-13 – 2015-06-14 (×2): 1000 mg via INTRAVENOUS
  Filled 2015-06-13 (×3): qty 200

## 2015-06-13 MED ORDER — HYDROCORTISONE NA SUCCINATE PF 100 MG IJ SOLR
100.0000 mg | INTRAMUSCULAR | Status: AC
Start: 2015-06-13 — End: 2015-06-13
  Administered 2015-06-13: 100 mg via INTRAVENOUS
  Filled 2015-06-13: qty 2

## 2015-06-13 NOTE — Progress Notes (Signed)
ANTIBIOTIC CONSULT NOTE - INITIAL  Pharmacy Consult for VANCOMCYIN/AZTREONAM/FLAGYL Indication: Sepsis, UTI, cellulits  Allergies  Allergen Reactions  . Macrobid [Nitrofurantoin Monohyd Macro] Itching, Rash and Swelling    PT HOSPITALIZED  . Penicillins Shortness Of Breath and Rash  . Tape Rash  . Clarithromycin Other (See Comments)    Abdominal pain  . Nitroglycerin Other (See Comments)    Reaction - unknown  . Leflunomide Rash  . Morphine Nausea And Vomiting    Nausea and vomiting  . Naproxen Rash  . Penicillin G Rash    Patient Measurements: Height: 5\' 7"  (170.2 cm) Weight: 192 lb 11.2 oz (87.408 kg) IBW/kg (Calculated) : 61.6 Adjusted Body Weight: 71.9  Vital Signs: Temp: 97.7 F (36.5 C) (07/24 1146) Temp Source: Oral (07/24 1146) BP: 124/55 mmHg (07/24 1146) Pulse Rate: 105 (07/24 1146) Intake/Output from previous day: 07/23 0701 - 07/24 0700 In: 1426 [P.O.:240; I.V.:700; Blood:286; IV Piggyback:200] Out: 2325 [Urine:2325] Intake/Output from this shift: Total I/O In: 600 [P.O.:600] Out: 800 [Urine:800]  Labs:  Recent Labs  06/11/15 2004 06/12/15 0400 06/12/15 1428 06/13/15 0416  WBC 13.1*  --   --  8.5  HGB 7.9*  --  9.0* 8.6*  PLT 173  --   --  166  CREATININE 2.65* 2.33*  --  1.69*   Estimated Creatinine Clearance: 35.7 mL/min (by C-G formula based on Cr of 1.69).  Microbiology: Recent Results (from the past 720 hour(s))  Urine culture     Status: None (Preliminary result)   Collection Time: 06/11/15 10:59 PM  Result Value Ref Range Status   Specimen Description URINE, RANDOM  Final   Special Requests NONE  Final   Culture   Final    >=100,000 COLONIES/mL GRAM NEGATIVE RODS IDENTIFICATION TO FOLLOW SUSCEPTIBILITIES TO FOLLOW    Report Status PENDING  Incomplete  Blood Culture (routine x 2)     Status: None (Preliminary result)   Collection Time: 06/12/15 12:23 AM  Result Value Ref Range Status   Specimen Description BLOOD LEFT ARM   Final   Special Requests BOTTLES DRAWN AEROBIC AND ANAEROBIC 2CC  Final   Culture NO GROWTH 1 DAY  Final   Report Status PENDING  Incomplete  Blood Culture (routine x 2)     Status: None (Preliminary result)   Collection Time: 06/12/15 12:38 AM  Result Value Ref Range Status   Specimen Description BLOOD RIGHT ARM  Final   Special Requests BOTTLES DRAWN AEROBIC AND ANAEROBIC 2CC  Final   Culture NO GROWTH 1 DAY  Final   Report Status PENDING  Incomplete    Medical History: Past Medical History  Diagnosis Date  . Arthritis   . Anemia   . GERD (gastroesophageal reflux disease)   . Heart disease   . Hypertension   . Nephrolithiasis   . Heel ulcer   . Diabetes mellitus without complication   . Hyperlipidemia   . Coronary artery disease     Medications:  Scheduled:  . aspirin EC  81 mg Oral Daily  . aztreonam  500 mg Intravenous 3 times per day  . folic acid  1 mg Oral BID  . heparin  5,000 Units Subcutaneous BID  . hydrocortisone sod succinate (SOLU-CORTEF) inj  50 mg Intravenous Q8H  . insulin aspart  0-9 Units Subcutaneous TID WC  . LORazepam  0.5 mg Oral 3 times per day  . metronidazole  500 mg Intravenous Q8H  . pantoprazole  40 mg Oral Daily  .  sodium chloride  3 mL Intravenous Q12H  . vancomycin  1,000 mg Intravenous Q24H   Anti-infectives    Start     Dose/Rate Route Frequency Ordered Stop   06/13/15 1730  vancomycin (VANCOCIN) IVPB 1000 mg/200 mL premix     1,000 mg 200 mL/hr over 60 Minutes Intravenous Every 24 hours 06/13/15 1258     06/12/15 1730  vancomycin (VANCOCIN) IVPB 1000 mg/200 mL premix  Status:  Discontinued     1,000 mg 200 mL/hr over 60 Minutes Intravenous Every 36 hours 06/12/15 0433 06/13/15 1258   06/12/15 0900  metroNIDAZOLE (FLAGYL) IVPB 500 mg     500 mg 100 mL/hr over 60 Minutes Intravenous Every 8 hours 06/12/15 0345     06/12/15 0800  aztreonam (AZACTAM) 500 mg in dextrose 5 % 50 mL IVPB     500 mg 100 mL/hr over 30 Minutes Intravenous  3 times per day 06/12/15 0357     06/11/15 2330  aztreonam (AZACTAM) 2 g in dextrose 5 % 50 mL IVPB     2 g 100 mL/hr over 30 Minutes Intravenous  Once 06/11/15 2318 06/12/15 0016   06/11/15 2330  metroNIDAZOLE (FLAGYL) IVPB 500 mg     500 mg 100 mL/hr over 60 Minutes Intravenous  Once 06/11/15 2318 06/12/15 0205   06/11/15 2330  vancomycin (VANCOCIN) IVPB 1000 mg/200 mL premix     1,000 mg 200 mL/hr over 60 Minutes Intravenous  Once 06/11/15 2318 06/12/15 3383     Assessment: 69 yo female with Sepsis, UTI, cellulits.  Goal of Therapy:  Vancomycin trough level 15-20 mcg/ml  Plan:  Measure antibiotic drug levels at steady state Follow up culture results follow up for descalation    Will continue Metronidazole 500mg  IV q8h  Current order for Aztreonam 500mg  IV q8h. Improvement in renal function to CrCl >30 ml/min. Will increase dose to aztreonam 1 g IV q8h.   Patient received Vancomycin 1 gram in ER. Current orders for vancomycin gram IV q36h with stacked dosing. Improvement in renal function (Ke 0.034, half life 20.3, Vd 50.4 L). Will change regimen to vancomycin 1 g IV q24h. Will check trough 7/26 at 1700.   Rayna Sexton, PharmD, BCPS Clinical Pharmacist 06/13/2015 12:59 PM

## 2015-06-13 NOTE — Progress Notes (Signed)
Patient alert and oriented x4, no c/o pain in buttocks, tylenol given. vss at this time. Patient NSR with BBB on telemetry. Will continue to assess. [name]

## 2015-06-13 NOTE — Progress Notes (Signed)
Dr. Caryl Comes notified of patient's CBG and treatment - no further interventions at this time. MD also aware of lower extremity edema possibly due to IV fluids - MD to assess renal function and place appropriate orders. Also notified of RUE swelling below prior IV site - continue to elevate and monitor. MD to round later this morning. Suzanne Ewing

## 2015-06-13 NOTE — Progress Notes (Signed)
Per MD order, '[employer]' was called for pressure-distributing bed to help heal/prevent pressure ulcers. Confirmation number [id number]. To be delivered. Toniann Ket

## 2015-06-13 NOTE — Clinical Documentation Improvement (Signed)
69yo female admitted with sepsis secondary to UTI and cellulitis. "Cellulitis" is documented throughout the chart.  "Cellulitis and abscess" is listed in the problem list.   When documenting cellulitis and/or abscess please include the following: . Describe if viral or bacterial (provide specific organism if known) . Specify anatomic site . Laterality: --Right --Left --Bilateral . Cellulitis of "other parts of limb," specify: --Right --Left --Upper --Lower . Cellulitis of the "cheek," specify: --Internal --External . Document any associated diagnoses/conditions  *You may use "possible", "probable", or "likey" with inpatient documentation.  Thank you,  Hartley Barefoot ,RN Clinical Documentation Specialist:  [phone]  [facility] Information Management

## 2015-06-13 NOTE — Progress Notes (Signed)
Hypoglycemic Event  CBG: 40  Treatment: 15 GM carbohydrate snack  Symptoms: None  CBG Result:53   --  Repeat Protocol:     --  Recheck: 83  Possible Reasons for Event: Inadequate meal intake  Comments/MD notified:Klien    Toniann Ket  Remember to initiate Hypoglycemia Order Set & complete

## 2015-06-13 NOTE — Progress Notes (Signed)
Patient ID: Suzanne Ewing, female   DOB: [birth year], 69 y.o.   MRN: [id number] SUBJECTIVE:  Admitted with abd pain, acute renal failure, hypotension, sepsis; pt with sacral and heel decubiti, with report that sacral decubitus appeared infected on admission.  Remains on 3 antibiotics, without fever.  Renal fxn better, but getting some foot edema.  Pain level about the same; c/o very poor energy.  Glucose low this AM; not eating well.  Not on standing dose meds for DM currently.   ______________________________________________________________________  ROS: Please see HPI; remainder of complete 10 point ROS is negative   Past Medical History  Diagnosis Date  . Arthritis   . Anemia   . GERD (gastroesophageal reflux disease)   . Heart disease   . Hypertension   . Nephrolithiasis   . Heel ulcer   . Diabetes mellitus without complication   . Hyperlipidemia   . Coronary artery disease     Past Surgical History  Procedure Laterality Date  . Coronary artery bypass graft    . Back surgery    . Cholecystectomy    . Shoulder surgery    . Knee surgery Left   . Carpal tunnel release Bilateral   . Joint replacement       Current facility-administered medications:  .  0.9 %  sodium chloride infusion, , Intravenous, Continuous, Tama High III, MD, Last Rate: 50 mL/hr at 06/13/15 1042 .  acetaminophen (TYLENOL) tablet 650 mg, 650 mg, Oral, Q6H PRN, 650 mg at 06/12/15 2951 **OR** acetaminophen (TYLENOL) suppository 650 mg, 650 mg, Rectal, Q6H PRN, Juluis Mire, MD .  aspirin EC tablet 81 mg, 81 mg, Oral, Daily, Juluis Mire, MD, 81 mg at 06/13/15 0947 .  aztreonam (AZACTAM) 500 mg in dextrose 5 % 50 mL IVPB, 500 mg, Intravenous, 3 times per day, Juluis Mire, MD, 500 mg at 06/13/15 0600 .  folic acid (FOLVITE) tablet 1 mg, 1 mg, Oral, BID, Juluis Mire, MD, 1 mg at 06/13/15 0947 .  heparin injection 5,000 Units, 5,000 Units, Subcutaneous, BID, Tama High III, MD, 5,000 Units  at 06/13/15 [phone] .  hydrocortisone sodium succinate (SOLU-CORTEF) 100 MG injection 100 mg, 100 mg, Intravenous, STAT, Tama High III, MD .  hydrocortisone sodium succinate (SOLU-CORTEF) 100 MG injection 50 mg, 50 mg, Intravenous, Q8H, Tama High III, MD .  insulin aspart (novoLOG) injection 0-9 Units, 0-9 Units, Subcutaneous, TID WC, Juluis Mire, MD, 2 Units at 06/12/15 1750 .  LORazepam (ATIVAN) tablet 0.5 mg, 0.5 mg, Oral, 3 times per day, Juluis Mire, MD, 0.5 mg at 06/12/15 2243 .  metroNIDAZOLE (FLAGYL) IVPB 500 mg, 500 mg, Intravenous, Q8H, Juluis Mire, MD, 500 mg at 06/13/15 0844 .  ondansetron (ZOFRAN) tablet 4 mg, 4 mg, Oral, Q6H PRN **OR** ondansetron (ZOFRAN) injection 4 mg, 4 mg, Intravenous, Q6H PRN, Juluis Mire, MD .  pantoprazole (PROTONIX) EC tablet 40 mg, 40 mg, Oral, Daily, Juluis Mire, MD, 40 mg at 06/13/15 0947 .  sodium chloride 0.9 % injection 3 mL, 3 mL, Intravenous, Q12H, Juluis Mire, MD, 3 mL at 06/12/15 2059 .  vancomycin (VANCOCIN) IVPB 1000 mg/200 mL premix, 1,000 mg, Intravenous, Q36H, Juluis Mire, MD, 1,000 mg at 06/12/15 1750  PHYSICAL EXAM:  BP 110/59 mmHg  Pulse 105  Temp(Src) 97.6 F (36.4 C) (Oral)  Resp 20  Ht 5\' 7"  (1.702 m)  Wt 87.408 kg (192 lb 11.2 oz)  BMI  30.17 kg/m2  SpO2 100%  General: Well developed, well nourished female, appears uncomfortable, but in NAD HEENT: PERRL; OP moist without lesions. Neck: supple, trachea midline, no thyromegaly Chest: normal to palpation Lungs: decreased airflow without retractions or wheezes Cardiovascular: RRR; distal pulses 1+ Abdomen: soft, nontender, positive bowel sounds; foley in place Extremities: no clubbing, cyanosis.  Left heel with dressing in place. 1+ ankle edema; edema in left wrist at area of IV infiltrate Neuro: alert, moves all extremities; generalized weakness Derm: 2 areas of breakdown on sacrum, appear to be stage 3 and 4; no purulence. Minimal  surrounding erythema. Lymph: no cervical or supraclavicular lymphadenopathy  Labs and imaging studies were reviewed  ASSESSMENT/PLAN:   1. Acute renal failure/dehydration/uti- renal failure better; will reduce IVF rate and follow.  Following urine culture 2. DM- hypoglycemia, worrisome for adrenal insufficiency.  Pt had been on 10 mg prednisone at home, not 1 mg as noted in H&P; prednisone was increased yesterday, but will convert to hydrocortisone and follow for effect.  cover with SSI if needed.  Encourage PO intake 3. RA- holding leflunomide due to acute illness 4. Decubitus ulcers- on broad spectrum abx; following blood cultures.  Await wound clinic input; may be able to narrow abx down to cover UTI if no further evidence of infection at site of decubiti. 5. GERD- cont pantoprazole. 6. Anemia- has been transfused; following cbc 7. CAD- off BP meds due to hypotension; consider statin when acute illness improved

## 2015-06-14 LAB — URINE CULTURE

## 2015-06-14 LAB — GLUCOSE, CAPILLARY
GLUCOSE-CAPILLARY: 129 mg/dL — AB (ref 65–99)
Glucose-Capillary: 212 mg/dL — ABNORMAL HIGH (ref 65–99)
Glucose-Capillary: 192 mg/dL — ABNORMAL HIGH (ref 65–99)
Glucose-Capillary: 167 mg/dL — ABNORMAL HIGH (ref 65–99)

## 2015-06-14 LAB — CBC
HEMATOCRIT: 27.4 % — AB (ref 35.0–47.0)
Hemoglobin: 8.7 g/dL — ABNORMAL LOW (ref 12.0–16.0)
MCH: 26.8 pg (ref 26.0–34.0)
MCHC: 31.6 g/dL — ABNORMAL LOW (ref 32.0–36.0)
MCV: 84.9 fL (ref 80.0–100.0)
Platelets: 204 10*3/uL (ref 150–440)
RBC: 3.22 MIL/uL — ABNORMAL LOW (ref 3.80–5.20)
RDW: 16.8 % — AB (ref 11.5–14.5)
WBC: 12.4 10*3/uL — ABNORMAL HIGH (ref 3.6–11.0)

## 2015-06-14 LAB — BASIC METABOLIC PANEL
Anion gap: 11 (ref 5–15)
BUN: 41 mg/dL — ABNORMAL HIGH (ref 6–20)
CALCIUM: 8.4 mg/dL — AB (ref 8.9–10.3)
CHLORIDE: 114 mmol/L — AB (ref 101–111)
CO2: 16 mmol/L — ABNORMAL LOW (ref 22–32)
Creatinine, Ser: 1.22 mg/dL — ABNORMAL HIGH (ref 0.44–1.00)
GFR calc Af Amer: 51 mL/min — ABNORMAL LOW (ref >60)
GFR, EST NON AFRICAN AMERICAN: 44 mL/min — AB (ref >60)
GLUCOSE: 113 mg/dL — AB (ref 65–99)
Potassium: 4.3 mmol/L (ref 3.5–5.1)
Sodium: 141 mmol/L (ref 135–145)

## 2015-06-14 LAB — CLOSTRIDIUM DIFFICILE BY PCR: Toxigenic C. Difficile by PCR: NEGATIVE

## 2015-06-14 MED ORDER — COLLAGENASE 250 UNIT/GM EX OINT
TOPICAL_OINTMENT | Freq: Every day | CUTANEOUS | Status: DC
  Administered 2015-06-14 – 2015-06-18 (×5): via TOPICAL
  Filled 2015-06-14: qty 30

## 2015-06-14 NOTE — Care Management (Addendum)
Patient is currently followed by [facility].  Followed for wound care. Agency notified of admission.  Patient is being followed by a wound care center at [facility] in [place] and has an appointment for 8/8 for what sounds like a debridement.  Patient and husband very concerned that patient will not be able to keep this appointment.  Patient had treatment started last week for UTI and patient became progressively weaker.  She says she was "up and about" until last week.  She uses a scooter and wheelchair. Patient does not have chronic home 02 or walker.  Currently being treated for sepsis.  At present, would anticipate discharge with resumption of home health

## 2015-06-14 NOTE — Care Management Important Message (Signed)
Important Message  Patient Details  Name: Suzanne Ewing MRN: [id number] Date of Birth: 1946/05/11   Medicare Important Message Given:  Yes-second notification given    Juliann Pulse A Allmond 06/14/2015, 11:55 AM

## 2015-06-14 NOTE — Consult Note (Signed)
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   Stage II pressure ulcer to buttocks and unstageable pressure ulcer to left buttocks, near gluteal fold.  Wound type:Pressure and neuropathic ulcer Pressure Ulcer POA: Yes Measurement:Left heel, plantar aspect near heel:  3.3 cm linear length x 1 cm x 2 cm circular opening at the base. Seen by [facility] and using silver collagen dressing.  Left buttocks has 3 lesions:  Scarring 2 cm x 2 cm intact left buttock 3 cm x 2 cm x 1 cm (unable to visualize deepest aspect due to the presence of slough) Stage II shearing injury to left buttocks 2 cm x 1.2 cm x 0.1 cm  Drainage (amount, consistency, odor) Moderate serosanguinous drainage Periwound:intact Dressing procedure/placement/frequency:Cleanse left foot ulcer with NS and pat gently dry.  Gently fill opening with Iodoform packing strip (and linear wound base as well).  Cover with 4x4 and kerlix.  Secure with tape.  Change daily.   Cleanse buttock lesions with NS and pat gently dry.  Apply Santyl to full thickness wound (Covered in yellow slough).  1/8 inch thickness (opaque).  Gently fill wound depth with NS moist gauze and cover with ABD pad. Change daily.  Barrier cream to all other buttock lesions.  Patient is on LALM. Will not follow at this time.  Please re-consult if needed.  Domenic Moras RN BSN [place] Pager [phone]

## 2015-06-14 NOTE — Progress Notes (Signed)
Patient ID: Suzanne Ewing, female   DOB: [birth year], 69 y.o.   MRN: [id number] SUBJECTIVE:  Admitted with abdominal pain, acute renal failure, hypotension, sepsis, sacral and heel decubiti, with report that sacral decubitus appeared infected on admission.  Afebrile. Poor appetite and low energy level.     Past Medical History  Diagnosis Date  . Arthritis   . Anemia   . GERD (gastroesophageal reflux disease)   . Heart disease   . Hypertension   . Nephrolithiasis   . Heel ulcer   . Diabetes mellitus without complication   . Hyperlipidemia   . Coronary artery disease     Past Surgical History  Procedure Laterality Date  . Coronary artery bypass graft    . Back surgery    . Cholecystectomy    . Shoulder surgery    . Knee surgery Left   . Carpal tunnel release Bilateral   . Joint replacement     Scheduled Meds: . aspirin EC  81 mg Oral Daily  . aztreonam  1 g Intravenous 3 times per day  . collagenase   Topical Daily  . folic acid  1 mg Oral BID  . heparin  5,000 Units Subcutaneous BID  . hydrocortisone sod succinate (SOLU-CORTEF) inj  50 mg Intravenous Q8H  . insulin aspart  0-9 Units Subcutaneous TID WC  . LORazepam  0.5 mg Oral 3 times per day  . metronidazole  500 mg Intravenous Q8H  . pantoprazole  40 mg Oral Daily  . sodium chloride  3 mL Intravenous Q12H  . vancomycin  1,000 mg Intravenous Q24H   Continuous Infusions: . sodium chloride 50 mL/hr at 06/14/15 0434   PRN Meds:.acetaminophen **OR** acetaminophen, ondansetron **OR** ondansetron (ZOFRAN) IV   EXAM:  BP 120/54 mmHg  Pulse 64  Temp(Src) 98.3 F (36.8 C) (Oral)  Resp 20  Ht 5\' 7"  (1.702 m)  Wt 80 kg (176 lb 5.9 oz)  BMI 27.62 kg/m2  SpO2 98%  General: alert, oriented x 3, NAD HENT: no JVD, no pallor, no icterus,  no cervical lymphadenopathy Chest: clear to auscultation CVS: RRR, S1S2, no tachycardia Abdomen: soft, nontender, no hepatosplenomegaly Ext: mild bilateral pedal edema.  SKIN:  stage III decubitus ulcer, 2 cm diameter, on postero-lateral aspect of left foot. Stage stage III-IV large irregular decubitus ulcer, 4-5 cm diameter on left buttock.   Neuro: moving all extremities.  Labs and imaging studies were reviewed  ASSESSMENT/PLAN:   1. Acute renal failure and dehydration: renal function has improved with IV fluids.  2. UTI - gram negative rod on preliminary urine c/s report. Following urine culture. 3. Decubitus ulcers- on broad spectrum abx; following blood cultures. Wound debridement and dressing by wound care team.  4. DM- initial hypoglycemia, started on SoluCortef for possible adrenal insufficiency. Shall continue close blood sugar monitoring with sliding scale coverage.  5. GERD- cont pantoprazole. 6. CAD- BP meds held for low BP, shall monitor and resume; consider statin when acute illness improved 7. RA- Leflunomide on hold due to acute illness

## 2015-06-14 NOTE — Progress Notes (Signed)
Inpatient Diabetes Program Recommendations  AACE/ADA: New Consensus Statement on Inpatient Glycemic Control (2013)  Target Ranges:  Prepandial:   less than 140 mg/dL      Peak postprandial:   less than 180 mg/dL (1-2 hours)      Critically ill patients:  140 - 180 mg/dL   Results for Suzanne Ewing, Suzanne Ewing (MRN [id number]) as of 06/14/2015 10:14  Ref. Range 06/13/2015 08:33 06/13/2015 08:58 06/13/2015 09:19 06/13/2015 11:47 06/13/2015 16:36 06/13/2015 16:45 06/13/2015 20:28 06/14/2015 08:03  Glucose-Capillary Latest Ref Range: 65-99 mg/dL 40 (LL) 55 (L) 83 181 (H) 411 (H) 357 (H) 312 (H) 129 (H)    Diabetes history: DM2 Outpatient Diabetes medications: Amaryl 6mg  QAM, Humulin N 26 units daily, Humalog 4 units with breakfast, 4 units with lunch, and 12 units with supper Current orders for Inpatient glycemic control: Novolog 0-9 units TID with meals  Inpatient Diabetes Program Recommendations Insulin - Meal Coverage: Currently patient is ordered Solucortef 50 mg Q8H which is contributing to elevated post prandial glucose levels. If steroids will be continued as ordered, please consider ordering Novolog 4 units TID with meals for meal coverage if patient eats at least 50% of meal.  HgbA1C: Please consider ordering an A1C to evaluate glycemic control over the past 2-3 months.  Thanks, Barnie Alderman, RN, MSN, CCRN, CDE Diabetes Coordinator Inpatient Diabetes Program [phone] (Team Pager from [place] to [place]) [phone] ([facility] office) [phone] [facility] office) [phone] [facility] office)

## 2015-06-14 NOTE — Progress Notes (Signed)
Patient urine cx growing ESBL/ECOLI   DR Candiss Norse was informed and pt's place on isolation will continue to monitor

## 2015-06-15 ENCOUNTER — Other Ambulatory Visit: Payer: Self-pay | Admitting: Plastic Surgery

## 2015-06-15 DIAGNOSIS — L97522 Non-pressure chronic ulcer of other part of left foot with fat layer exposed: Secondary | ICD-10-CM

## 2015-06-15 LAB — GLUCOSE, CAPILLARY
GLUCOSE-CAPILLARY: 88 mg/dL (ref 65–99)
Glucose-Capillary: 51 mg/dL — ABNORMAL LOW (ref 65–99)
Glucose-Capillary: 83 mg/dL (ref 65–99)
Glucose-Capillary: 142 mg/dL — ABNORMAL HIGH (ref 65–99)
Glucose-Capillary: 155 mg/dL — ABNORMAL HIGH (ref 65–99)

## 2015-06-15 LAB — C DIFFICILE QUICK SCREEN W PCR REFLEX
C DIFFICILE (CDIFF) TOXIN: NEGATIVE
C Diff antigen: NEGATIVE
C Diff interpretation: NEGATIVE

## 2015-06-15 MED ORDER — LOPERAMIDE HCL 2 MG PO CAPS
2.0000 mg | ORAL_CAPSULE | ORAL | Status: DC | PRN
  Administered 2015-06-15 – 2015-06-16 (×3): 2 mg via ORAL
  Filled 2015-06-15 (×3): qty 1

## 2015-06-15 MED ORDER — SODIUM CHLORIDE 0.9 % IV SOLN
1.0000 g | Freq: Two times a day (BID) | INTRAVENOUS | Status: DC
  Administered 2015-06-15 (×2): 1 g via INTRAVENOUS
  Filled 2015-06-15 (×5): qty 1

## 2015-06-15 NOTE — Plan of Care (Signed)
Problem: Infection Goal: Absence of diarrhea, constipation, and skin breakdown Outcome: Not Progressing Pt continues to have multiple diarrhea episodes throughout the night. Pt continues antibiotic meds and Continuous infusion of NS @ 35mL/hr. Pt is NSR in 100s, BP 140-150/60-70s, on RA SpO2 >92%.

## 2015-06-15 NOTE — Progress Notes (Signed)
Patient ID: Suzanne Ewing, female   DOB: 1946/01/09, 69 y.o.   MRN: [id number] SUBJECTIVE:  Admitted with abdominal pain, acute renal failure, hypotension, sepsis, sacral and heel decubiti. Multiple loose stools. C. Diff negative. Afebrile. Poor appetite and low energy level.     Past Medical History  Diagnosis Date  . Arthritis   . Anemia   . GERD (gastroesophageal reflux disease)   . Heart disease   . Hypertension   . Nephrolithiasis   . Heel ulcer   . Diabetes mellitus without complication   . Hyperlipidemia   . Coronary artery disease     Past Surgical History  Procedure Laterality Date  . Coronary artery bypass graft    . Back surgery    . Cholecystectomy    . Shoulder surgery    . Knee surgery Left   . Carpal tunnel release Bilateral   . Joint replacement      Scheduled Meds: . aspirin EC  81 mg Oral Daily  . collagenase   Topical Daily  . folic acid  1 mg Oral BID  . heparin  5,000 Units Subcutaneous BID  . hydrocortisone sod succinate (SOLU-CORTEF) inj  50 mg Intravenous Q8H  . insulin aspart  0-9 Units Subcutaneous TID WC  . LORazepam  0.5 mg Oral 3 times per day  . meropenem (MERREM) IV  1 g Intravenous Q12H  . pantoprazole  40 mg Oral Daily  . sodium chloride  3 mL Intravenous Q12H   Continuous Infusions: . sodium chloride 50 mL/hr at 06/15/15 0627   PRN Meds:.acetaminophen **OR** acetaminophen, loperamide, ondansetron **OR** ondansetron (ZOFRAN) IV   EXAM:  BP 146/57 mmHg  Pulse 101  Temp(Src) 98.2 F (36.8 C) (Oral)  Resp 18  Ht 5\' 7"  (1.702 m)  Wt 80 kg (176 lb 5.9 oz)  BMI 27.62 kg/m2  SpO2 100%  General: alert, oriented x 3, NAD HENT: no JVD, no pallor, no icterus,  no cervical lymphadenopathy Chest: clear to auscultation CVS: RRR, S1S2, no tachycardia Abdomen: soft, nontender, no hepatosplenomegaly Ext: mild bilateral pedal edema.  SKIN: stage III decubitus ulcer, 2 cm diameter, on postero-lateral aspect of left foot. Stage stage  III-IV large irregular decubitus ulcer, 4-5 cm diameter on left buttock.   Neuro: moving all extremities.  Labs and imaging studies were reviewed  ASSESSMENT/PLAN:   1. Acute renal failure and dehydration: renal function has improved gradually with IV fluids, repeat CMP in AM.  2. E.coli UTI -ESBL resistant to Aztreonam on urine c/s, started on Meropenem. Blood c/s negative thus far. Diarrhea: C. Diff negative. Stool c/s pending. Flagyl, Vancomycin and Aztreonam discontinued.  3. Decubitus ulcers- continue woundcare and broad spectrum antibiotic coverage with Meropenem; following blood cultures. 4. 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 Shall continue close blood sugar monitoring with sliding scale coverage.  5. GERD- cont pantoprazole. 6. CAD- resume statin when acute illness improved 7. RA- Leflunomide on hold due to acute illness

## 2015-06-15 NOTE — Plan of Care (Signed)
Problem: Phase I Progression Outcomes Goal: Other Phase I Outcomes/Goals Outcome: Progressing Pt is alert and oriented x 4, irritable at times, husband at bedside, on room air, vital signs stable, hypoglycemia in the morning improved with apple juice, held insulin coverage do to poor appetite, loose stools in am following apple juice, denies pain, wound care perfomred on foot and buttocks. Afebrile throughout shift, IV antibiotics adjusted, receiving IV fluids. Remains on contact, stool labs sent off for culture.

## 2015-06-15 NOTE — Progress Notes (Addendum)
ANTIBIOTIC CONSULT NOTE - Initial  Pharmacy Consult for meropenem Indication: Sepsis, UTI, cellulits  Allergies  Allergen Reactions  . Macrobid [Nitrofurantoin Monohyd Macro] Itching, Rash and Swelling    PT HOSPITALIZED  . Penicillins Shortness Of Breath and Rash  . Tape Rash  . Clarithromycin Other (See Comments)    Abdominal pain  . Nitroglycerin Other (See Comments)    Reaction - unknown  . Leflunomide Rash  . Morphine Nausea And Vomiting    Nausea and vomiting  . Naproxen Rash  . Penicillin G Rash    Patient Measurements: Height: 5\' 7"  (170.2 cm) Weight: 176 lb 5.9 oz (80 kg) IBW/kg (Calculated) : 61.6 Adjusted Body Weight: 71.9  Vital Signs: Temp: 98.2 F (36.8 C) (07/26 1151) Temp Source: Oral (07/26 1151) BP: 146/57 mmHg (07/26 1151) Pulse Rate: 101 (07/26 1151) Intake/Output from previous day: 07/25 0701 - 07/26 0700 In: 1580 [P.O.:480; I.V.:500; IV Piggyback:600] Out: 2900 [Urine:2900] Intake/Output from this shift:    Labs:  Recent Labs  06/12/15 1428 06/13/15 0416 06/14/15 0422  WBC  --  8.5 12.4*  HGB 9.0* 8.6* 8.7*  PLT  --  166 204  CREATININE  --  1.69* 1.22*   Estimated Creatinine Clearance: 47.4 mL/min (by C-G formula based on Cr of 1.22).  Microbiology: Recent Results (from the past 720 hour(s))  Urine culture     Status: None   Collection Time: 06/11/15 10:59 PM  Result Value Ref Range Status   Specimen Description URINE, RANDOM  Final   Special Requests NONE  Final   Culture   Final    >=100,000 COLONIES/mL ESCHERICHIA COLI ESBL-EXTENDED SPECTRUM BETA LACTAMASE-THE ORGANISM IS RESISTANT TO PENICILLINS, CEPHALOSPORINS AND AZTREONAM ACCORDING TO CLSI M100-S15 VOL.[place]. Results Called to: Georgeanna Lea AT 0623 06/14/15 CTJ    Report Status 06/14/2015 FINAL  Final   Organism ID, Bacteria ESCHERICHIA COLI  Final      Susceptibility   Escherichia coli - MIC*    AMPICILLIN >=32 RESISTANT Resistant     CEFTAZIDIME 4  RESISTANT Resistant     CEFAZOLIN >=64 RESISTANT Resistant     CEFTRIAXONE >=64 RESISTANT Resistant     CIPROFLOXACIN >=4 RESISTANT Resistant     GENTAMICIN <=1 SENSITIVE Sensitive     IMIPENEM <=0.25 SENSITIVE Sensitive     TRIMETH/SULFA >=320 RESISTANT Resistant     Extended ESBL POSITIVE Resistant     NITROFURANTOIN Value in next row Sensitive      SENSITIVE<=16    PIP/TAZO Value in next row Sensitive      SENSITIVE<=4    AMPICILLIN/SULBACTAM Value in next row Sensitive      SENSITIVE8    * >=100,000 COLONIES/mL ESCHERICHIA COLI  Blood Culture (routine x 2)     Status: None (Preliminary result)   Collection Time: 06/12/15 12:23 AM  Result Value Ref Range Status   Specimen Description BLOOD LEFT ARM  Final   Special Requests BOTTLES DRAWN AEROBIC AND ANAEROBIC 2CC  Final   Culture NO GROWTH 3 DAYS  Final   Report Status PENDING  Incomplete  Blood Culture (routine x 2)     Status: None (Preliminary result)   Collection Time: 06/12/15 12:38 AM  Result Value Ref Range Status   Specimen Description BLOOD RIGHT ARM  Final   Special Requests BOTTLES DRAWN AEROBIC AND ANAEROBIC 2CC  Final   Culture NO GROWTH 3 DAYS  Final   Report Status PENDING  Incomplete  C difficile quick scan w PCR  reflex ([place] only)     Status: Abnormal   Collection Time: 06/13/15 10:58 AM  Result Value Ref Range Status   C Diff antigen POSITIVE (A) NEGATIVE Final   C Diff toxin NEGATIVE NEGATIVE Final   C Diff interpretation   Final    Negative for toxigenic C. difficile. Toxin gene and active toxin production not detected. May be a nontoxigenic strain of C. difficile bacteria present, lacking the ability to produce toxin.  Clostridium Difficile by PCR (not at [facility])     Status: None   Collection Time: 06/13/15 10:58 AM  Result Value Ref Range Status   C difficile by pcr NEGATIVE NEGATIVE Final  C difficile quick scan w PCR reflex ([facility] only)     Status: None   Collection Time: 06/15/15  9:41 AM  Result  Value Ref Range Status   C Diff antigen NEGATIVE NEGATIVE Final   C Diff toxin NEGATIVE NEGATIVE Final   C Diff interpretation Negative for C. difficile  Final    Medical History: Past Medical History  Diagnosis Date  . Arthritis   . Anemia   . GERD (gastroesophageal reflux disease)   . Heart disease   . Hypertension   . Nephrolithiasis   . Heel ulcer   . Diabetes mellitus without complication   . Hyperlipidemia   . Coronary artery disease     Medications:  Scheduled:  . aspirin EC  81 mg Oral Daily  . collagenase   Topical Daily  . folic acid  1 mg Oral BID  . heparin  5,000 Units Subcutaneous BID  . hydrocortisone sod succinate (SOLU-CORTEF) inj  50 mg Intravenous Q8H  . insulin aspart  0-9 Units Subcutaneous TID WC  . LORazepam  0.5 mg Oral 3 times per day  . meropenem (MERREM) IV  1 g Intravenous Q12H  . pantoprazole  40 mg Oral Daily  . sodium chloride  3 mL Intravenous Q12H   Anti-infectives    Start     Dose/Rate Route Frequency Ordered Stop   06/15/15 1215  meropenem (MERREM) 1 g in sodium chloride 0.9 % 100 mL IVPB     1 g 200 mL/hr over 30 Minutes Intravenous Every 12 hours 06/15/15 1204     06/13/15 1730  vancomycin (VANCOCIN) IVPB 1000 mg/200 mL premix  Status:  Discontinued     1,000 mg 200 mL/hr over 60 Minutes Intravenous Every 24 hours 06/13/15 1258 06/15/15 1205   06/13/15 1400  aztreonam (AZACTAM) 1 g in dextrose 5 % 50 mL IVPB  Status:  Discontinued     1 g 100 mL/hr over 30 Minutes Intravenous 3 times per day 06/13/15 1301 06/15/15 1205   06/12/15 1730  vancomycin (VANCOCIN) IVPB 1000 mg/200 mL premix  Status:  Discontinued     1,000 mg 200 mL/hr over 60 Minutes Intravenous Every 36 hours 06/12/15 0433 06/13/15 1258   06/12/15 0900  metroNIDAZOLE (FLAGYL) IVPB 500 mg  Status:  Discontinued     500 mg 100 mL/hr over 60 Minutes Intravenous Every 8 hours 06/12/15 0345 06/15/15 1205   06/12/15 0800  aztreonam (AZACTAM) 500 mg in dextrose 5 % 50 mL  IVPB  Status:  Discontinued     500 mg 100 mL/hr over 30 Minutes Intravenous 3 times per day 06/12/15 0357 06/13/15 1301   06/11/15 2330  aztreonam (AZACTAM) 2 g in dextrose 5 % 50 mL IVPB     2 g 100 mL/hr over 30 Minutes Intravenous  Once 06/11/15  2318 06/12/15 0016   06/11/15 2330  metroNIDAZOLE (FLAGYL) IVPB 500 mg     500 mg 100 mL/hr over 60 Minutes Intravenous  Once 06/11/15 2318 06/12/15 0205   06/11/15 2330  vancomycin (VANCOCIN) IVPB 1000 mg/200 mL premix     1,000 mg 200 mL/hr over 60 Minutes Intravenous  Once 06/11/15 2318 06/12/15 3151     Assessment: 69 yo female with Sepsis, UTI, cellulits. Abx vanc/aztreonam/Flagyl narrowed to meropenem. Spoke with Dr. Candiss Norse about allergic reaction of shortness of breath and rash to PCN, MD ok with starting meropenem. Called RN and asked her to monitor pt for s/sx of allergic reaction with first dose.   UCx ESBL E coli sensitive to imipenem BCx x2 NGTD x3 days Cdiff neg  Goal of Therapy:  Vancomycin trough level 15-20 mcg/ml  Plan:  Will order meropenem 1 g IV q12h based on current renal function.    Rayna Sexton, PharmD, BCPS Clinical Pharmacist 06/15/2015 12:05 PM

## 2015-06-15 NOTE — Progress Notes (Signed)
Notified MD that pt is having continuous loose stools and does not want a rectal tube, Per MD recheck for C.diff, stool culture and WBC, loperamide 2mg  prn loose stools q8h. Telephoneorder epr Dr. Glendon Axe

## 2015-06-15 NOTE — Progress Notes (Signed)
ANTIBIOTIC CONSULT NOTE - Follow up  Pharmacy Consult for VANCOMCYIN/AZTREONAM/FLAGYL Indication: Sepsis, UTI, cellulits  Allergies  Allergen Reactions  . Macrobid [Nitrofurantoin Monohyd Macro] Itching, Rash and Swelling    PT HOSPITALIZED  . Penicillins Shortness Of Breath and Rash  . Tape Rash  . Clarithromycin Other (See Comments)    Abdominal pain  . Nitroglycerin Other (See Comments)    Reaction - unknown  . Leflunomide Rash  . Morphine Nausea And Vomiting    Nausea and vomiting  . Naproxen Rash  . Penicillin G Rash    Patient Measurements: Height: 5\' 7"  (170.2 cm) Weight: 176 lb 5.9 oz (80 kg) IBW/kg (Calculated) : 61.6 Adjusted Body Weight: 71.9  Vital Signs: Temp: 98.6 F (37 C) (07/26 0410) Temp Source: Oral (07/26 0410) BP: 158/69 mmHg (07/26 0410) Pulse Rate: 106 (07/26 0410) Intake/Output from previous day: 07/25 0701 - 07/26 0700 In: 1580 [P.O.:480; I.V.:500; IV Piggyback:600] Out: 2900 [Urine:2900] Intake/Output from this shift:    Labs:  Recent Labs  06/12/15 1428 06/13/15 0416 06/14/15 0422  WBC  --  8.5 12.4*  HGB 9.0* 8.6* 8.7*  PLT  --  166 204  CREATININE  --  1.69* 1.22*   Estimated Creatinine Clearance: 47.4 mL/min (by C-G formula based on Cr of 1.22).  Microbiology: Recent Results (from the past 720 hour(s))  Urine culture     Status: None   Collection Time: 06/11/15 10:59 PM  Result Value Ref Range Status   Specimen Description URINE, RANDOM  Final   Special Requests NONE  Final   Culture   Final    >=100,000 COLONIES/mL ESCHERICHIA COLI ESBL-EXTENDED SPECTRUM BETA LACTAMASE-THE ORGANISM IS RESISTANT TO PENICILLINS, CEPHALOSPORINS AND AZTREONAM ACCORDING TO CLSI M100-S15 VOL.[place]. Results Called to: Georgeanna Lea AT 1191 06/14/15 CTJ    Report Status 06/14/2015 FINAL  Final   Organism ID, Bacteria ESCHERICHIA COLI  Final      Susceptibility   Escherichia coli - MIC*    AMPICILLIN >=32 RESISTANT Resistant    CEFTAZIDIME 4 RESISTANT Resistant     CEFAZOLIN >=64 RESISTANT Resistant     CEFTRIAXONE >=64 RESISTANT Resistant     CIPROFLOXACIN >=4 RESISTANT Resistant     GENTAMICIN <=1 SENSITIVE Sensitive     IMIPENEM <=0.25 SENSITIVE Sensitive     TRIMETH/SULFA >=320 RESISTANT Resistant     Extended ESBL POSITIVE Resistant     NITROFURANTOIN Value in next row Sensitive      SENSITIVE<=16    PIP/TAZO Value in next row Sensitive      SENSITIVE<=4    AMPICILLIN/SULBACTAM Value in next row Sensitive      SENSITIVE8    * >=100,000 COLONIES/mL ESCHERICHIA COLI  Blood Culture (routine x 2)     Status: None (Preliminary result)   Collection Time: 06/12/15 12:23 AM  Result Value Ref Range Status   Specimen Description BLOOD LEFT ARM  Final   Special Requests BOTTLES DRAWN AEROBIC AND ANAEROBIC 2CC  Final   Culture NO GROWTH 3 DAYS  Final   Report Status PENDING  Incomplete  Blood Culture (routine x 2)     Status: None (Preliminary result)   Collection Time: 06/12/15 12:38 AM  Result Value Ref Range Status   Specimen Description BLOOD RIGHT ARM  Final   Special Requests BOTTLES DRAWN AEROBIC AND ANAEROBIC 2CC  Final   Culture NO GROWTH 3 DAYS  Final   Report Status PENDING  Incomplete  C difficile quick scan w PCR reflex (  [place] only)     Status: Abnormal   Collection Time: 06/13/15 10:58 AM  Result Value Ref Range Status   C Diff antigen POSITIVE (A) NEGATIVE Final   C Diff toxin NEGATIVE NEGATIVE Final   C Diff interpretation   Final    Negative for toxigenic C. difficile. Toxin gene and active toxin production not detected. May be a nontoxigenic strain of C. difficile bacteria present, lacking the ability to produce toxin.  Clostridium Difficile by PCR (not at [facility])     Status: None   Collection Time: 06/13/15 10:58 AM  Result Value Ref Range Status   C difficile by pcr NEGATIVE NEGATIVE Final    Medical History: Past Medical History  Diagnosis Date  . Arthritis   . Anemia   . GERD  (gastroesophageal reflux disease)   . Heart disease   . Hypertension   . Nephrolithiasis   . Heel ulcer   . Diabetes mellitus without complication   . Hyperlipidemia   . Coronary artery disease     Medications:  Scheduled:  . aspirin EC  81 mg Oral Daily  . aztreonam  1 g Intravenous 3 times per day  . collagenase   Topical Daily  . folic acid  1 mg Oral BID  . heparin  5,000 Units Subcutaneous BID  . hydrocortisone sod succinate (SOLU-CORTEF) inj  50 mg Intravenous Q8H  . insulin aspart  0-9 Units Subcutaneous TID WC  . LORazepam  0.5 mg Oral 3 times per day  . metronidazole  500 mg Intravenous Q8H  . pantoprazole  40 mg Oral Daily  . sodium chloride  3 mL Intravenous Q12H  . vancomycin  1,000 mg Intravenous Q24H   Anti-infectives    Start     Dose/Rate Route Frequency Ordered Stop   06/13/15 1730  vancomycin (VANCOCIN) IVPB 1000 mg/200 mL premix     1,000 mg 200 mL/hr over 60 Minutes Intravenous Every 24 hours 06/13/15 1258     06/13/15 1400  aztreonam (AZACTAM) 1 g in dextrose 5 % 50 mL IVPB     1 g 100 mL/hr over 30 Minutes Intravenous 3 times per day 06/13/15 1301     06/12/15 1730  vancomycin (VANCOCIN) IVPB 1000 mg/200 mL premix  Status:  Discontinued     1,000 mg 200 mL/hr over 60 Minutes Intravenous Every 36 hours 06/12/15 0433 06/13/15 1258   06/12/15 0900  metroNIDAZOLE (FLAGYL) IVPB 500 mg     500 mg 100 mL/hr over 60 Minutes Intravenous Every 8 hours 06/12/15 0345     06/12/15 0800  aztreonam (AZACTAM) 500 mg in dextrose 5 % 50 mL IVPB  Status:  Discontinued     500 mg 100 mL/hr over 30 Minutes Intravenous 3 times per day 06/12/15 0357 06/13/15 1301   06/11/15 2330  aztreonam (AZACTAM) 2 g in dextrose 5 % 50 mL IVPB     2 g 100 mL/hr over 30 Minutes Intravenous  Once 06/11/15 2318 06/12/15 0016   06/11/15 2330  metroNIDAZOLE (FLAGYL) IVPB 500 mg     500 mg 100 mL/hr over 60 Minutes Intravenous  Once 06/11/15 2318 06/12/15 0205   06/11/15 2330  vancomycin  (VANCOCIN) IVPB 1000 mg/200 mL premix     1,000 mg 200 mL/hr over 60 Minutes Intravenous  Once 06/11/15 2318 06/12/15 1610     Assessment: 69 yo female with Sepsis, UTI, cellulits.  Goal of Therapy:  Vancomycin trough level 15-20 mcg/ml  Plan:  Measure antibiotic drug  levels at steady state Follow up culture results follow up for descalation    Will continue Metronidazole 500mg  IV q8h  Current order for Aztreonam 500mg  IV q8h. Improvement in renal function to CrCl >30 ml/min. Will increase dose to aztreonam 1 g IV q8h.   Patient received Vancomycin 1 gram in ER. Current orders for vancomycin gram IV q36h with stacked dosing. Improvement in renal function (Ke 0.034, half life 20.3, Vd 50.4 L). Regimen changed to vancomycin 1 g IV q24 h on 7/24.  Further improvement in SCr yesterday, all doses before trough charted as given. Will check trough 7/26 at 1700.   Rayna Sexton, PharmD, BCPS Clinical Pharmacist 06/15/2015 9:12 AM

## 2015-06-15 NOTE — Care Management (Signed)
Discussed the  Possible benefit of  gi consult during progression.  Patient has had diarrhea for one prior prior to admission.  Patient has ruled out for C diff this admission

## 2015-06-16 LAB — GLUCOSE, CAPILLARY
GLUCOSE-CAPILLARY: 203 mg/dL — AB (ref 65–99)
Glucose-Capillary: 80 mg/dL (ref 65–99)
Glucose-Capillary: 170 mg/dL — ABNORMAL HIGH (ref 65–99)
Glucose-Capillary: 144 mg/dL — ABNORMAL HIGH (ref 65–99)

## 2015-06-16 LAB — CBC WITH DIFFERENTIAL/PLATELET
BASOS PCT: 0 %
Basophils Absolute: 0 10*3/uL (ref 0–0.1)
EOS ABS: 0 10*3/uL (ref 0–0.7)
EOS PCT: 0 %
HCT: 30.5 % — ABNORMAL LOW (ref 35.0–47.0)
Hemoglobin: 9.3 g/dL — ABNORMAL LOW (ref 12.0–16.0)
LYMPHS ABS: 0.6 10*3/uL — AB (ref 1.0–3.6)
LYMPHS PCT: 6 %
MCH: 26 pg (ref 26.0–34.0)
MCHC: 30.6 g/dL — AB (ref 32.0–36.0)
MCV: 85 fL (ref 80.0–100.0)
Monocytes Absolute: 0.3 10*3/uL (ref 0.2–0.9)
Monocytes Relative: 3 %
NEUTROS ABS: 9 10*3/uL — AB (ref 1.4–6.5)
Neutrophils Relative %: 91 %
Platelets: 198 10*3/uL (ref 150–440)
RBC: 3.59 MIL/uL — ABNORMAL LOW (ref 3.80–5.20)
RDW: 16.8 % — ABNORMAL HIGH (ref 11.5–14.5)
WBC: 10 10*3/uL (ref 3.6–11.0)

## 2015-06-16 LAB — COMPREHENSIVE METABOLIC PANEL
ALK PHOS: 114 U/L (ref 38–126)
ALT: 15 U/L (ref 14–54)
ANION GAP: 7 (ref 5–15)
AST: 18 U/L (ref 15–41)
Albumin: 1.9 g/dL — ABNORMAL LOW (ref 3.5–5.0)
BUN: 32 mg/dL — AB (ref 6–20)
CO2: 20 mmol/L — ABNORMAL LOW (ref 22–32)
CREATININE: 0.7 mg/dL (ref 0.44–1.00)
Calcium: 8.3 mg/dL — ABNORMAL LOW (ref 8.9–10.3)
Chloride: 117 mmol/L — ABNORMAL HIGH (ref 101–111)
GFR calc Af Amer: >60 mL/min (ref >60)
Glucose, Bld: 76 mg/dL (ref 65–99)
Potassium: 3.6 mmol/L (ref 3.5–5.1)
SODIUM: 144 mmol/L (ref 135–145)
Total Bilirubin: 0.5 mg/dL (ref 0.3–1.2)
Total Protein: 6.2 g/dL — ABNORMAL LOW (ref 6.5–8.1)

## 2015-06-16 MED ORDER — CIPROFLOXACIN IN D5W 400 MG/200ML IV SOLN: 400.0000 mg | INTRAVENOUS | Status: DC

## 2015-06-16 MED ORDER — NYSTATIN 100000 UNIT/ML MT SUSP
5.0000 mL | Freq: Four times a day (QID) | OROMUCOSAL | Status: DC
  Administered 2015-06-17 – 2015-06-18 (×6): 500000 [IU] via ORAL
  Filled 2015-06-16 (×9): qty 5

## 2015-06-16 MED ORDER — SODIUM CHLORIDE 0.9 % IV SOLN
1.0000 g | Freq: Three times a day (TID) | INTRAVENOUS | Status: DC
  Administered 2015-06-16 – 2015-06-18 (×5): 1 g via INTRAVENOUS
  Filled 2015-06-16 (×10): qty 1

## 2015-06-16 NOTE — Progress Notes (Signed)
Patient ID: Suzanne Ewing, female   DOB: December 24, 1945, 69 y.o.   MRN: [id number] SUBJECTIVE:  Admitted with abdominal pain, acute renal failure, hypotension, sepsis, sacral and heel decubiti. Multiple loose stools. C. Diff negative. Afebrile. Still c/o poor appetite and low energy level.     Past Medical History  Diagnosis Date  . Arthritis   . Anemia   . GERD (gastroesophageal reflux disease)   . Heart disease   . Hypertension   . Nephrolithiasis   . Heel ulcer   . Diabetes mellitus without complication   . Hyperlipidemia   . Coronary artery disease     Past Surgical History  Procedure Laterality Date  . Coronary artery bypass graft    . Back surgery    . Cholecystectomy    . Shoulder surgery    . Knee surgery Left   . Carpal tunnel release Bilateral   . Joint replacement      Scheduled Meds: . aspirin EC  81 mg Oral Daily  . collagenase   Topical Daily  . folic acid  1 mg Oral BID  . heparin  5,000 Units Subcutaneous BID  . hydrocortisone sod succinate (SOLU-CORTEF) inj  50 mg Intravenous Q8H  . insulin aspart  0-9 Units Subcutaneous TID WC  . LORazepam  0.5 mg Oral 3 times per day  . meropenem (MERREM) IV  1 g Intravenous Q8H  . pantoprazole  40 mg Oral Daily  . sodium chloride  3 mL Intravenous Q12H   Continuous Infusions: . sodium chloride 50 mL/hr at 06/16/15 0908   PRN Meds:.acetaminophen **OR** acetaminophen, loperamide, ondansetron **OR** ondansetron (ZOFRAN) IV   EXAM:  BP 134/63 mmHg  Pulse 100  Temp(Src) 98.3 F (36.8 C) (Oral)  Resp 18  Ht 5\' 7"  (1.702 m)  Wt 80 kg (176 lb 5.9 oz)  BMI 27.62 kg/m2  SpO2 97%  General: alert, oriented x 3, NAD HENT: no JVD, no pallor, no icterus,  no cervical lymphadenopathy Chest: clear to auscultation, no rhonchi or crackles. CVS: RRR, S1S2, no tachycardia Abdomen: soft, nontender, no hepatosplenomegaly Ext: mild left hand edema (sec to IV infiltration), trace bilateral pedal edema.  SKIN: left foot and  left buttock dressings intact.   Neuro: moving all extremities.  Labs and imaging studies were reviewed  ASSESSMENT/PLAN:   1. Acute renal failure and dehydration: resolved with IV hydration. Normal Cr and GFR today. 2. E.coli UTI -ESBL resistant to Aztreonam on urine c/s, currently on Meropenem, WBC count is trending down. Vascular consult for PICC line. Blood c/s negative thus far. Chronic diarrhea, h/o IBS: C. Diff negative. Preliminary stool culture: No salmonella or shigella. Flagyl, Vancomycin and Aztreonam were discontinued.  3. Decubitus ulcers- continue woundcare and broad spectrum antibiotic coverage with Meropenem; following blood cultures. 4. 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 Shall continue close blood sugar monitoring with sliding scale coverage.  5. GERD- cont pantoprazole. 6. CAD- resume statin when acute illness improved 7. RA- Leflunomide on hold due to acute illness 8. PT evaluation. Discharge planning.

## 2015-06-16 NOTE — Progress Notes (Signed)
ANTIBIOTIC CONSULT NOTE - Follow Up  Pharmacy Consult for meropenem Indication: Sepsis, UTI, cellulits  Allergies  Allergen Reactions  . Macrobid [Nitrofurantoin Monohyd Macro] Itching, Rash and Swelling    PT HOSPITALIZED  . Penicillins Shortness Of Breath and Rash  . Tape Rash  . Clarithromycin Other (See Comments)    Abdominal pain  . Nitroglycerin Other (See Comments)    Reaction - unknown  . Leflunomide Rash  . Morphine Nausea And Vomiting    Nausea and vomiting  . Naproxen Rash  . Penicillin G Rash    Patient Measurements: Height: 5\' 7"  (170.2 cm) Weight: 176 lb 5.9 oz (80 kg) IBW/kg (Calculated) : 61.6 Adjusted Body Weight: 71.9  Vital Signs: Temp: 98.2 F (36.8 C) (07/27 0531) Temp Source: Oral (07/27 0531) BP: 129/51 mmHg (07/27 0531) Pulse Rate: 100 (07/27 0531) Intake/Output from previous day: 07/26 0701 - 07/27 0700 In: 800 [I.V.:500; IV Piggyback:300] Out: 6387 [Urine:3375] Intake/Output from this shift:    Labs:  Recent Labs  06/14/15 0422 06/16/15 0338  WBC 12.4* 10.0  HGB 8.7* 9.3*  PLT 204 198  CREATININE 1.22* 0.70   Estimated Creatinine Clearance: 72.3 mL/min (by C-G formula based on Cr of 0.7).  Microbiology: Recent Results (from the past 720 hour(s))  Urine culture     Status: None   Collection Time: 06/11/15 10:59 PM  Result Value Ref Range Status   Specimen Description URINE, RANDOM  Final   Special Requests NONE  Final   Culture   Final    >=100,000 COLONIES/mL ESCHERICHIA COLI ESBL-EXTENDED SPECTRUM BETA LACTAMASE-THE ORGANISM IS RESISTANT TO PENICILLINS, CEPHALOSPORINS AND AZTREONAM ACCORDING TO CLSI M100-S15 VOL.[place]. Results Called to: Georgeanna Lea AT 5643 06/14/15 CTJ    Report Status 06/14/2015 FINAL  Final   Organism ID, Bacteria ESCHERICHIA COLI  Final      Susceptibility   Escherichia coli - MIC*    AMPICILLIN >=32 RESISTANT Resistant     CEFTAZIDIME 4 RESISTANT Resistant     CEFAZOLIN >=64 RESISTANT  Resistant     CEFTRIAXONE >=64 RESISTANT Resistant     CIPROFLOXACIN >=4 RESISTANT Resistant     GENTAMICIN <=1 SENSITIVE Sensitive     IMIPENEM <=0.25 SENSITIVE Sensitive     TRIMETH/SULFA >=320 RESISTANT Resistant     Extended ESBL POSITIVE Resistant     NITROFURANTOIN Value in next row Sensitive      SENSITIVE<=16    PIP/TAZO Value in next row Sensitive      SENSITIVE<=4    AMPICILLIN/SULBACTAM Value in next row Sensitive      SENSITIVE8    * >=100,000 COLONIES/mL ESCHERICHIA COLI  Blood Culture (routine x 2)     Status: None (Preliminary result)   Collection Time: 06/12/15 12:23 AM  Result Value Ref Range Status   Specimen Description BLOOD LEFT ARM  Final   Special Requests BOTTLES DRAWN AEROBIC AND ANAEROBIC 2CC  Final   Culture NO GROWTH 3 DAYS  Final   Report Status PENDING  Incomplete  Blood Culture (routine x 2)     Status: None (Preliminary result)   Collection Time: 06/12/15 12:38 AM  Result Value Ref Range Status   Specimen Description BLOOD RIGHT ARM  Final   Special Requests BOTTLES DRAWN AEROBIC AND ANAEROBIC 2CC  Final   Culture NO GROWTH 3 DAYS  Final   Report Status PENDING  Incomplete  C difficile quick scan w PCR reflex ([facility] only)     Status: Abnormal   Collection  Time: 06/13/15 10:58 AM  Result Value Ref Range Status   C Diff antigen POSITIVE (A) NEGATIVE Final   C Diff toxin NEGATIVE NEGATIVE Final   C Diff interpretation   Final    Negative for toxigenic C. difficile. Toxin gene and active toxin production not detected. May be a nontoxigenic strain of C. difficile bacteria present, lacking the ability to produce toxin.  Clostridium Difficile by PCR (not at [facility])     Status: None   Collection Time: 06/13/15 10:58 AM  Result Value Ref Range Status   C difficile by pcr NEGATIVE NEGATIVE Final  C difficile quick scan w PCR reflex ([facility] only)     Status: None   Collection Time: 06/15/15  9:41 AM  Result Value Ref Range Status   C Diff antigen NEGATIVE  NEGATIVE Final   C Diff toxin NEGATIVE NEGATIVE Final   C Diff interpretation Negative for C. difficile  Final    Medical History: Past Medical History  Diagnosis Date  . Arthritis   . Anemia   . GERD (gastroesophageal reflux disease)   . Heart disease   . Hypertension   . Nephrolithiasis   . Heel ulcer   . Diabetes mellitus without complication   . Hyperlipidemia   . Coronary artery disease     Medications:  Scheduled:  . aspirin EC  81 mg Oral Daily  . collagenase   Topical Daily  . folic acid  1 mg Oral BID  . heparin  5,000 Units Subcutaneous BID  . hydrocortisone sod succinate (SOLU-CORTEF) inj  50 mg Intravenous Q8H  . insulin aspart  0-9 Units Subcutaneous TID WC  . LORazepam  0.5 mg Oral 3 times per day  . meropenem (MERREM) IV  1 g Intravenous Q12H  . pantoprazole  40 mg Oral Daily  . sodium chloride  3 mL Intravenous Q12H   Anti-infectives    Start     Dose/Rate Route Frequency Ordered Stop   06/15/15 1215  meropenem (MERREM) 1 g in sodium chloride 0.9 % 100 mL IVPB     1 g 200 mL/hr over 30 Minutes Intravenous Every 12 hours 06/15/15 1204     06/13/15 1730  vancomycin (VANCOCIN) IVPB 1000 mg/200 mL premix  Status:  Discontinued     1,000 mg 200 mL/hr over 60 Minutes Intravenous Every 24 hours 06/13/15 1258 06/15/15 1205   06/13/15 1400  aztreonam (AZACTAM) 1 g in dextrose 5 % 50 mL IVPB  Status:  Discontinued     1 g 100 mL/hr over 30 Minutes Intravenous 3 times per day 06/13/15 1301 06/15/15 1205   06/12/15 1730  vancomycin (VANCOCIN) IVPB 1000 mg/200 mL premix  Status:  Discontinued     1,000 mg 200 mL/hr over 60 Minutes Intravenous Every 36 hours 06/12/15 0433 06/13/15 1258   06/12/15 0900  metroNIDAZOLE (FLAGYL) IVPB 500 mg  Status:  Discontinued     500 mg 100 mL/hr over 60 Minutes Intravenous Every 8 hours 06/12/15 0345 06/15/15 1205   06/12/15 0800  aztreonam (AZACTAM) 500 mg in dextrose 5 % 50 mL IVPB  Status:  Discontinued     500 mg 100 mL/hr  over 30 Minutes Intravenous 3 times per day 06/12/15 0357 06/13/15 1301   06/11/15 2330  aztreonam (AZACTAM) 2 g in dextrose 5 % 50 mL IVPB     2 g 100 mL/hr over 30 Minutes Intravenous  Once 06/11/15 2318 06/12/15 0016   06/11/15 2330  metroNIDAZOLE (FLAGYL) IVPB 500  mg     500 mg 100 mL/hr over 60 Minutes Intravenous  Once 06/11/15 2318 06/12/15 0205   06/11/15 2330  vancomycin (VANCOCIN) IVPB 1000 mg/200 mL premix     1,000 mg 200 mL/hr over 60 Minutes Intravenous  Once 06/11/15 2318 06/12/15 2878     Assessment: 69 yo female with Sepsis, UTI, cellulits. Abx vanc/aztreonam/Flagyl narrowed to meropenem. MD aware of PCN allergy.  UCx ESBL E coli sensitive to imipenem BCx x2 NGTD x3 days Cdiff neg  SCr: 0.7 (0.8), est CrCl~72 mL/min  Plan:  Will transition patient to meropenem 1 gm IV q8h based on improved renal function.   Pharmacy will continue to follow.  Murrell Converse, PharmD Clinical Pharmacist 06/16/2015

## 2015-06-16 NOTE — Progress Notes (Signed)
Patient remained A&O X4, VS WDL for patient, foley in place pericare/foley care provided for patient. Barrier cream applied to patient's buttocks as needed. Patient assisted with repositioned and pillow supported Q2H and as needed. Contact isolation was maintained . Family member at bedside overnight.

## 2015-06-16 NOTE — Progress Notes (Signed)
Was getting ready to call pt for pre-op call and saw that she is in [facility]. I called Dr. Leafy Ro office to notify them and to find out if pt was to be transferred here tomorrow for the surgery. Spoke with Joellen Jersey and she states she will notify Dr. Migdalia Dk.

## 2015-06-16 NOTE — Evaluation (Signed)
Physical Therapy Evaluation Patient Details Name: Suzanne Ewing MRN: [id number] DOB: [birth year] Today's Date: 06/16/2015   History of Present Illness  presented to ER secondary to abdominal pain and inability to void; admitted with sepsis related to UTI (minimally responsive to outpatient treatment).  Clinical Impression  Upon evaluation, patient alert and oriented to basic information; slightly irritable, but cooperative.  Patient globally weak and deconditioned throughout.  Displays very poor activity tolerance and limited tolerance to upright positioning (may attempt to assess orthostatics next date).  Currently requiring mod/max assist for bed mobility and unsupported sitting balance; unable/unsafe to attempt OOB this date.  Vitals stable and WFL throughout evaluation, but patient notably fatigued with increased WOB with minimal activity. Would benefit from skilled PT to address above deficits and promote optimal return to PLOF; recommend transition to STR upon discharge from acute hospitalization.    Follow Up Recommendations SNF    Equipment Recommendations       Recommendations for Other Services       Precautions / Restrictions Precautions Precautions: Fall Precaution Comments: Contact isolation Required Braces or Orthoses:  (L LE offloading shoe with WBing (per patient report))      Mobility  Bed Mobility Overal bed mobility: Needs Assistance Bed Mobility: Supine to Sit;Sit to Supine     Supine to sit: Max assist;Mod assist Sit to supine: Mod assist;Max assist      Transfers                 General transfer comment: unable to complete this date; poor sitting balance and poor activity tolerance (requiring return to supine after 2-3 min of sitting edge of bed)  Ambulation/Gait             General Gait Details: non-ambulatory at baseline  Stairs            Wheelchair Mobility    Modified Rankin (Stroke Patients Only)       Balance  Overall balance assessment: Needs assistance Sitting-balance support: No upper extremity supported;Feet supported Sitting balance-Leahy Scale: Poor Sitting balance - Comments: R lateral lean requiring UE support and mod assist from therapist to maintain upright position.  Very poor activity tolerance, requiring return to supine (due to fatigue) after only 2-3 min of sitting                                     Pertinent Vitals/Pain Pain Assessment: No/denies pain    Home Living Family/patient expects to be discharged to:: Private residence Living Arrangements: Spouse/significant other Available Help at Discharge: Family Type of Home: House Home Access: Ramped entrance     Home Layout: One level        Prior Function Level of Independence: Independent         Comments: Uses power WC/scooter as primary mobility; able to perform basic transfers to/from all seating surfaces via stand/squat pivot transfers without assist. Does endorse at least 1 fall in recent months.     Hand Dominance        Extremity/Trunk Assessment   Upper Extremity Assessment: Generalized weakness (grossly 3-/5, globally weak and deconditioned)           Lower Extremity Assessment: Generalized weakness (grossly at least 2+ to 3-/5, generally weak and deconditioned, tolerating very limited active mobility at this time)      Cervical / Trunk Assessment:  (excessive weight shift to R IT, elongation of  R lateral trunk in unsupported sitting, requiring mod assist from therapist to correct)  Communication   Communication: No difficulties  Cognition Arousal/Alertness: Awake/alert Behavior During Therapy: WFL for tasks assessed/performed (slightly irritable at times) Overall Cognitive Status: Within Functional Limits for tasks assessed                      General Comments General comments (skin integrity, edema, etc.): diabetic ulceration to bilat LEs (L > R), wears offloading  shoe to L LE with all transfers/WBing (present in room and donned throughout session); per chart, decubitus ulceration to sacrum (not visualized during evaluation)    Exercises Other Exercises Other Exercises: Unsupported sitting edge of bed, attempted education and facilitation for improved postural alignment, weight shift and midline orientation.  Patient unable to generate strength necessary for self-correction, requiring mod assist from therapist throughout.  Heavily reliant on bilat UEs for external stabilization.  Dep for LE positioning (with L LE offloading shoe donned)      Assessment/Plan    PT Assessment Patient needs continued PT services  PT Diagnosis Generalized weakness;Difficulty walking   PT Problem List Decreased strength;Decreased range of motion;Decreased activity tolerance;Decreased balance;Decreased mobility;Decreased knowledge of use of DME;Decreased safety awareness;Decreased knowledge of precautions;Cardiopulmonary status limiting activity;Obesity;Decreased skin integrity  PT Treatment Interventions Gait training;Stair training;Functional mobility training;DME instruction;Therapeutic activities;Therapeutic exercise;Balance training;Patient/family education   PT Goals (Current goals can be found in the Care Plan section) Acute Rehab PT Goals Patient Stated Goal: "to get up and move around" PT Goal Formulation: With patient/family Time For Goal Achievement: 06/16/15 Potential to Achieve Goals: Fair    Frequency Min 2X/week   Barriers to discharge Decreased caregiver support      Co-evaluation               End of Session Equipment Utilized During Treatment: Gait belt Activity Tolerance: Patient limited by fatigue Patient left: in bed;with call bell/phone within reach;with family/visitor present Nurse Communication:  (L UE IV leaking, husband request to speak with attending physician)         Time: 1610-9604 PT Time Calculation (min) (ACUTE ONLY): 29  min   Charges:   PT Evaluation $Initial PT Evaluation Tier I: 1 Procedure PT Treatments $Therapeutic Activity: 8-22 mins   PT G Codes:       Devell Parkerson H. Owens Shark, PT, DPT, NCS 06/16/2015, 11:22 AM [phone]

## 2015-06-16 NOTE — Progress Notes (Signed)
Spoke with Dr. Migdalia Dk and she is not aware that pt is an inpatient at [facility] with multiple issues. She states she will be cancelling surgery for tomorrow.

## 2015-06-17 ENCOUNTER — Inpatient Hospital Stay: Payer: Medicare Other

## 2015-06-17 ENCOUNTER — Encounter (HOSPITAL_COMMUNITY): Admission: RE | Payer: Self-pay | Source: Ambulatory Visit

## 2015-06-17 ENCOUNTER — Ambulatory Visit (HOSPITAL_COMMUNITY): Admission: RE | Admit: 2015-06-17 | Payer: Medicare Other | Source: Ambulatory Visit | Admitting: Plastic Surgery

## 2015-06-17 ENCOUNTER — Encounter: Payer: Self-pay | Admitting: Gastroenterology

## 2015-06-17 LAB — GLUCOSE, CAPILLARY
GLUCOSE-CAPILLARY: 170 mg/dL — AB (ref 65–99)
Glucose-Capillary: 79 mg/dL (ref 65–99)
Glucose-Capillary: 60 mg/dL — ABNORMAL LOW (ref 65–99)
Glucose-Capillary: 65 mg/dL (ref 65–99)
Glucose-Capillary: 92 mg/dL (ref 65–99)
Glucose-Capillary: 165 mg/dL — ABNORMAL HIGH (ref 65–99)
Glucose-Capillary: 182 mg/dL — ABNORMAL HIGH (ref 65–99)

## 2015-06-17 LAB — STOOL CULTURE: SPECIAL REQUESTS: NORMAL

## 2015-06-17 LAB — CULTURE, BLOOD (ROUTINE X 2)
Culture: NO GROWTH
Culture: NO GROWTH

## 2015-06-17 LAB — WBCS, STOOL

## 2015-06-17 SURGERY — IRRIGATION AND DEBRIDEMENT WOUND
Anesthesia: General | Laterality: Left

## 2015-06-17 MED ORDER — LOPERAMIDE HCL 2 MG PO CAPS
2.0000 mg | ORAL_CAPSULE | Freq: Four times a day (QID) | ORAL | Status: DC | PRN
  Administered 2015-06-17 – 2015-06-18 (×3): 2 mg via ORAL
  Filled 2015-06-17 (×3): qty 1

## 2015-06-17 MED ORDER — SODIUM CHLORIDE 0.9 % IJ SOLN: 3.0000 mL | INTRAMUSCULAR | Status: DC | PRN

## 2015-06-17 MED ORDER — LOPERAMIDE HCL 2 MG PO CAPS: 2.0000 mg | ORAL_CAPSULE | ORAL | Status: DC | PRN

## 2015-06-17 MED ORDER — INSULIN ASPART 100 UNIT/ML ~~LOC~~ SOLN
0.0000 [IU] | Freq: Three times a day (TID) | SUBCUTANEOUS | Status: DC
  Administered 2015-06-18: 3 [IU] via SUBCUTANEOUS
  Filled 2015-06-17: qty 2
  Filled 2015-06-17: qty 3

## 2015-06-17 NOTE — Progress Notes (Signed)
PICC line placed today by Leigh which took almost 2 hours. Marliss Czar was called and stated she had to go to the next appointment, so was unable to stay for the second chest xray verification. Confirmed with Belenda Cruise at [facility] that patient's PICC line is in place at the cavoaortic junction. Okay to use line.

## 2015-06-17 NOTE — Progress Notes (Signed)
PT Cancellation Note  Patient Details Name: Suzanne Ewing MRN: [id number] DOB: [birth year]   Cancelled Treatment:    Reason Eval/Treat Not Completed: Fatigue/lethargy limiting ability to participate (Treatment session attempted; patient/husband politely declining at this time.  Reports too fatigued at this time (multiple attempts at PICC placement, x-ray, etc).  Will re-attempt next date as medically appropriate and patient agreeable.)   Kyriaki Moder H. Owens Shark, PT, DPT, NCS 06/17/2015, 3:16 PM [phone]

## 2015-06-17 NOTE — Care Management (Signed)
Patient has been accepted by [facility].  Patient could be accepted / transferred 7/29.  Liaison has met with family

## 2015-06-17 NOTE — Progress Notes (Signed)
Patient is alert and oriented. Drowsy between care. Sinus rhythm with BBB on tele. No complaints of pain, other than a burning sensation on buttock from wounds. Dressings have been changed. Difficult to keep dressing clean and dry on anal abcess wound due to patient's incontinence. PICC line placed today, double lumen. IV fluids and antibiotics have been restarted. Blood sugar was low this morning, but is now stabilized. Process has been started to send patient to [facility] upon discharge. Upon talking with the family and patient they are open to going. Will continue to monitor.

## 2015-06-17 NOTE — Progress Notes (Signed)
Upon assessment, patient with redend area surrounding dark questionable wound/ abrasion/ ecchymosis. Family states the size of the area has increased. Area marked at time of shift assessment. Redness, heat and swelling present at site. Hande, on call MD, notified of patient's condition and current action plan. Discussed options, including patient's medication allergies. Final plan MD offered was to notify PCP in the morning, suggested ID consult, but offered no orders for one, and the potential for broader range IV ABx. Will continue to monitor size and condition of area.

## 2015-06-17 NOTE — Progress Notes (Signed)
Patient has no IV access, two Iv insertion made by care nurse and one by the nursing supervisor.Patient is scheduled to have PICC line placed later in AM. IV meropenem (MERREM) and hydrocortisone sodium succinate (SOLU-CORTEF) 100 MG injection on hold per Dr. Marcille Blanco.

## 2015-06-17 NOTE — Progress Notes (Signed)
[facility] has been called for PICC line placement.

## 2015-06-17 NOTE — Care Management (Signed)
Spoke directly with attending regarding a gi consult for uncontrollable diarrhea.  Results of stool studies are still pending.  Patient is going to require a PICC line due to inability to maintain IV access. Attending mentions that patient wishes to return home and the need for continued IV antibiotic therapy of meropenem q 12h for approx 10 days.  Spoke with patient in the presence of her sister and attending.  She has multiple pressure wound complicated by exposure.  to diarrhea stool that has has been ongoing since early July.  Patient has not followed up with gi for 2 years.  Attending will order gi consult. Discharge directly home would not be in the best interest of the patient as she is not even able to sit up on the side of the bed. Discharge directly home would not be in the best interest of the patient as she is not even able to sit up on the side of the bed. At present, she is total care.  With permission from the patient and her sister, patient will be assessed for ltac criteria by [facility] and [facility].  It was mentioned by sister of transferring to a tertiary facility.  Discussed that medicare would not cover unless patient was to receive treatment that could not be provided at [facility]

## 2015-06-17 NOTE — Progress Notes (Signed)
Dr. Candiss Norse notified that patient's blood sugar has dropped some today. MD stated to change sliding scale to not treat blood sugars until greater than 150. SSI has been changed. MD stated to encourage PO intake and is discontinuing steroid. Will continue to assess.

## 2015-06-17 NOTE — Consult Note (Signed)
GI Inpatient Consult Note  Reason for Consult: Diarrhea   Attending Requesting Consult: Dr. Candiss Norse  History of Present Illness: Suzanne Ewing is a 69 y.o. female who reports that she has had diarrhea every day for many years.  She was diagnosed with irritable bowel with diarrhea by Dr. Vira Agar in 2005.  She reports she uses Imodium and Pepto-Bismol on a daily basis.  She reports that she will have 3 or 4 diarrhea episodes almost every evening.  Her husband was with her and did provide some history.  He reports that every time she takes antibiotics, "it hurts her GI system" and causes her to have nausea, vomiting, and diarrhea.  He reports that she has not wanted to eat much recently.  The patient reports that she had nausea and vomiting for couple of days when she started the antibiotics, but feels it is controlled at this time.  She endorses she is still having diarrhea.  Her last bowel movement was  At approximately 10:00 a.m. today, she felt as if she had to have another bowel movement at the end of our visit, which was about 2:30 this afternoon.  She denies any abdominal pain.  She denies seeing any blood on the tissue, in the stool, or in the toilet.  She reports that she is compliant with omeprazole and takes a twice daily and that controls her heartburn and acid reflux well.  She was last seen at [facility] in the GI department in March, 2015. At that time she still is experience diarrhea however she was controlling it to just a few episodes a day with Imodium.  She did have a colonoscopy and upper endoscopy completed at that time.  Colonoscopy, February 06, 2014. Dr. Vira Agar.  Indications; screening for colorectal malignant neoplasm.  Findings; internal hemorrhoids were found and they were small.  The exam was otherwise without abnormality.  Due to history of diarrhea we biopsied the colon in the sigmoid and descending colon.  There was a significant amount of frequent contract shins  which could also indicate irritable bowel as a source of diarrhea.  Pathology ; antral mucosa with moderate foveolar hyperplasia and minimal chronic gastritis.  Oxyntic mucosa with minimal chronic gastritis and increased and parietal cell size with topical protrusions.  Negative for H pylori, dysplasia and malignancy.  Repeat colonoscopy due in March, 2025.  Upper endoscopy, February 06, 2014. Dr. Vira Agar.  Indications; heartburn.  Findings ; normal  Esophagus.  Biopsied.  Gastritis.  Biopsied.  Normal examined duodenum.  Pathology ; squamocolumnar mucosa with changes consistent with GERD.  No intestinal metaplasia, dysplasia or malignancy identified.  Reviewing her records from [place] [facility], it does appear that her diabetes is uncontrolled, her last A1c was 8.2, that was 2 months ago.  Past Medical History:  Past Medical History  Diagnosis Date  . Arthritis   . Anemia   . GERD (gastroesophageal reflux disease)   . Heart disease   . Hypertension   . Nephrolithiasis   . Heel ulcer   . Diabetes mellitus without complication   . Hyperlipidemia   . Coronary artery disease     Problem List: Patient Active Problem List   Diagnosis Date Noted  . Sepsis secondary to UTI 06/12/2015  . Cellulitis and abscess 06/12/2015  . Acute renal failure 06/12/2015  . Hyperkalemia 06/12/2015  . Anemia 06/12/2015  . CAD (coronary artery disease) 06/12/2015  . DM (diabetes mellitus) 06/12/2015  . HTN (hypertension) 06/12/2015  . Rheumatoid arthritis  06/12/2015  . GERD (gastroesophageal reflux disease) 06/12/2015  . DM2 (diabetes mellitus, type 2) 06/12/2015    Past Surgical History: Past Surgical History  Procedure Laterality Date  . Coronary artery bypass graft    . Back surgery    . Cholecystectomy    . Shoulder surgery    . Knee surgery Left   . Carpal tunnel release Bilateral   . Joint replacement      Allergies: Allergies  Allergen Reactions  . Macrobid [Nitrofurantoin Monohyd Macro]  Itching, Rash and Swelling    PT HOSPITALIZED  . Penicillin G Shortness Of Breath and Rash  . Penicillins Shortness Of Breath and Rash  . Tape Rash  . Clarithromycin Other (See Comments)    Abdominal pain  . Nitroglycerin Other (See Comments)    Reaction - unknown  . Leflunomide Rash  . Morphine Nausea And Vomiting    Nausea and vomiting  . Naproxen Rash    Home Medications: Prescriptions prior to admission  Medication Sig Dispense Refill Last Dose  . acetaminophen (TYLENOL) 500 MG tablet Take 1,000 mg by mouth every 8 (eight) hours as needed.   Past Week at Unknown time  . ascorbic acid (VITAMIN C) 1000 MG tablet Take 1,000 mg by mouth daily.   06/12/2015 at Unknown time  . Calcium-Vitamin D (CALTRATE 600 PLUS-VIT D PO) Take by mouth.   06/12/2015 at Unknown time  . CARTIA XT 120 MG 24 hr capsule Take 1 tablet by mouth daily.   06/12/2015 at Unknown time  . Cranberry 500 MG CAPS Take 500 mg by mouth daily.   06/12/2015 at Unknown time  . cyanocobalamin (,VITAMIN B-12,) 1000 MCG/ML injection Inject 1,000 mcg into the muscle every 30 (thirty) days.   Past Month at Unknown time  . diltiazem (CARDIZEM) 120 MG tablet Take 120 mg by mouth daily.   Taking  . diphenhydrAMINE (SOMINEX) 25 MG tablet Take 25 mg by mouth at bedtime as needed for sleep.   06/11/2015 at Unknown time  . etodolac (LODINE) 500 MG tablet Take 500 mg by mouth 2 (two) times daily.   06/11/2015 at Unknown time  . ferrous sulfate 325 (65 FE) MG tablet Take 325 mg by mouth daily with breakfast.   06/12/2015 at Unknown time  . folic acid (FOLVITE) 1 MG tablet Take 1 mg by mouth 2 (two) times daily.   06/12/2015 at Unknown time  . furosemide (LASIX) 40 MG tablet Take 40 mg by mouth.   06/12/2015 at Unknown time  . glimepiride (AMARYL) 4 MG tablet Take 4 mg by mouth daily with breakfast. Take 1.5 tablets (6 mg total) by mouth with breakfast  As listed on medication list from [facility]   06/12/2015 at Unknown time  . glucose  blood test strip 1 each by Other route 3 (three) times daily. Use as instructed   06/12/2015 at Unknown time  . HUMALOG 100 UNIT/ML injection Inject 4 units at breakfast, 4 units at lunch and 12 units at supper   06/12/2015 at Unknown time  . HUMULIN N 100 UNIT/ML injection Inject 26 Units into the skin daily.   06/12/2015 at Unknown time  . leflunomide (ARAVA) 20 MG tablet Take 20 mg by mouth daily.   06/12/2015 at Unknown time  . levocetirizine (XYZAL) 5 MG tablet Take 5 mg by mouth every evening.   06/12/2015 at Unknown time  . lidocaine (LIDODERM) 5 % Place 1 patch onto the skin.   prn at prn  .  lisinopril (PRINIVIL,ZESTRIL) 20 MG tablet Take 20 mg by mouth daily.   06/12/2015 at Unknown time  . loperamide (IMODIUM) 2 MG capsule Take 4 mg by mouth as needed for diarrhea or loose stools.   Past Month at Unknown time  . LORazepam (ATIVAN) 0.5 MG tablet Take 0.5 mg by mouth every 8 (eight) hours. Take 1/2 to 1(one) tablet by mouth once daily as needed for anxiety per medication list   06/12/2015 at Unknown time  . Multiple Vitamins-Minerals (CENTRUM SILVER PO) Take by mouth.   06/12/2015 at Unknown time  . Omega-3 Fatty Acids (FISH OIL) 1000 MG CAPS Take by mouth.   06/12/2015 at Unknown time  . omeprazole (PRILOSEC) 20 MG capsule Take 20 mg by mouth daily.   Past Week at Unknown time  . potassium chloride (KLOR-CON) 20 MEQ packet Take 20 mEq by mouth 2 (two) times daily.   06/12/2015 at Unknown time  . Potassium Gluconate ([employer] POTASSIUM GLUCONATE) 2 MEQ TABS Take 1 tablet by mouth 2 (two) times daily.    06/12/2015 at Unknown time  . predniSONE (DELTASONE) 1 MG tablet Take 1 mg by mouth daily with breakfast.    Past Week at Unknown time  . risedronate (ACTONEL) 35 MG tablet Take 35 mg by mouth every 7 (seven) days.    Past Week at Unknown time  . SANTYL ointment Apply 1 application topically daily as needed.   Past Month at Unknown time  . sulfamethoxazole-trimethoprim (BACTRIM DS,SEPTRA DS) 800-160 MG per  tablet Take 1 tablet by mouth 2 (two) times daily.   06/12/2015 at Unknown time  . traMADol (ULTRAM) 50 MG tablet Take 50 mg by mouth every 6 (six) hours as needed.    06/12/2015 at Unknown time  . predniSONE (DELTASONE) 1 MG tablet    Taking  . vitamin A 10000 UNIT capsule Take 10,000 Units by mouth daily.    Taking   Home medication reconciliation was completed with the patient.   Scheduled Inpatient Medications:   . aspirin EC  81 mg Oral Daily  . collagenase   Topical Daily  . folic acid  1 mg Oral BID  . heparin  5,000 Units Subcutaneous BID  . insulin aspart  0-9 Units Subcutaneous TID WC  . LORazepam  0.5 mg Oral 3 times per day  . meropenem (MERREM) IV  1 g Intravenous Q8H  . nystatin  5 mL Oral QID  . pantoprazole  40 mg Oral Daily  . sodium chloride  3 mL Intravenous Q12H    Continuous Inpatient Infusions:   . sodium chloride 50 mL/hr at 06/16/15 1900    PRN Inpatient Medications:  acetaminophen **OR** acetaminophen, loperamide, ondansetron **OR** ondansetron (ZOFRAN) IV  Family History: family history includes Diabetes type II in her sister; Hypertension in her sister; Stroke in her mother.    Social History:   reports that she quit smoking about a year ago. She has never used smokeless tobacco. She reports that she drinks alcohol. She reports that she does not use illicit drugs.   Review of Systems: Constitutional: Weight is stable.  Eyes: No changes in vision. ENT: No oral lesions, sore throat.  GI: see HPI.  Heme/Lymph: No easy bruising.  CV: No chest pain.  GU: No hematuria.  Integumentary: No rashes.  Neuro: No headaches.  Psych: No depression/anxiety.  Endocrine: No heat/cold intolerance.  Allergic/Immunologic: No urticaria.  Resp: No cough, SOB.  Musculoskeletal: No joint swelling.    Physical Examination: BP 143/64  mmHg  Pulse 96  Temp(Src) 97.9 F (36.6 C) (Oral)  Resp 18  Ht 5\' 7"  (1.702 m)  Wt 77.384 kg (170 lb 9.6 oz)  BMI 26.71 kg/m2   SpO2 98% Gen: NAD, alert and oriented x 4 HEENT: PEERLA, EOMI, Neck: supple, no JVD or thyromegaly Chest: CTA bilaterally, no wheezes, crackles, or other adventitious sounds CV: RRR, no m/g/c/r Abd: soft, NT, ND, +BS in all four quadrants; no HSM, guarding, ridigity, or rebound tenderness  Data: Lab Results  Component Value Date   WBC 10.0 06/16/2015   HGB 9.3* 06/16/2015   HCT 30.5* 06/16/2015   MCV 85.0 06/16/2015   PLT 198 06/16/2015    Recent Labs Lab 06/13/15 0416 06/14/15 0422 06/16/15 0338  HGB 8.6* 8.7* 9.3*   Lab Results  Component Value Date   NA 144 06/16/2015   K 3.6 06/16/2015   CL 117* 06/16/2015   CO2 20* 06/16/2015   BUN 32* 06/16/2015   CREATININE 0.70 06/16/2015   Lab Results  Component Value Date   ALT 15 06/16/2015   AST 18 06/16/2015   ALKPHOS 114 06/16/2015   BILITOT 0.5 06/16/2015   No results for input(s): APTT, INR, PTT in the last 168 hours. Assessment/Plan: Ms. Constantine is a 69 y.o. female with chronic diarrhea  Recommendations:   Patient has a long history of  IBS / chronic diarrhea.  She has been seen by Dr. Vira Agar in the past  for this.  We agree with PPI coverage.  We recommend that the patient be given to Imodium every 6 hours as needed to help control her bowels. We do not see any reason for colonoscopy at this time, especially with the sacral wound.  We also strongly recommend that the patient follow up at [facility] clinic in the GI department, after this acute situation is resolved so we can better help manage her IBS symptoms. We will continue to follow with you. Thank you for the consult. Please call with questions or concerns.  Artem Bunte S Richards,PA-C  I personally performed these services.

## 2015-06-17 NOTE — Consult Note (Signed)
  Pt seen and examined. Please see C. Celesta Aver' notes. Pt of Dr. Hervey Ard. Jerelene Redden, NP. Diarrhea all her life. Nl colon in 2015. Diarrhea after each meal. Stool tests are neg. Has IBS. Normally takes upto 6 imodiums per day as well as peptobismol. Not taking enough imodiums  Here in hospital. Can take upto 8 imodiums per day. Will see if diarrhea is controlled. Continue this regimen when discharged to rehab. Have pt f/u with K.Mills post discharge. Thanks.

## 2015-06-17 NOTE — Progress Notes (Signed)
Patient ID: Suzanne Ewing, female   DOB: 05/12/1946, 69 y.o.   MRN: [id number] SUBJECTIVE:  Admitted with abdominal pain, acute renal failure, hypotension, sepsis, sacral and heel decubiti. Multiple loose stools, worse today. C. Diff negative. Prelim stool c/s negative. Afebrile. Feels weak. Poor oral intake. Sister at bedside today.   Past Medical History  Diagnosis Date  . Arthritis   . Anemia   . GERD (gastroesophageal reflux disease)   . Heart disease   . Hypertension   . Nephrolithiasis   . Heel ulcer   . Diabetes mellitus without complication   . Hyperlipidemia   . Coronary artery disease     Past Surgical History  Procedure Laterality Date  . Coronary artery bypass graft    . Back surgery    . Cholecystectomy    . Shoulder surgery    . Knee surgery Left   . Carpal tunnel release Bilateral   . Joint replacement      Scheduled Meds: . aspirin EC  81 mg Oral Daily  . collagenase   Topical Daily  . folic acid  1 mg Oral BID  . heparin  5,000 Units Subcutaneous BID  . hydrocortisone sod succinate (SOLU-CORTEF) inj  50 mg Intravenous Q8H  . insulin aspart  0-9 Units Subcutaneous TID WC  . LORazepam  0.5 mg Oral 3 times per day  . meropenem (MERREM) IV  1 g Intravenous Q8H  . nystatin  5 mL Oral QID  . pantoprazole  40 mg Oral Daily  . sodium chloride  3 mL Intravenous Q12H   Continuous Infusions: . sodium chloride 50 mL/hr at 06/16/15 1900   PRN Meds:.acetaminophen **OR** acetaminophen, loperamide, ondansetron **OR** ondansetron (ZOFRAN) IV   EXAM:  BP 124/66 mmHg  Pulse 91  Temp(Src) 97.9 F (36.6 C) (Oral)  Resp 16  Ht 5\' 7"  (1.702 m)  Wt 77.384 kg (170 lb 9.6 oz)  BMI 26.71 kg/m2  SpO2 96%  General: alert, oriented x 3, NAD HENT: no JVD, no pallor, no icterus,  no cervical lymphadenopathy Chest: clear to auscultation, no rhonchi or crackles. CVS: RRR, S1S2, no tachycardia Abdomen: soft, nontender, no hepatosplenomegaly Ext: trace bilateral pedal  edema.  SKIN: left foot dressing intact. Left buttock increased area of erythema and induration, decub dressing in place.   Neuro: moving all extremities.  Labs and imaging studies were reviewed  ASSESSMENT/PLAN:   1. Acute renal failure and dehydration: resolved with IV hydration. Normal Cr and GFR. 2. E.coli UTI -ESBL resistant to Aztreonam on urine c/s, currently on Meropenem, WBC count is trending down. PICC line placement today for IV antibiotic therapy. Blood c/s negative thus far. Acute worsening of chronic diarrhea, h/o IBS, imodium and Zofran prn: C. Diff negative. Preliminary stool culture: No salmonella or shigella. GI consult requested.   3. Decubitus ulcers- continue woundcare and broad spectrum antibiotic coverage with Meropenem; following blood cultures, obtain wound culture. 4. 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 Shall continue close blood sugar monitoring with sliding scale coverage.  5. GERD- cont pantoprazole. 6. CAD- resume statin when acute illness improved 7. RA- Leflunomide on hold due to acute illness 8. Continue Physical therapy. Care management is following.

## 2015-06-17 NOTE — Clinical Social Work Note (Signed)
RNCM talked to the family about the possibility of going to LTAC.  Liaison from [facility] also spoke to the family.  CSW attempted to meet with pt to discuss the option of SNF but pt was resting.  CSW will meet with pt at a later time.  [place], [place]

## 2015-06-18 ENCOUNTER — Ambulatory Visit (HOSPITAL_COMMUNITY)
Admission: AD | Admit: 2015-06-18 | Discharge: 2015-06-18 | Disposition: A | Discharge status: Home / Self Care | Payer: Medicare Other | Source: Other Acute Inpatient Hospital | Attending: Internal Medicine | Admitting: Internal Medicine

## 2015-06-18 DIAGNOSIS — A419 Sepsis, unspecified organism: Secondary | ICD-10-CM | POA: Insufficient documentation

## 2015-06-18 LAB — GLUCOSE, CAPILLARY
GLUCOSE-CAPILLARY: 227 mg/dL — AB (ref 65–99)
Glucose-Capillary: 102 mg/dL — ABNORMAL HIGH (ref 65–99)

## 2015-06-18 MED ORDER — ACETAMINOPHEN 325 MG PO TABS: 650.0000 mg | ORAL_TABLET | Freq: Four times a day (QID) | ORAL | Status: AC | PRN

## 2015-06-18 MED ORDER — ONDANSETRON HCL 4 MG/2ML IJ SOLN: 4.0000 mg | Freq: Four times a day (QID) | INTRAMUSCULAR | Status: DC | PRN

## 2015-06-18 MED ORDER — LOPERAMIDE HCL 2 MG PO CAPS: 2.0000 mg | ORAL_CAPSULE | Freq: Four times a day (QID) | ORAL | Status: AC | PRN

## 2015-06-18 MED ORDER — PANTOPRAZOLE SODIUM 40 MG PO TBEC
40.0000 mg | DELAYED_RELEASE_TABLET | Freq: Two times a day (BID) | ORAL | Status: DC
  Administered 2015-06-18: 40 mg via ORAL
  Filled 2015-06-18: qty 1

## 2015-06-18 MED ORDER — NYSTATIN 100000 UNIT/ML MT SUSP: 5.0000 mL | Freq: Four times a day (QID) | OROMUCOSAL | Status: DC

## 2015-06-18 MED ORDER — INSULIN ASPART 100 UNIT/ML ~~LOC~~ SOLN: 0.0000 [IU] | Freq: Three times a day (TID) | SUBCUTANEOUS | Status: DC

## 2015-06-18 MED ORDER — SODIUM CHLORIDE 0.9 % IV SOLN: 1.0000 g | Freq: Three times a day (TID) | INTRAVENOUS | Status: AC

## 2015-06-18 MED ORDER — BISMUTH SUBSALICYLATE 262 MG/15ML PO SUSP
30.0000 mL | ORAL | Status: DC | PRN
  Filled 2015-06-18: qty 118

## 2015-06-18 MED ORDER — COLLAGENASE 250 UNIT/GM EX OINT: TOPICAL_OINTMENT | Freq: Every day | CUTANEOUS | Status: DC

## 2015-06-18 MED ORDER — POTASSIUM CHLORIDE CRYS ER 10 MEQ PO TBCR: 10.0000 meq | EXTENDED_RELEASE_TABLET | Freq: Every day | ORAL | Status: AC

## 2015-06-18 MED ORDER — ASPIRIN 81 MG PO TBEC: 81.0000 mg | DELAYED_RELEASE_TABLET | Freq: Every day | ORAL | Status: AC

## 2015-06-18 MED ORDER — BISMUTH SUBSALICYLATE 262 MG/15ML PO SUSP: 30.0000 mL | ORAL | Status: AC | PRN

## 2015-06-18 NOTE — Discharge Summary (Signed)
Physician Discharge Summary  Patient ID: Suzanne Ewing MRN: [id number] DOB/AGE: 1945-12-21 69 y.o.  Admit date: 06/11/2015 Discharge date: 06/18/2015  Admission Diagnoses: E. coli UTI Acute renal failure Hyperkalemia Left buttock and left foot decubiti Type 2 diabetes IBS HTN (hypertension) Rheumatoid arthritis GERD (gastroesophageal reflux disease)  Discharge Diagnoses:  Principal Problem:   E.coli UTI Active Problems:   Cellulitis of left buttock   Left buttock and left foot decubiti   Anemia   CAD (coronary artery disease)   HTN (hypertension)   Rheumatoid arthritis   GERD (gastroesophageal reflux disease)   DM2 (diabetes mellitus, type 2)   Discharged Condition:stable  Hospital Course: 69 year old lady with diabetes, hypertension, rheumatoid arthritis and IBS presented with abdominal pain and inability to pass urine. Initial work up revealed acute renal failure, hyperkalemia, hypotension, sepsis, UTI, sacral and heel decubiti. Patient was started on IV fluids and broad spectrum antibiotics. Renal function gradually improved with hydration. Urine culture revealed E.coli, + ESBL positive. IV Meropenem was started and all other antibiotics were discontinued. She received wound care for decubiti on her left buttock and left foot. Patient had multiple loose stools without abdominal pain or vomiting. C. Difficile and stool culture was negative. Chronic diarrhea and h/o IBS, controlled with imodium QID and Pepto-bismol as needed. Patient was placed on her usual dose of Omeprazole 20 mg BID. Pantoprazole (preferred PPI on in-patient formulary) was discontinued. She was evaluated by GI. She has a colonoscopy in 2015. No additional work up at this time. WBC count decreased to within normal limits. Patient remained afebrile. She was evaluated by physical therapy. Patient remains weak with poor oral intake. PICC line was placed. Plan is to continue IV Meropenem for ten days. Shall discharge  to [facility] for PT, woundcare and IV antibiotic therapy.   Consults: GI and nephrology  Discharge Exam: Blood pressure 128/59, pulse 101, temperature 98.4 F (36.9 C), temperature source Oral, resp. rate 18, height 5\' 6"  (1.676 m), weight 82.555 kg (182 lb), SpO2 100 %.  General: alert, oriented x 3, NAD HENT: no JVD, no pallor, no icterus, no cervical lymphadenopathy Chest: clear to auscultation, no rhonchi or crackles. CVS: RRR, S1S2, no tachycardia Abdomen: soft, nontender, no hepatosplenomegaly Ext: trace bilateral pedal edema.  SKIN: left foot and left buttock dressings intact.  Neuro: moving all extremities.  Disposition: [facility]     Medication List    STOP taking these medications        [employer] POTASSIUM GLUCONATE 2 MEQ Tabs  Generic drug:  Potassium Gluconate     diltiazem 120 MG tablet  Commonly known as:  CARDIZEM     etodolac 500 MG tablet  Commonly known as:  LODINE     glimepiride 4 MG tablet  Commonly known as:  AMARYL     HUMALOG 100 UNIT/ML injection  Generic drug:  insulin lispro     HUMULIN N 100 UNIT/ML injection  Generic drug:  insulin NPH Human     leflunomide 20 MG tablet  Commonly known as:  ARAVA     potassium chloride 20 MEQ packet  Commonly known as:  KLOR-CON     sulfamethoxazole-trimethoprim 800-160 MG per tablet  Commonly known as:  BACTRIM DS,SEPTRA DS      TAKE these medications        acetaminophen 325 MG tablet  Commonly known as:  TYLENOL  Take 2 tablets (650 mg total) by mouth every 6 (six) hours as needed for mild pain (or Fever >/=  101).     ascorbic acid 1000 MG tablet  Commonly known as:  VITAMIN C  Take 1,000 mg by mouth daily.     aspirin 81 MG EC tablet  Take 1 tablet (81 mg total) by mouth daily.     bismuth subsalicylate 376 EG/31DV suspension  Commonly known as:  PEPTO BISMOL  Take 30 mLs by mouth every 4 (four) hours as needed for diarrhea or loose stools.     CALTRATE 600 PLUS-VIT D PO  Take by  mouth.     CARTIA XT 120 MG 24 hr capsule  Generic drug:  diltiazem  Take 1 tablet by mouth daily.     CENTRUM SILVER PO  Take by mouth.     collagenase ointment  Commonly known as:  SANTYL  Apply topically daily.     Cranberry 500 MG Caps  Take 500 mg by mouth daily.     cyanocobalamin 1000 MCG/ML injection  Commonly known as:  (VITAMIN B-12)  Inject 1,000 mcg into the muscle every 30 (thirty) days.     diphenhydrAMINE 25 MG tablet  Commonly known as:  SOMINEX  Take 25 mg by mouth at bedtime as needed for sleep.     ferrous sulfate 325 (65 FE) MG tablet  Take 325 mg by mouth daily with breakfast.     Fish Oil 1000 MG Caps  Take by mouth.     folic acid 1 MG tablet  Commonly known as:  FOLVITE  Take 1 mg by mouth 2 (two) times daily.     glucose blood test strip  1 each by Other route 3 (three) times daily. Use as instructed     insulin aspart 100 UNIT/ML injection  Commonly known as:  novoLOG  Inject 0-9 Units into the skin 3 (three) times daily with meals.     levocetirizine 5 MG tablet  Commonly known as:  XYZAL  Take 5 mg by mouth every evening.     LIDODERM 5 %  Generic drug:  lidocaine  Place 1 patch onto the skin.     lisinopril 20 MG tablet  Commonly known as:  PRINIVIL,ZESTRIL  Take 20 mg by mouth daily.     loperamide 2 MG capsule  Commonly known as:  IMODIUM  Take 1 capsule (2 mg total) by mouth every 6 (six) hours as needed for diarrhea or loose stools.     LORazepam 0.5 MG tablet  Commonly known as:  ATIVAN  Take 0.5 mg by mouth every 8 (eight) hours. Take 1/2 to 1(one) tablet by mouth once daily as needed for anxiety per medication list     meropenem 1 g in sodium chloride 0.9 % 100 mL  Inject 1 g into the vein every 8 (eight) hours.     nystatin 100000 UNIT/ML suspension  Commonly known as:  MYCOSTATIN  Take 5 mLs (500,000 Units total) by mouth 4 (four) times daily.     omeprazole 20 MG capsule  Commonly known as:  PRILOSEC  Take 20  mg by mouth daily.     ondansetron 4 MG/2ML Soln injection  Commonly known as:  ZOFRAN  Inject 2 mLs (4 mg total) into the vein every 6 (six) hours as needed for nausea.     predniSONE 1 MG tablet  Commonly known as:  DELTASONE  Take 1 mg by mouth daily with breakfast.     risedronate 35 MG tablet  Commonly known as:  ACTONEL  Take 35 mg by mouth  every 7 (seven) days.     traMADol 50 MG tablet  Commonly known as:  ULTRAM  Take 50 mg by mouth every 6 (six) hours as needed.     vitamin A 10000 UNIT capsule  Take 10,000 Units by mouth daily.      ASK your doctor about these medications        furosemide 40 MG tablet  Commonly known as:  LASIX  Take 40 mg by mouth.       Follow-up Information    Follow up with Donna.   Specialty:  Cardiac Rehabilitation   Contact information:   [place] [phone] [place] [phone]      Follow up with [facility].   Contact information:   [place]      Signed: Glendon Axe 06/18/2015, 1:25 PM

## 2015-06-18 NOTE — Consult Note (Signed)
  GI Inpatient Follow-up Note  Patient Identification: Suzanne Ewing is a 69 y.o. female  Subjective: Diarrhea better, according to family. 2 BM's so far today. To go to rehab at 2 PM today. Scheduled Inpatient Medications:  . aspirin EC  81 mg Oral Daily  . collagenase   Topical Daily  . folic acid  1 mg Oral BID  . heparin  5,000 Units Subcutaneous BID  . insulin aspart  0-9 Units Subcutaneous TID WC  . LORazepam  0.5 mg Oral 3 times per day  . meropenem (MERREM) IV  1 g Intravenous Q8H  . nystatin  5 mL Oral QID  . pantoprazole  40 mg Oral BID    Continuous Inpatient Infusions:   . sodium chloride 50 mL/hr at 06/17/15 1548    PRN Inpatient Medications:  acetaminophen **OR** acetaminophen, bismuth subsalicylate, loperamide, ondansetron **OR** ondansetron (ZOFRAN) IV, sodium chloride  Review of Systems: Constitutional: Weight is stable.  Eyes: No changes in vision. ENT: No oral lesions, sore throat.  GI: see HPI.  Heme/Lymph: No easy bruising.  CV: No chest pain.  GU: No hematuria.  Integumentary: No rashes.  Neuro: No headaches.  Psych: No depression/anxiety.  Endocrine: No heat/cold intolerance.  Allergic/Immunologic: No urticaria.  Resp: No cough, SOB.  Musculoskeletal: No joint swelling.    Physical Examination: BP 128/59 mmHg  Pulse 101  Temp(Src) 98.4 F (36.9 C) (Oral)  Resp 18  Ht 5\' 6"  (1.676 m)  Wt 82.555 kg (182 lb)  BMI 29.39 kg/m2  SpO2 100% Gen: NAD, alert and oriented x 4 HEENT: PEERLA, EOMI, Neck: supple, no JVD or thyromegaly Chest: CTA bilaterally, no wheezes, crackles, or other adventitious sounds CV: RRR, no m/g/c/r Abd: soft, NT, ND, +BS in all four quadrants; no HSM, guarding, ridigity, or rebound tenderness Ext: no edema, well perfused with 2+ pulses, Skin: no rash or lesions noted Lymph: no LAD  Data: Lab Results  Component Value Date   WBC 10.0 06/16/2015   HGB 9.3* 06/16/2015   HCT 30.5* 06/16/2015   MCV 85.0 06/16/2015    PLT 198 06/16/2015    Recent Labs Lab 06/13/15 0416 06/14/15 0422 06/16/15 0338  HGB 8.6* 8.7* 9.3*   Lab Results  Component Value Date   NA 144 06/16/2015   K 3.6 06/16/2015   CL 117* 06/16/2015   CO2 20* 06/16/2015   BUN 32* 06/16/2015   CREATININE 0.70 06/16/2015   Lab Results  Component Value Date   ALT 15 06/16/2015   AST 18 06/16/2015   ALKPHOS 114 06/16/2015   BILITOT 0.5 06/16/2015   No results for input(s): APTT, INR, PTT in the last 168 hours. Assessment/Plan: Ms. Yoshida is a 69 y.o. female with chronic diarrhea. Stool studies neg. Probably IBS related.   Recommendations: Continue imodium 2 qid prn at rehab. Have pt f/u with Dr. Vira Agar later as outpt. Will sign off. Thanks. Please call with questions or concerns.  Kandee Escalante, Lupita Dawn, MD

## 2015-06-18 NOTE — Progress Notes (Signed)
Patient is discharge to snf in a stable condition, report given to both floor nurse and Care link . Left by carelink accompany by husband and family, dsg to left buttock and foot dry and intact on transport .

## 2015-06-18 NOTE — Care Management Important Message (Signed)
Important Message  Patient Details  Name: Suzanne Ewing MRN: [id number] Date of Birth: [birth year]   Medicare Important Message Given:  Yes-second notification given    Katrina Stack, RN 06/18/2015, 9:36 AM

## 2015-06-18 NOTE — Consult Note (Signed)
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  Stage II pressure ulcer to buttocks and unstageable pressure ulcer to left buttocks, near gluteal fold.  Wound type:Pressure and neuropathic ulcer Drainage (amount, consistency, odor) Moderate serosanguinous drainage.  Wounds are unchanged.  Discharge to [facility] today.  Dressing procedure/placement/frequency: Cleanse left foot ulcer with NS and pat gently dry. Gently fill opening with Iodoform packing strip (and linear wound base as well). Cover with 4x4 and kerlix. Secure with tape. Change daily.   Cleanse buttock lesions with NS and pat gently dry. Apply Santyl to full thickness wound (Covered in yellow slough). 1/8 inch thickness (opaque). Gently fill wound depth with NS moist gauze and cover with ABD pad. Change daily. Barrier cream to all other buttock lesions.  Patient is on LALM. Will not follow at this time.  Please re-consult if needed.  Domenic Moras RN BSN [place] Pager [phone]

## 2015-06-18 NOTE — Care Management (Signed)
Patient/Family have agreed for transfer to [facility] this day.  Accepted by Dr Mal Amabile and going to room 313.  Patient's husband inquires about a private room.  Patient is current assigned an empty room that can house up to three patients.  There are discharges scheduled for today and patient will be assigned a private room.  Informed admission coordinator- Mervin Kung of the active esbl in the urine.  Will arrange for carelink to transport. Informed attending of need to proceed with transfer as soon as possible- DC order, discharge summary and emtala form.  Provided primary nurse with phone number in which to call report

## 2015-06-18 NOTE — Progress Notes (Signed)
ANTIBIOTIC CONSULT NOTE - Follow Up  Pharmacy Consult for meropenem Indication: Ewing, Suzanne Ewing, Suzanne Ewing  Allergies  Allergen Reactions  . Macrobid [Nitrofurantoin Monohyd Macro] Itching, Rash and Swelling    PT HOSPITALIZED  . Penicillin G Shortness Of Breath and Rash  . Penicillins Shortness Of Breath and Rash  . Tape Rash  . Clarithromycin Other (See Comments)    Abdominal pain  . Nitroglycerin Other (See Comments)    Reaction - unknown  . Leflunomide Rash  . Morphine Nausea And Vomiting    Nausea and vomiting  . Naproxen Rash    Patient Measurements: Height: 5\' 6"  (167.6 cm) Weight: 182 lb (82.555 kg) IBW/kg (Calculated) : 59.3 Adjusted Body Weight: 71.9  Vital Signs: Temp: 98 F (36.7 C) (07/29 0519) Temp Source: Oral (07/29 0519) BP: 158/57 mmHg (07/29 0519) Pulse Rate: 95 (07/29 0519) Intake/Output from previous day: 07/28 0701 - 07/29 0700 In: -  Out: 1600 [Urine:1600] Intake/Output from this shift: Total I/O In: 100 [IV Piggyback:100] Out: -   Labs:  Recent Labs  06/16/15 0338  WBC 10.0  HGB 9.3*  PLT 198  CREATININE 0.70   Estimated Creatinine Clearance: 71.9 mL/min (by C-G formula based on Cr of 0.7).  Microbiology: Recent Results (from the past 720 hour(s))  Urine culture     Status: None   Collection Time: 06/11/15 10:59 PM  Result Value Ref Range Status   Specimen Description URINE, RANDOM  Final   Special Requests NONE  Final   Culture   Final    >=100,000 COLONIES/mL ESCHERICHIA COLI ESBL-EXTENDED SPECTRUM BETA LACTAMASE-THE ORGANISM IS RESISTANT TO PENICILLINS, CEPHALOSPORINS AND AZTREONAM ACCORDING TO CLSI M100-S15 VOL.Friedens. Results Called to: Georgeanna Lea AT 1443 06/14/15 CTJ    Report Status 06/14/2015 FINAL  Final   Organism ID, Bacteria ESCHERICHIA COLI  Final      Susceptibility   Escherichia coli - MIC*    AMPICILLIN >=32 RESISTANT Resistant     CEFTAZIDIME 4 RESISTANT Resistant     CEFAZOLIN >=64 RESISTANT  Resistant     CEFTRIAXONE >=64 RESISTANT Resistant     CIPROFLOXACIN >=4 RESISTANT Resistant     GENTAMICIN <=1 SENSITIVE Sensitive     IMIPENEM <=0.25 SENSITIVE Sensitive     TRIMETH/SULFA >=320 RESISTANT Resistant     Extended ESBL POSITIVE Resistant     NITROFURANTOIN Value in next row Sensitive      SENSITIVE<=16    PIP/TAZO Value in next row Sensitive      SENSITIVE<=4    AMPICILLIN/SULBACTAM Value in next row Sensitive      SENSITIVE8    * >=100,000 COLONIES/mL ESCHERICHIA COLI  Blood Culture (routine x 2)     Status: None   Collection Time: 06/12/15 12:23 AM  Result Value Ref Range Status   Specimen Description BLOOD LEFT ARM  Final   Special Requests BOTTLES DRAWN AEROBIC AND ANAEROBIC 2CC  Final   Culture NO GROWTH 5 DAYS  Final   Report Status 06/17/2015 FINAL  Final  Blood Culture (routine x 2)     Status: None   Collection Time: 06/12/15 12:38 AM  Result Value Ref Range Status   Specimen Description BLOOD RIGHT ARM  Final   Special Requests BOTTLES DRAWN AEROBIC AND ANAEROBIC 2CC  Final   Culture NO GROWTH 5 DAYS  Final   Report Status 06/17/2015 FINAL  Final  C difficile quick scan w PCR reflex ([facility] only)     Status: Abnormal   Collection Time:  06/13/15 10:58 AM  Result Value Ref Range Status   C Diff antigen POSITIVE (A) NEGATIVE Final   C Diff toxin NEGATIVE NEGATIVE Final   C Diff interpretation   Final    Negative for toxigenic C. difficile. Toxin gene and active toxin production not detected. May be a nontoxigenic strain of C. difficile bacteria present, lacking the ability to produce toxin.  Clostridium Difficile by PCR (not at [facility])     Status: None   Collection Time: 06/13/15 10:58 AM  Result Value Ref Range Status   C difficile by pcr NEGATIVE NEGATIVE Final  Stool culture     Status: None   Collection Time: 07/Suzanne/16  9:41 AM  Result Value Ref Range Status   Specimen Description STOOL  Final   Special Requests Normal  Final   Culture   Final    NO  SALMONELLA OR SHIGELLA ISOLATED No Pathogenic E. coli detected NO CAMPYLOBACTER DETECTED REDUCED NORMAL FECAL FLORA    Report Status 06/17/2015 FINAL  Final  C difficile quick scan w PCR reflex ([facility] only)     Status: None   Collection Time: 07/Suzanne/16  9:41 AM  Result Value Ref Range Status   C Diff antigen NEGATIVE NEGATIVE Final   C Diff toxin NEGATIVE NEGATIVE Final   C Diff interpretation Negative for C. difficile  Final    Medical History: Past Medical History  Diagnosis Date  . Arthritis   . Anemia   . GERD (gastroesophageal reflux disease)   . Heart disease   . Hypertension   . Nephrolithiasis   . Heel ulcer   . Diabetes mellitus without complication   . Hyperlipidemia   . Coronary artery disease     Medications:  Scheduled:  . aspirin EC  81 mg Oral Daily  . collagenase   Topical Daily  . folic acid  1 mg Oral BID  . heparin  5,000 Units Subcutaneous BID  . insulin aspart  0-9 Units Subcutaneous TID WC  . LORazepam  0.5 mg Oral 3 times per day  . meropenem (MERREM) IV  1 g Intravenous Q8H  . nystatin  5 mL Oral QID  . pantoprazole  40 mg Oral Daily   Anti-infectives    Start     Dose/Rate Route Frequency Ordered Stop   06/16/15 0830  meropenem (MERREM) 1 g in sodium chloride 0.9 % 100 mL IVPB     1 g 200 mL/hr over 30 Minutes Intravenous Every 8 hours 06/16/15 0819     07/Suzanne/16 1215  meropenem (MERREM) 1 g in sodium chloride 0.9 % 100 mL IVPB  Status:  Discontinued     1 g 200 mL/hr over 30 Minutes Intravenous Every 12 hours 07/Suzanne/16 1204 06/16/15 0819   06/13/15 1730  vancomycin (VANCOCIN) IVPB 1000 mg/200 mL premix  Status:  Discontinued     1,000 mg 200 mL/hr over 60 Minutes Intravenous Every 24 hours 06/13/15 1258 07/Suzanne/16 1205   06/13/15 1400  aztreonam (AZACTAM) 1 g in dextrose 5 % 50 mL IVPB  Status:  Discontinued     1 g 100 mL/hr over 30 Minutes Intravenous 3 times per day 06/13/15 1301 07/Suzanne/16 1205   06/12/15 1730  vancomycin (VANCOCIN) IVPB  1000 mg/200 mL premix  Status:  Discontinued     1,000 mg 200 mL/hr over 60 Minutes Intravenous Every 36 hours 06/12/15 0433 06/13/15 1258   06/12/15 0900  metroNIDAZOLE (FLAGYL) IVPB 500 mg  Status:  Discontinued  500 mg 100 mL/hr over 60 Minutes Intravenous Every 8 hours 06/12/15 0345 07/Suzanne/16 1205   06/12/15 0800  aztreonam (AZACTAM) 500 mg in dextrose 5 % 50 mL IVPB  Status:  Discontinued     500 mg 100 mL/hr over 30 Minutes Intravenous 3 times per day 06/12/15 0357 06/13/15 1301   06/11/15 2330  aztreonam (AZACTAM) 2 g in dextrose 5 % 50 mL IVPB     2 g 100 mL/hr over 30 Minutes Intravenous  Once 06/11/15 2318 06/12/15 0016   06/11/15 2330  metroNIDAZOLE (FLAGYL) IVPB 500 mg     500 mg 100 mL/hr over 60 Minutes Intravenous  Once 06/11/15 2318 06/12/15 0205   06/11/15 2330  vancomycin (VANCOCIN) IVPB 1000 mg/200 mL premix     1,000 mg 200 mL/hr over 60 Minutes Intravenous  Once 06/11/15 2318 06/12/15 5248     Assessment: 69 yo female with Ewing, Suzanne Ewing, Suzanne Ewing. Abx vanc/aztreonam/Flagyl narrowed to meropenem. MD aware of PCN allergy.  UCx ESBL E coli sensitive to imipenem BCx x2 NGTD x3 days Cdiff neg  SCr: 0.7 (0.8), est CrCl~72 mL/min  Plan:  Will continue patient on meropenem 1 gm IV q8h based on improved renal function.   Pharmacy will continue to follow.  Murrell Converse, PharmD Clinical Pharmacist 06/18/2015

## 2015-06-28 ENCOUNTER — Encounter (HOSPITAL_BASED_OUTPATIENT_CLINIC_OR_DEPARTMENT_OTHER): Payer: Medicare Other

## 2015-09-25 IMAGING — CR DG CHEST 1V PORT
1 series · 1 of 1 positions shown · non-contrast
Comparison: January 18, 2015.

CLINICAL DATA: Status post PICC line placement.

EXAM:
PORTABLE CHEST - 1 VIEW

[ap]
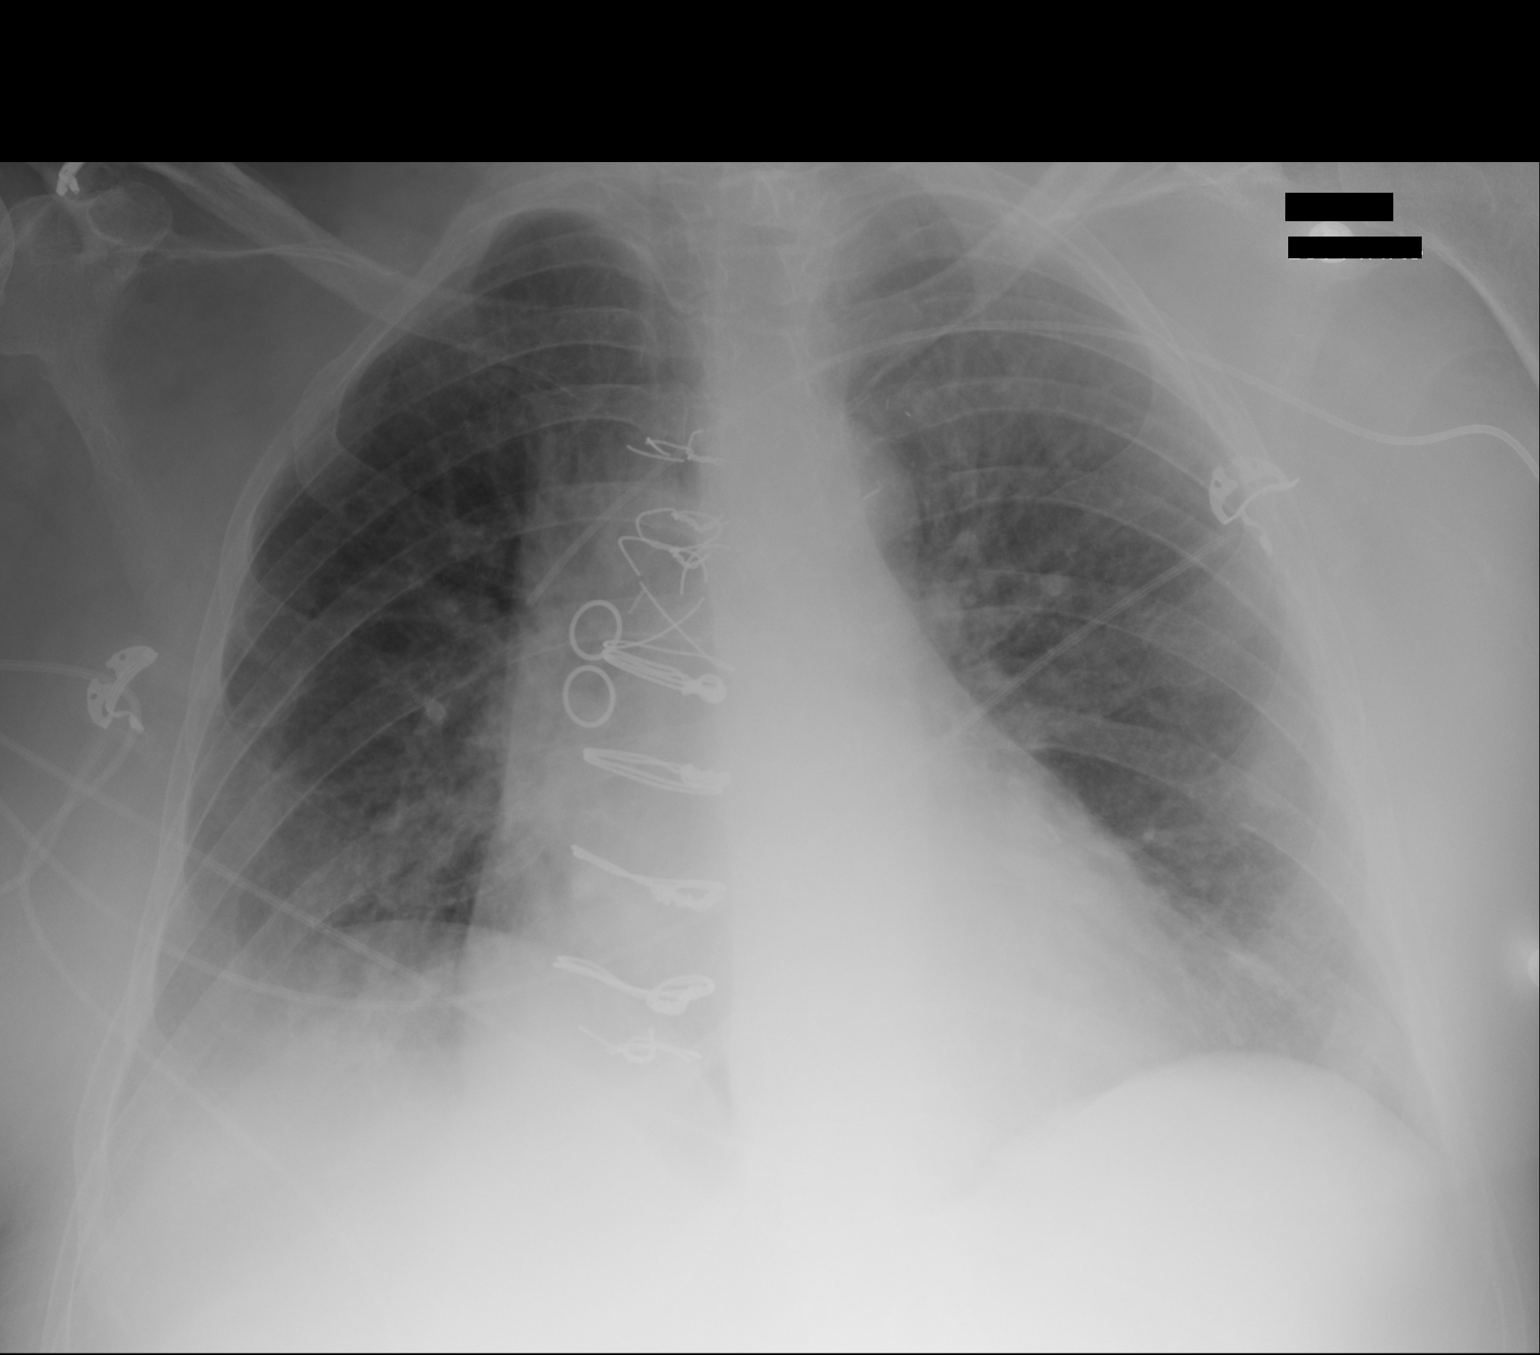

[1 of 1 positions shown; findings below may reference images not displayed]

FINDINGS: Stable cardiomegaly. Status post coronary artery bypass graft. No
pneumothorax is noted. Mild central pulmonary vascular congestion is
noted. Left-sided PICC line is noted with distal tip in expected
position of the SVC. Increased right lateral basilar opacity is
noted concerning for pneumonia or atelectasis with possible
associated effusion. Bony thorax is intact.
IMPRESSION: Left-sided PICC line is in grossly good position with distal tip
overlying expected position of the SVC. Increased right lateral
basilar opacity is noted concerning for pneumonia or atelectasis
with associated pleural effusion.

## 2015-11-29 ENCOUNTER — Emergency Department: Payer: Medicare Other

## 2015-11-29 ENCOUNTER — Inpatient Hospital Stay
Admission: EM | Admit: 2015-11-29 | Discharge: 2015-12-06 | DRG: 698 | Disposition: A | Discharge status: Skilled Nursing Facility | Payer: Medicare Other | Attending: Internal Medicine | Admitting: Internal Medicine

## 2015-11-29 DIAGNOSIS — E1151 Type 2 diabetes mellitus with diabetic peripheral angiopathy without gangrene: Secondary | ICD-10-CM | POA: Diagnosis present

## 2015-11-29 DIAGNOSIS — D3502 Benign neoplasm of left adrenal gland: Secondary | ICD-10-CM | POA: Diagnosis present

## 2015-11-29 DIAGNOSIS — E785 Hyperlipidemia, unspecified: Secondary | ICD-10-CM | POA: Diagnosis present

## 2015-11-29 DIAGNOSIS — Y92129 Unspecified place in nursing home as the place of occurrence of the external cause: Secondary | ICD-10-CM

## 2015-11-29 DIAGNOSIS — E11622 Type 2 diabetes mellitus with other skin ulcer: Secondary | ICD-10-CM | POA: Diagnosis present

## 2015-11-29 DIAGNOSIS — L89323 Pressure ulcer of left buttock, stage 3: Secondary | ICD-10-CM | POA: Diagnosis present

## 2015-11-29 DIAGNOSIS — E119 Type 2 diabetes mellitus without complications: Secondary | ICD-10-CM

## 2015-11-29 DIAGNOSIS — R102 Pelvic and perineal pain: Secondary | ICD-10-CM | POA: Diagnosis not present

## 2015-11-29 DIAGNOSIS — L97429 Non-pressure chronic ulcer of left heel and midfoot with unspecified severity: Secondary | ICD-10-CM

## 2015-11-29 DIAGNOSIS — G9341 Metabolic encephalopathy: Secondary | ICD-10-CM

## 2015-11-29 DIAGNOSIS — R188 Other ascites: Secondary | ICD-10-CM | POA: Diagnosis present

## 2015-11-29 DIAGNOSIS — Z823 Family history of stroke: Secondary | ICD-10-CM

## 2015-11-29 DIAGNOSIS — Z7982 Long term (current) use of aspirin: Secondary | ICD-10-CM

## 2015-11-29 DIAGNOSIS — Z452 Encounter for adjustment and management of vascular access device: Secondary | ICD-10-CM

## 2015-11-29 DIAGNOSIS — I2510 Atherosclerotic heart disease of native coronary artery without angina pectoris: Secondary | ICD-10-CM | POA: Diagnosis present

## 2015-11-29 DIAGNOSIS — B9620 Unspecified Escherichia coli [E. coli] as the cause of diseases classified elsewhere: Secondary | ICD-10-CM | POA: Diagnosis present

## 2015-11-29 DIAGNOSIS — N200 Calculus of kidney: Secondary | ICD-10-CM | POA: Diagnosis present

## 2015-11-29 DIAGNOSIS — I70209 Unspecified atherosclerosis of native arteries of extremities, unspecified extremity: Secondary | ICD-10-CM

## 2015-11-29 DIAGNOSIS — Z9889 Other specified postprocedural states: Secondary | ICD-10-CM

## 2015-11-29 DIAGNOSIS — J90 Pleural effusion, not elsewhere classified: Secondary | ICD-10-CM | POA: Diagnosis present

## 2015-11-29 DIAGNOSIS — Z794 Long term (current) use of insulin: Secondary | ICD-10-CM

## 2015-11-29 DIAGNOSIS — K219 Gastro-esophageal reflux disease without esophagitis: Secondary | ICD-10-CM | POA: Diagnosis present

## 2015-11-29 DIAGNOSIS — Z7952 Long term (current) use of systemic steroids: Secondary | ICD-10-CM

## 2015-11-29 DIAGNOSIS — I700 Atherosclerosis of aorta: Secondary | ICD-10-CM | POA: Diagnosis present

## 2015-11-29 DIAGNOSIS — Z7401 Bed confinement status: Secondary | ICD-10-CM

## 2015-11-29 DIAGNOSIS — Z7983 Long term (current) use of bisphosphonates: Secondary | ICD-10-CM

## 2015-11-29 DIAGNOSIS — T83511A Infection and inflammatory reaction due to indwelling urethral catheter, initial encounter: Secondary | ICD-10-CM | POA: Diagnosis not present

## 2015-11-29 DIAGNOSIS — E11621 Type 2 diabetes mellitus with foot ulcer: Secondary | ICD-10-CM | POA: Diagnosis present

## 2015-11-29 DIAGNOSIS — K633 Ulcer of intestine: Secondary | ICD-10-CM

## 2015-11-29 DIAGNOSIS — E11649 Type 2 diabetes mellitus with hypoglycemia without coma: Secondary | ICD-10-CM | POA: Diagnosis present

## 2015-11-29 DIAGNOSIS — E871 Hypo-osmolality and hyponatremia: Secondary | ICD-10-CM

## 2015-11-29 DIAGNOSIS — Z9981 Dependence on supplemental oxygen: Secondary | ICD-10-CM

## 2015-11-29 DIAGNOSIS — R1310 Dysphagia, unspecified: Secondary | ICD-10-CM | POA: Diagnosis present

## 2015-11-29 DIAGNOSIS — B964 Proteus (mirabilis) (morganii) as the cause of diseases classified elsewhere: Secondary | ICD-10-CM | POA: Diagnosis present

## 2015-11-29 DIAGNOSIS — N210 Calculus in bladder: Secondary | ICD-10-CM | POA: Diagnosis present

## 2015-11-29 DIAGNOSIS — Z1612 Extended spectrum beta lactamase (ESBL) resistance: Secondary | ICD-10-CM | POA: Diagnosis present

## 2015-11-29 DIAGNOSIS — Z79899 Other long term (current) drug therapy: Secondary | ICD-10-CM

## 2015-11-29 DIAGNOSIS — N10 Acute tubulo-interstitial nephritis: Secondary | ICD-10-CM | POA: Diagnosis present

## 2015-11-29 DIAGNOSIS — J9601 Acute respiratory failure with hypoxia: Secondary | ICD-10-CM | POA: Diagnosis not present

## 2015-11-29 DIAGNOSIS — K9401 Colostomy hemorrhage: Secondary | ICD-10-CM | POA: Diagnosis present

## 2015-11-29 DIAGNOSIS — Z833 Family history of diabetes mellitus: Secondary | ICD-10-CM

## 2015-11-29 DIAGNOSIS — K7460 Unspecified cirrhosis of liver: Secondary | ICD-10-CM | POA: Diagnosis present

## 2015-11-29 DIAGNOSIS — N390 Urinary tract infection, site not specified: Secondary | ICD-10-CM

## 2015-11-29 DIAGNOSIS — Z7984 Long term (current) use of oral hypoglycemic drugs: Secondary | ICD-10-CM

## 2015-11-29 DIAGNOSIS — R0600 Dyspnea, unspecified: Secondary | ICD-10-CM

## 2015-11-29 DIAGNOSIS — Z87440 Personal history of urinary (tract) infections: Secondary | ICD-10-CM

## 2015-11-29 DIAGNOSIS — I071 Rheumatic tricuspid insufficiency: Secondary | ICD-10-CM | POA: Diagnosis present

## 2015-11-29 DIAGNOSIS — Z87891 Personal history of nicotine dependence: Secondary | ICD-10-CM

## 2015-11-29 DIAGNOSIS — R627 Adult failure to thrive: Secondary | ICD-10-CM | POA: Diagnosis present

## 2015-11-29 DIAGNOSIS — A498 Other bacterial infections of unspecified site: Secondary | ICD-10-CM

## 2015-11-29 DIAGNOSIS — I5023 Acute on chronic systolic (congestive) heart failure: Secondary | ICD-10-CM | POA: Diagnosis not present

## 2015-11-29 DIAGNOSIS — Z951 Presence of aortocoronary bypass graft: Secondary | ICD-10-CM

## 2015-11-29 DIAGNOSIS — Y732 Prosthetic and other implants, materials and accessory gastroenterology and urology devices associated with adverse incidents: Secondary | ICD-10-CM | POA: Diagnosis present

## 2015-11-29 DIAGNOSIS — I110 Hypertensive heart disease with heart failure: Secondary | ICD-10-CM | POA: Diagnosis present

## 2015-11-29 DIAGNOSIS — Z880 Allergy status to penicillin: Secondary | ICD-10-CM

## 2015-11-29 DIAGNOSIS — M1990 Unspecified osteoarthritis, unspecified site: Secondary | ICD-10-CM | POA: Diagnosis present

## 2015-11-29 DIAGNOSIS — O26899 Other specified pregnancy related conditions, unspecified trimester: Secondary | ICD-10-CM

## 2015-11-29 DIAGNOSIS — K9419 Other complications of enterostomy: Secondary | ICD-10-CM

## 2015-11-29 DIAGNOSIS — Z8249 Family history of ischemic heart disease and other diseases of the circulatory system: Secondary | ICD-10-CM

## 2015-11-29 DIAGNOSIS — J9602 Acute respiratory failure with hypercapnia: Secondary | ICD-10-CM

## 2015-11-29 DIAGNOSIS — J9811 Atelectasis: Secondary | ICD-10-CM | POA: Diagnosis present

## 2015-11-29 LAB — CBC WITH DIFFERENTIAL/PLATELET
BASOS PCT: 0 %
Basophils Absolute: 0 10*3/uL (ref 0–0.1)
EOS ABS: 0 10*3/uL (ref 0–0.7)
EOS PCT: 0 %
HCT: 28.8 % — ABNORMAL LOW (ref 35.0–47.0)
Hemoglobin: 8.9 g/dL — ABNORMAL LOW (ref 12.0–16.0)
LYMPHS ABS: 0.6 10*3/uL — AB (ref 1.0–3.6)
Lymphocytes Relative: 13 %
MCH: 23.9 pg — AB (ref 26.0–34.0)
MCHC: 31 g/dL — AB (ref 32.0–36.0)
MCV: 76.9 fL — ABNORMAL LOW (ref 80.0–100.0)
MONOS PCT: 5 %
Monocytes Absolute: 0.2 10*3/uL (ref 0.2–0.9)
Neutro Abs: 3.6 10*3/uL (ref 1.4–6.5)
Neutrophils Relative %: 82 %
PLATELETS: 208 10*3/uL (ref 150–440)
RBC: 3.75 MIL/uL — ABNORMAL LOW (ref 3.80–5.20)
RDW: 18.6 % — ABNORMAL HIGH (ref 11.5–14.5)
WBC: 4.4 10*3/uL (ref 3.6–11.0)

## 2015-11-29 LAB — COMPREHENSIVE METABOLIC PANEL
ALK PHOS: 65 U/L (ref 38–126)
ALT: 10 U/L — ABNORMAL LOW (ref 14–54)
AST: 14 U/L — AB (ref 15–41)
Albumin: 2.8 g/dL — ABNORMAL LOW (ref 3.5–5.0)
Anion gap: 7 (ref 5–15)
BUN: 27 mg/dL — AB (ref 6–20)
CALCIUM: 10.2 mg/dL (ref 8.9–10.3)
CHLORIDE: 104 mmol/L (ref 101–111)
CO2: 26 mmol/L (ref 22–32)
Creatinine, Ser: 0.62 mg/dL (ref 0.44–1.00)
GFR calc Af Amer: >60 mL/min (ref >60)
GFR calc non Af Amer: >60 mL/min (ref >60)
Glucose, Bld: 115 mg/dL — ABNORMAL HIGH (ref 65–99)
Potassium: 3.7 mmol/L (ref 3.5–5.1)
Sodium: 137 mmol/L (ref 135–145)
Total Bilirubin: 0.6 mg/dL (ref 0.3–1.2)
Total Protein: 7.2 g/dL (ref 6.5–8.1)

## 2015-11-29 LAB — URINALYSIS COMPLETE WITH MICROSCOPIC (ARMC ONLY)
BILIRUBIN URINE: NEGATIVE
Bacteria, UA: NONE SEEN
Glucose, UA: NEGATIVE mg/dL
Ketones, ur: NEGATIVE mg/dL
Nitrite: POSITIVE — AB
PROTEIN: 100 mg/dL — AB
SQUAMOUS EPITHELIAL / LPF: NONE SEEN
Specific Gravity, Urine: 1.013 (ref 1.005–1.030)
pH: 7 (ref 5.0–8.0)

## 2015-11-29 LAB — LACTIC ACID, PLASMA: LACTIC ACID, VENOUS: 2 mmol/L (ref 0.5–2.0)

## 2015-11-29 MED ORDER — LEVOFLOXACIN IN D5W 750 MG/150ML IV SOLN
750.0000 mg | INTRAVENOUS | Status: DC
  Administered 2015-11-29 – 2015-12-01 (×2): 750 mg via INTRAVENOUS
  Filled 2015-11-29 (×3): qty 150

## 2015-11-29 NOTE — ED Notes (Signed)
Pt to xray & CT via stretcher accomp by CT tech; ED secretary Vaughan Basta Ward) notified to call CODE sepsis

## 2015-11-29 NOTE — ED Notes (Signed)
Pt to room 3 via [place] from [employer]; pt A&Ox3 with no distress noted; EMS reports staff report of fever 100, admin 1gm tylenol; also informed that yesterday noted foley cath was leaking & causing pain so cath changed; pt c/o persistent pain to cath site; EMS reports urine noted to be cloudy with sediment upon their arrival

## 2015-11-29 NOTE — ED Provider Notes (Signed)
Time Seen: Approximately ----------------------------------------- 9:25 PM on 11/29/2015 -----------------------------------------   I have reviewed the triage notes  Chief Complaint: Fever   History of Present Illness: Jandi Gonet is a 70 y.o. female who had some issues with her urine catheter yesterday and was changed out the nursing facility. Patient complains of persistent discomfort around the catheter site. Apparently yesterday been draining around the catheter and then it was changed. Patient was noted to develop a fever today and was transported here to emergency department. Patient has a history of urinary tract infections. She states she's been on multiple antibiotics in the past. Patient denies any chest pain or shortness of breath. She is on a chronic 2 L nasal cannula.   Past Medical History  Diagnosis Date  . Arthritis   . Anemia   . GERD (gastroesophageal reflux disease)   . Heart disease   . Hypertension   . Nephrolithiasis   . Heel ulcer   . Diabetes mellitus without complication   . Hyperlipidemia   . Coronary artery disease     Patient Active Problem List   Diagnosis Date Noted  . Sepsis secondary to UTI ([place]) 06/12/2015  . Cellulitis and abscess 06/12/2015  . Acute renal failure ([place]) 06/12/2015  . Hyperkalemia 06/12/2015  . Anemia 06/12/2015  . CAD (coronary artery disease) 06/12/2015  . DM (diabetes mellitus) ([place]) 06/12/2015  . HTN (hypertension) 06/12/2015  . Rheumatoid arthritis ([place]) 06/12/2015  . GERD (gastroesophageal reflux disease) 06/12/2015  . DM2 (diabetes mellitus, type 2) ([place]) 06/12/2015    Past Surgical History  Procedure Laterality Date  . Coronary artery bypass graft    . Back surgery    . Cholecystectomy    . Shoulder surgery    . Knee surgery Left   . Carpal tunnel release Bilateral   . Joint replacement      Past Surgical History  Procedure Laterality Date  . Coronary artery bypass graft    . Back surgery    .  Cholecystectomy    . Shoulder surgery    . Knee surgery Left   . Carpal tunnel release Bilateral   . Joint replacement      Current Outpatient Rx  Name  Route  Sig  Dispense  Refill  . acetaminophen (TYLENOL) 325 MG tablet   Oral   Take 2 tablets (650 mg total) by mouth every 6 (six) hours as needed for mild pain (or Fever >/= 101).   60 tablet   0   . ascorbic acid (VITAMIN C) 1000 MG tablet   Oral   Take 1,000 mg by mouth daily.         Marland Kitchen aspirin EC 81 MG EC tablet   Oral   Take 1 tablet (81 mg total) by mouth daily.   30 tablet   0   . bismuth subsalicylate (PEPTO BISMOL) 262 MG/15ML suspension   Oral   Take 30 mLs by mouth every 4 (four) hours as needed for diarrhea or loose stools.   360 mL   0   . Calcium-Vitamin D (CALTRATE 600 PLUS-VIT D PO)   Oral   Take by mouth.         Geronimo Boot XT 120 MG 24 hr capsule   Oral   Take 1 tablet by mouth daily.           Dispense as written.   . collagenase (SANTYL) ointment   Topical   Apply topically daily.   15 g  0   . Cranberry 500 MG CAPS   Oral   Take 500 mg by mouth daily.         . cyanocobalamin (,VITAMIN B-12,) 1000 MCG/ML injection   Intramuscular   Inject 1,000 mcg into the muscle every 30 (thirty) days.         . diphenhydrAMINE (SOMINEX) 25 MG tablet   Oral   Take 25 mg by mouth at bedtime as needed for sleep.         . ferrous sulfate 325 (65 FE) MG tablet   Oral   Take 325 mg by mouth daily with breakfast.         . folic acid (FOLVITE) 1 MG tablet   Oral   Take 1 mg by mouth 2 (two) times daily.         . furosemide (LASIX) 40 MG tablet   Oral   Take 40 mg by mouth.         Marland Kitchen glucose blood test strip   Other   1 each by Other route 3 (three) times daily. Use as instructed         . insulin aspart (NOVOLOG) 100 UNIT/ML injection   Subcutaneous   Inject 0-9 Units into the skin 3 (three) times daily with meals.   10 mL   11   . levocetirizine (XYZAL) 5 MG  tablet   Oral   Take 5 mg by mouth every evening.         . lidocaine (LIDODERM) 5 %   Transdermal   Place 1 patch onto the skin.         Marland Kitchen lisinopril (PRINIVIL,ZESTRIL) 20 MG tablet   Oral   Take 20 mg by mouth daily.         Marland Kitchen loperamide (IMODIUM) 2 MG capsule   Oral   Take 1 capsule (2 mg total) by mouth every 6 (six) hours as needed for diarrhea or loose stools.   30 capsule   0   . LORazepam (ATIVAN) 0.5 MG tablet   Oral   Take 0.5 mg by mouth every 8 (eight) hours. Take 1/2 to 1(one) tablet by mouth once daily as needed for anxiety per medication list         . Multiple Vitamins-Minerals (CENTRUM SILVER PO)   Oral   Take by mouth.         . nystatin (MYCOSTATIN) 100000 UNIT/ML suspension   Oral   Take 5 mLs (500,000 Units total) by mouth 4 (four) times daily.   60 mL   0   . Omega-3 Fatty Acids (FISH OIL) 1000 MG CAPS   Oral   Take by mouth.         Marland Kitchen omeprazole (PRILOSEC) 20 MG capsule   Oral   Take 20 mg by mouth daily.         . ondansetron (ZOFRAN) 4 MG/2ML SOLN injection   Intravenous   Inject 2 mLs (4 mg total) into the vein every 6 (six) hours as needed for nausea.   2 mL   0   . potassium chloride (KLOR-CON M10) 10 MEQ tablet   Oral   Take 1 tablet (10 mEq total) by mouth daily. Take with Lasix   30 tablet   0   . predniSONE (DELTASONE) 1 MG tablet   Oral   Take 1 mg by mouth daily with breakfast.          . risedronate (ACTONEL) 35 MG  tablet   Oral   Take 35 mg by mouth every 7 (seven) days.          . traMADol (ULTRAM) 50 MG tablet   Oral   Take 50 mg by mouth every 6 (six) hours as needed.          . vitamin A 10000 UNIT capsule   Oral   Take 10,000 Units by mouth daily.            Allergies:  Macrobid; Penicillin g; Penicillins; Tape; Clarithromycin; Nitroglycerin; Leflunomide; Morphine; and Naproxen  Family History: Family History  Problem Relation Age of Onset  . Stroke Mother   . Diabetes type II  Sister   . Hypertension Sister     Social History: Social History  Substance Use Topics  . Smoking status: Former Smoker    Quit date: 06/03/2014  . Smokeless tobacco: Never Used  . Alcohol Use: 0.0 oz/week    0 Standard drinks or equivalent per week     Review of Systems:   10 point review of systems was performed and was otherwise negative:  Constitutional: Fever was noted by the nursing home and was noted to be 102 patient received Tylenol prior to arrival Eyes: No visual disturbances ENT: No sore throat, ear pain Cardiac: No chest pain Respiratory: No shortness of breath, wheezing, or stridor Abdomen: No abdominal pain she has normal drainage from her colostomy site, no vomiting, No diarrhea Endocrine: No weight loss, No night sweats Extremities: No peripheral edema, cyanosis Skin: No rashes, easy bruising Neurologic: No focal weakness, trouble with speech or swollowing Urologic: Patient's had continued drainage from her Foley catheter. She denies any further leakage.   Physical Exam:  ED Triage Vitals  Enc Vitals Group     BP 11/29/15 2115 140/65 mmHg     Pulse Rate 11/29/15 2115 82     Resp 11/29/15 2115 22     Temp 11/29/15 2115 98.1 F (36.7 C)     Temp Source 11/29/15 2115 Oral     SpO2 11/29/15 2115 97 %     Weight 11/29/15 2115 164 lb 14.4 oz (74.798 kg)     Height 11/29/15 2115 5\' 6"  (1.676 m)     Head Cir --      [facility] Flow --      Pain Score 11/29/15 2115 5     Pain Loc --      Pain Edu? --      Excl. in [place]? --     General: Awake , Alert , and Oriented times 3; GCS 15 Head: Normal cephalic , atraumatic Eyes: Pupils equal , round, reactive to light Nose/Throat: No nasal drainage, patent upper airway without erythema or exudate.  Neck: Supple, Full range of motion, No anterior adenopathy or palpable thyroid masses Lungs: Diminished breath sounds bilaterally to bases without any rhonchi or rales. Heart: Regular rate, regular rhythm without murmurs ,  gallops , or rubs Abdomen: Soft, non tender without rebound, guarding , or rigidity; bowel sounds positive and symmetric in all 4 quadrants. No organomegaly .       Colostomy site appears normal without any significant unusual drainage or tenderness. There is mild tenderness to deep palpation towards the suprapubic region Extremities: 2 plus symmetric pulses. No edema, clubbing or cyanosis Neurologic: Patient's nonambulatory and has bilateral lower extremity neurologic deficits which are old Skin: warm, dry, no rashes   Labs:   All laboratory work was reviewed including any pertinent negatives or  positives listed below:  Labs Reviewed  URINE CULTURE  CULTURE, BLOOD (ROUTINE X 2)  CULTURE, BLOOD (ROUTINE X 2)  URINALYSIS COMPLETEWITH MICROSCOPIC ([facility] ONLY)  COMPREHENSIVE METABOLIC PANEL  CBC WITH DIFFERENTIAL/PLATELET  LACTIC ACID, PLASMA  LACTIC ACID, PLASMA   Review of laboratory work shows what appears to be a significant urinary tract infection. Lactic acid is negative and the patient's white blood cell count is normal and I felt she was unlikely to be septic at this time.   Radiology:      I personally reviewed the radiologic studies  CT ABDOMEN AND PELVIS WITHOUT CONTRAST  TECHNIQUE: Multidetector CT imaging of the abdomen and pelvis was performed following the standard protocol without IV contrast.  COMPARISON: CT scan of August 01, 2007.  FINDINGS: Status post surgical posterior fusion of L3, L4 and L5. Large right pleural effusion is noted with right lower lobe atelectasis.  Status post cholecystectomy. Hepatic cirrhosis is noted. Splenic granuloma is noted. Pancreas is unremarkable. Stable left adrenal adenoma is noted. Right adrenal gland appears normal. Bilateral nephrolithiasis is noted. No hydronephrosis or renal obstruction is noted. Atherosclerosis of abdominal aorta is noted without aneurysm formation. Stable adenopathy is noted in retroperitoneal  and porta hepatis regions. The appendix appears normal. There is no evidence of bowel obstruction. Urinary bladder is decompressed with Foley catheter. Uterine atrophy is noted. Small amount of free fluid is noted in the dependent portion of the pelvis. Continued presence of large hernia anteriorly in the pelvis which contains loops of small bowel without obstruction. Bladder calculi are noted.  IMPRESSION: Large right pleural effusion with atelectasis of the right lower lobe.  Hepatic cirrhosis is noted.  Stable left adrenal adenoma.  Atherosclerosis of abdominal aorta without aneurysm formation.  Bilateral nephrolithiasis is noted without hydronephrosis or renal obstruction.  Urinary bladder is decompressed secondary to Foley catheter. Multiple bladder calculi are noted.  Continued presence of large hernia involving anterior pelvic wall which contains loops of small bowel without obstruction.    EXAM: CHEST 2 VIEW  COMPARISON: 06/17/2015  FINDINGS: Patient is rotated limiting assessment. There is significant volume loss in the right hemithorax with hazy opacity throughout right lower lung zone. Patient is post median sternotomy. Cardiomediastinal contours are obscured. Suspect small left pleural effusion. Minimal vascular congestion without overt edema.  IMPRESSION: Rotated exam. Volume loss in the right hemithorax with hazy opacities throughout the right lower lung zone, suspect combination of atelectasis and pleural effusion. Underlying pneumonia not excluded. Small left pleural effusion.  Recommend continued radiographic follow-up. Should volume loss persist, ultimately chest CT may be helpful.    ED Course:  Patient's findings on her chest x-ray are nonspecific and may be chronic in nature since she is on supplemental oxygen. The abdominal CT was performed to make sure there was no signs of renal colic in the face of a urinary tract infection which  patient's had before in the past. Cultures and urine culture pending at this time and she was started on some IV Levaquin due to her history of allergies to penicillin related antibiotics. She otherwise is hemodynamically stable and there was no dip in her blood pressure and I did not feel fluid resuscitation was necessary at this time.    Assessment:  Acute urinary tract infection Chronic indwelling urinary catheter      Plan: Inpatient management            Daymon Larsen, MD 11/29/15 2245

## 2015-11-29 NOTE — ED Notes (Signed)
Notified Dr. Marcelene Butte of lactate of 2 and need for IV fluids and antibiotics. Stated to get back with me

## 2015-11-29 NOTE — ED Notes (Signed)
BC drawn left hand

## 2015-11-30 ENCOUNTER — Encounter: Payer: Self-pay | Admitting: Internal Medicine

## 2015-11-30 ENCOUNTER — Inpatient Hospital Stay: Payer: Medicare Other

## 2015-11-30 DIAGNOSIS — Z79899 Other long term (current) drug therapy: Secondary | ICD-10-CM | POA: Diagnosis not present

## 2015-11-30 DIAGNOSIS — E11649 Type 2 diabetes mellitus with hypoglycemia without coma: Secondary | ICD-10-CM | POA: Diagnosis present

## 2015-11-30 DIAGNOSIS — I071 Rheumatic tricuspid insufficiency: Secondary | ICD-10-CM | POA: Diagnosis present

## 2015-11-30 DIAGNOSIS — Z7983 Long term (current) use of bisphosphonates: Secondary | ICD-10-CM | POA: Diagnosis not present

## 2015-11-30 DIAGNOSIS — J9811 Atelectasis: Secondary | ICD-10-CM | POA: Diagnosis present

## 2015-11-30 DIAGNOSIS — L89323 Pressure ulcer of left buttock, stage 3: Secondary | ICD-10-CM | POA: Diagnosis present

## 2015-11-30 DIAGNOSIS — I70209 Unspecified atherosclerosis of native arteries of extremities, unspecified extremity: Secondary | ICD-10-CM | POA: Diagnosis present

## 2015-11-30 DIAGNOSIS — N10 Acute tubulo-interstitial nephritis: Secondary | ICD-10-CM | POA: Diagnosis present

## 2015-11-30 DIAGNOSIS — Z9981 Dependence on supplemental oxygen: Secondary | ICD-10-CM | POA: Diagnosis not present

## 2015-11-30 DIAGNOSIS — B964 Proteus (mirabilis) (morganii) as the cause of diseases classified elsewhere: Secondary | ICD-10-CM | POA: Diagnosis present

## 2015-11-30 DIAGNOSIS — J90 Pleural effusion, not elsewhere classified: Secondary | ICD-10-CM | POA: Diagnosis present

## 2015-11-30 DIAGNOSIS — R1310 Dysphagia, unspecified: Secondary | ICD-10-CM | POA: Diagnosis present

## 2015-11-30 DIAGNOSIS — Z7982 Long term (current) use of aspirin: Secondary | ICD-10-CM | POA: Diagnosis not present

## 2015-11-30 DIAGNOSIS — I700 Atherosclerosis of aorta: Secondary | ICD-10-CM | POA: Diagnosis present

## 2015-11-30 DIAGNOSIS — G9341 Metabolic encephalopathy: Secondary | ICD-10-CM | POA: Diagnosis present

## 2015-11-30 DIAGNOSIS — I110 Hypertensive heart disease with heart failure: Secondary | ICD-10-CM | POA: Diagnosis present

## 2015-11-30 DIAGNOSIS — Z951 Presence of aortocoronary bypass graft: Secondary | ICD-10-CM | POA: Diagnosis not present

## 2015-11-30 DIAGNOSIS — K9419 Other complications of enterostomy: Secondary | ICD-10-CM | POA: Diagnosis not present

## 2015-11-30 DIAGNOSIS — E871 Hypo-osmolality and hyponatremia: Secondary | ICD-10-CM | POA: Diagnosis present

## 2015-11-30 DIAGNOSIS — N200 Calculus of kidney: Secondary | ICD-10-CM | POA: Diagnosis present

## 2015-11-30 DIAGNOSIS — K9401 Colostomy hemorrhage: Secondary | ICD-10-CM | POA: Diagnosis present

## 2015-11-30 DIAGNOSIS — E11622 Type 2 diabetes mellitus with other skin ulcer: Secondary | ICD-10-CM | POA: Diagnosis present

## 2015-11-30 DIAGNOSIS — K219 Gastro-esophageal reflux disease without esophagitis: Secondary | ICD-10-CM | POA: Diagnosis present

## 2015-11-30 DIAGNOSIS — Z87891 Personal history of nicotine dependence: Secondary | ICD-10-CM | POA: Diagnosis not present

## 2015-11-30 DIAGNOSIS — I2510 Atherosclerotic heart disease of native coronary artery without angina pectoris: Secondary | ICD-10-CM | POA: Diagnosis present

## 2015-11-30 DIAGNOSIS — Z833 Family history of diabetes mellitus: Secondary | ICD-10-CM | POA: Diagnosis not present

## 2015-11-30 DIAGNOSIS — J9601 Acute respiratory failure with hypoxia: Secondary | ICD-10-CM | POA: Diagnosis not present

## 2015-11-30 DIAGNOSIS — Z7952 Long term (current) use of systemic steroids: Secondary | ICD-10-CM | POA: Diagnosis not present

## 2015-11-30 DIAGNOSIS — Z7984 Long term (current) use of oral hypoglycemic drugs: Secondary | ICD-10-CM | POA: Diagnosis not present

## 2015-11-30 DIAGNOSIS — L97429 Non-pressure chronic ulcer of left heel and midfoot with unspecified severity: Secondary | ICD-10-CM | POA: Diagnosis present

## 2015-11-30 DIAGNOSIS — Z880 Allergy status to penicillin: Secondary | ICD-10-CM | POA: Diagnosis not present

## 2015-11-30 DIAGNOSIS — N210 Calculus in bladder: Secondary | ICD-10-CM | POA: Diagnosis present

## 2015-11-30 DIAGNOSIS — Z823 Family history of stroke: Secondary | ICD-10-CM | POA: Diagnosis not present

## 2015-11-30 DIAGNOSIS — R627 Adult failure to thrive: Secondary | ICD-10-CM | POA: Diagnosis present

## 2015-11-30 DIAGNOSIS — N390 Urinary tract infection, site not specified: Secondary | ICD-10-CM | POA: Diagnosis not present

## 2015-11-30 DIAGNOSIS — E11621 Type 2 diabetes mellitus with foot ulcer: Secondary | ICD-10-CM | POA: Diagnosis present

## 2015-11-30 DIAGNOSIS — Y732 Prosthetic and other implants, materials and accessory gastroenterology and urology devices associated with adverse incidents: Secondary | ICD-10-CM | POA: Diagnosis present

## 2015-11-30 DIAGNOSIS — Z8249 Family history of ischemic heart disease and other diseases of the circulatory system: Secondary | ICD-10-CM | POA: Diagnosis not present

## 2015-11-30 DIAGNOSIS — B9620 Unspecified Escherichia coli [E. coli] as the cause of diseases classified elsewhere: Secondary | ICD-10-CM | POA: Diagnosis present

## 2015-11-30 DIAGNOSIS — Y92129 Unspecified place in nursing home as the place of occurrence of the external cause: Secondary | ICD-10-CM | POA: Diagnosis not present

## 2015-11-30 DIAGNOSIS — Z1612 Extended spectrum beta lactamase (ESBL) resistance: Secondary | ICD-10-CM | POA: Diagnosis present

## 2015-11-30 DIAGNOSIS — Z87440 Personal history of urinary (tract) infections: Secondary | ICD-10-CM | POA: Diagnosis not present

## 2015-11-30 DIAGNOSIS — I5023 Acute on chronic systolic (congestive) heart failure: Secondary | ICD-10-CM | POA: Diagnosis not present

## 2015-11-30 DIAGNOSIS — D3502 Benign neoplasm of left adrenal gland: Secondary | ICD-10-CM | POA: Diagnosis present

## 2015-11-30 DIAGNOSIS — T83511A Infection and inflammatory reaction due to indwelling urethral catheter, initial encounter: Secondary | ICD-10-CM | POA: Diagnosis present

## 2015-11-30 DIAGNOSIS — R0600 Dyspnea, unspecified: Secondary | ICD-10-CM | POA: Diagnosis not present

## 2015-11-30 DIAGNOSIS — R102 Pelvic and perineal pain: Secondary | ICD-10-CM | POA: Diagnosis present

## 2015-11-30 DIAGNOSIS — K7460 Unspecified cirrhosis of liver: Secondary | ICD-10-CM | POA: Diagnosis not present

## 2015-11-30 DIAGNOSIS — Z7401 Bed confinement status: Secondary | ICD-10-CM | POA: Diagnosis not present

## 2015-11-30 DIAGNOSIS — E1151 Type 2 diabetes mellitus with diabetic peripheral angiopathy without gangrene: Secondary | ICD-10-CM | POA: Diagnosis present

## 2015-11-30 DIAGNOSIS — R188 Other ascites: Secondary | ICD-10-CM | POA: Diagnosis present

## 2015-11-30 DIAGNOSIS — M1990 Unspecified osteoarthritis, unspecified site: Secondary | ICD-10-CM | POA: Diagnosis present

## 2015-11-30 DIAGNOSIS — E785 Hyperlipidemia, unspecified: Secondary | ICD-10-CM | POA: Diagnosis present

## 2015-11-30 LAB — BODY FLUID CELL COUNT WITH DIFFERENTIAL
Eos, Fluid: 0 %
LYMPHS FL: 78 %
MONOCYTE-MACROPHAGE-SEROUS FLUID: 21 %
Neutrophil Count, Fluid: 1 %
Other Cells, Fluid: 0 %
WBC FLUID: 1155 uL

## 2015-11-30 LAB — COMPREHENSIVE METABOLIC PANEL
ALBUMIN: 2.5 g/dL — AB (ref 3.5–5.0)
ALK PHOS: 61 U/L (ref 38–126)
ALT: 9 U/L — ABNORMAL LOW (ref 14–54)
ANION GAP: 4 — AB (ref 5–15)
AST: 12 U/L — ABNORMAL LOW (ref 15–41)
BILIRUBIN TOTAL: 0.4 mg/dL (ref 0.3–1.2)
BUN: 26 mg/dL — AB (ref 6–20)
CALCIUM: 9.6 mg/dL (ref 8.9–10.3)
CO2: 25 mmol/L (ref 22–32)
Chloride: 105 mmol/L (ref 101–111)
Creatinine, Ser: 0.67 mg/dL (ref 0.44–1.00)
GFR calc Af Amer: >60 mL/min (ref >60)
GFR calc non Af Amer: >60 mL/min (ref >60)
GLUCOSE: 116 mg/dL — AB (ref 65–99)
Potassium: 3.9 mmol/L (ref 3.5–5.1)
Sodium: 134 mmol/L — ABNORMAL LOW (ref 135–145)
TOTAL PROTEIN: 6.8 g/dL (ref 6.5–8.1)

## 2015-11-30 LAB — LACTATE DEHYDROGENASE, PLEURAL OR PERITONEAL FLUID: LD, Fluid: 155 U/L — ABNORMAL HIGH (ref 3–23)

## 2015-11-30 LAB — MRSA PCR SCREENING: MRSA by PCR: NEGATIVE

## 2015-11-30 LAB — PROTEIN, BODY FLUID: Total protein, fluid: 3.1 g/dL

## 2015-11-30 LAB — GLUCOSE, SEROUS FLUID: Glucose, Fluid: 111 mg/dL

## 2015-11-30 LAB — GLUCOSE, CAPILLARY
GLUCOSE-CAPILLARY: 164 mg/dL — AB (ref 65–99)
GLUCOSE-CAPILLARY: 111 mg/dL — AB (ref 65–99)

## 2015-11-30 LAB — LACTIC ACID, PLASMA: LACTIC ACID, VENOUS: 1.3 mmol/L (ref 0.5–2.0)

## 2015-11-30 MED ORDER — FOLIC ACID 1 MG PO TABS
1.0000 mg | ORAL_TABLET | Freq: Every day | ORAL | Status: DC
Start: 2015-11-30 — End: 2015-12-06
  Administered 2015-11-30 – 2015-12-06 (×7): 1 mg via ORAL
  Filled 2015-11-30 (×7): qty 1

## 2015-11-30 MED ORDER — VITAMIN A 10000 UNITS PO CAPS: 10000.0000 [IU] | ORAL_CAPSULE | Freq: Every day | ORAL | Status: DC

## 2015-11-30 MED ORDER — DIPHENHYDRAMINE HCL (SLEEP) 25 MG PO TABS: 25.0000 mg | ORAL_TABLET | Freq: Four times a day (QID) | ORAL | Status: DC | PRN

## 2015-11-30 MED ORDER — DIPHENHYDRAMINE HCL 25 MG PO CAPS: 25.0000 mg | ORAL_CAPSULE | Freq: Four times a day (QID) | ORAL | Status: DC | PRN

## 2015-11-30 MED ORDER — ETODOLAC 400 MG PO TABS
400.0000 mg | ORAL_TABLET | Freq: Two times a day (BID) | ORAL | Status: DC
  Administered 2015-12-01 – 2015-12-06 (×11): 400 mg via ORAL
  Filled 2015-11-30 (×18): qty 1

## 2015-11-30 MED ORDER — DILTIAZEM HCL ER 60 MG PO CP12: 120.0000 mg | ORAL_CAPSULE | Freq: Two times a day (BID) | ORAL | Status: DC

## 2015-11-30 MED ORDER — MOMETASONE FURO-FORMOTEROL FUM 100-5 MCG/ACT IN AERO
2.0000 | INHALATION_SPRAY | Freq: Two times a day (BID) | RESPIRATORY_TRACT | Status: DC
  Administered 2015-11-30 – 2015-12-06 (×10): 2 via RESPIRATORY_TRACT
  Filled 2015-11-30 (×3): qty 8.8

## 2015-11-30 MED ORDER — OXYBUTYNIN CHLORIDE 5 MG PO TABS
5.0000 mg | ORAL_TABLET | Freq: Two times a day (BID) | ORAL | Status: DC
  Administered 2015-11-30 – 2015-12-06 (×12): 5 mg via ORAL
  Filled 2015-11-30 (×16): qty 1

## 2015-11-30 MED ORDER — SODIUM CHLORIDE 0.9 % IJ SOLN
3.0000 mL | INTRAMUSCULAR | Status: DC | PRN
  Administered 2015-12-02 – 2015-12-05 (×3): 3 mL via INTRAVENOUS
  Filled 2015-11-30 (×3): qty 10

## 2015-11-30 MED ORDER — CYANOCOBALAMIN 1000 MCG/ML IJ SOLN: 1000.0000 ug | INTRAMUSCULAR | Status: DC

## 2015-11-30 MED ORDER — METFORMIN HCL ER 500 MG PO TB24
500.0000 mg | ORAL_TABLET | Freq: Every day | ORAL | Status: DC
  Administered 2015-12-01 – 2015-12-06 (×5): 500 mg via ORAL
  Filled 2015-11-30 (×8): qty 1

## 2015-11-30 MED ORDER — MAGIC MOUTHWASH
10.0000 mL | Freq: Four times a day (QID) | ORAL | Status: DC | PRN
  Filled 2015-11-30: qty 10

## 2015-11-30 MED ORDER — VITAMIN C 500 MG PO TABS
1000.0000 mg | ORAL_TABLET | Freq: Every day | ORAL | Status: DC
  Administered 2015-11-30 – 2015-12-06 (×7): 1000 mg via ORAL
  Filled 2015-11-30 (×7): qty 2

## 2015-11-30 MED ORDER — POTASSIUM CHLORIDE IN NACL 20-0.9 MEQ/L-% IV SOLN
INTRAVENOUS | Status: DC
  Administered 2015-11-30: 17:00:00 via INTRAVENOUS
  Filled 2015-11-30 (×2): qty 1000

## 2015-11-30 MED ORDER — SODIUM CHLORIDE 0.9 % IV SOLN: 250.0000 mL | INTRAVENOUS | Status: DC | PRN

## 2015-11-30 MED ORDER — ONDANSETRON HCL 4 MG PO TABS
4.0000 mg | ORAL_TABLET | Freq: Four times a day (QID) | ORAL | Status: DC | PRN
  Administered 2015-12-01: 4 mg via ORAL
  Filled 2015-11-30: qty 1

## 2015-11-30 MED ORDER — INSULIN ASPART 100 UNIT/ML ~~LOC~~ SOLN
3.0000 [IU] | Freq: Three times a day (TID) | SUBCUTANEOUS | Status: DC
  Administered 2015-11-30 – 2015-12-06 (×5): 3 [IU] via SUBCUTANEOUS
  Filled 2015-11-30 (×8): qty 3

## 2015-11-30 MED ORDER — FIRST-DUKES MOUTHWASH MT SUSP: Freq: Four times a day (QID) | OROMUCOSAL | Status: DC | PRN

## 2015-11-30 MED ORDER — SODIUM CHLORIDE 0.9 % IJ SOLN
3.0000 mL | Freq: Two times a day (BID) | INTRAMUSCULAR | Status: DC
  Administered 2015-12-01 – 2015-12-06 (×11): 3 mL via INTRAVENOUS

## 2015-11-30 MED ORDER — DM-GUAIFENESIN ER 30-600 MG PO TB12: 1.0000 | ORAL_TABLET | Freq: Every day | ORAL | Status: DC

## 2015-11-30 MED ORDER — ESCITALOPRAM OXALATE 10 MG PO TABS
10.0000 mg | ORAL_TABLET | Freq: Every day | ORAL | Status: DC
  Administered 2015-11-30 – 2015-12-06 (×7): 10 mg via ORAL
  Filled 2015-11-30 (×7): qty 1

## 2015-11-30 MED ORDER — LORAZEPAM 0.5 MG PO TABS
0.5000 mg | ORAL_TABLET | Freq: Three times a day (TID) | ORAL | Status: DC | PRN
  Administered 2015-11-30 – 2015-12-02 (×2): 0.5 mg via ORAL
  Filled 2015-11-30 (×2): qty 1

## 2015-11-30 MED ORDER — PREDNISONE 5 MG PO TABS
7.5000 mg | ORAL_TABLET | Freq: Every day | ORAL | Status: DC
  Administered 2015-11-30 – 2015-12-06 (×7): 7.5 mg via ORAL
  Filled 2015-11-30: qty 1.5
  Filled 2015-11-30 (×7): qty 2

## 2015-11-30 MED ORDER — OMEGA-3-ACID ETHYL ESTERS 1 G PO CAPS
1.0000 g | ORAL_CAPSULE | Freq: Every day | ORAL | Status: DC
  Administered 2015-11-30 – 2015-12-06 (×7): 1 g via ORAL
  Filled 2015-11-30 (×7): qty 1

## 2015-11-30 MED ORDER — INSULIN ASPART 100 UNIT/ML ~~LOC~~ SOLN
0.0000 [IU] | Freq: Three times a day (TID) | SUBCUTANEOUS | Status: DC
  Administered 2015-12-01: 1 [IU] via SUBCUTANEOUS
  Administered 2015-12-04 – 2015-12-05 (×2): 3 [IU] via SUBCUTANEOUS
  Administered 2015-12-05: 1 [IU] via SUBCUTANEOUS
  Administered 2015-12-06: 14:00:00 2 [IU] via SUBCUTANEOUS
  Filled 2015-11-30: qty 2
  Filled 2015-11-30: qty 3
  Filled 2015-11-30: qty 1
  Filled 2015-11-30: qty 3
  Filled 2015-11-30: qty 1

## 2015-11-30 MED ORDER — POTASSIUM CHLORIDE CRYS ER 10 MEQ PO TBCR
10.0000 meq | EXTENDED_RELEASE_TABLET | Freq: Every day | ORAL | Status: DC
  Administered 2015-11-30: 10 meq via ORAL
  Filled 2015-11-30: qty 1

## 2015-11-30 MED ORDER — INSULIN DETEMIR 100 UNIT/ML FLEXPEN: 15.0000 [IU] | Freq: Every day | SUBCUTANEOUS | Status: DC

## 2015-11-30 MED ORDER — PANTOPRAZOLE SODIUM 40 MG PO TBEC
40.0000 mg | DELAYED_RELEASE_TABLET | Freq: Every day | ORAL | Status: DC
  Administered 2015-11-30 – 2015-12-06 (×7): 40 mg via ORAL
  Filled 2015-11-30 (×7): qty 1

## 2015-11-30 MED ORDER — ACETAMINOPHEN 325 MG PO TABS: 650.0000 mg | ORAL_TABLET | Freq: Four times a day (QID) | ORAL | Status: DC | PRN

## 2015-11-30 MED ORDER — SODIUM CHLORIDE 0.9 % IJ SOLN
3.0000 mL | Freq: Two times a day (BID) | INTRAMUSCULAR | Status: DC
  Administered 2015-11-30 (×3): 3 mL via INTRAVENOUS

## 2015-11-30 MED ORDER — ADULT MULTIVITAMIN W/MINERALS CH
1.0000 | ORAL_TABLET | Freq: Every day | ORAL | Status: DC
  Administered 2015-11-30 – 2015-12-06 (×7): 1 via ORAL
  Filled 2015-11-30 (×7): qty 1

## 2015-11-30 MED ORDER — FERROUS SULFATE 325 (65 FE) MG PO TABS
325.0000 mg | ORAL_TABLET | Freq: Three times a day (TID) | ORAL | Status: DC
  Administered 2015-11-30 – 2015-12-06 (×17): 325 mg via ORAL
  Filled 2015-11-30 (×17): qty 1

## 2015-11-30 MED ORDER — FUROSEMIDE 20 MG PO TABS
20.0000 mg | ORAL_TABLET | Freq: Two times a day (BID) | ORAL | Status: DC
  Administered 2015-11-30: 20 mg via ORAL
  Filled 2015-11-30: qty 1

## 2015-11-30 MED ORDER — DILTIAZEM HCL ER 60 MG PO CP12
120.0000 mg | ORAL_CAPSULE | Freq: Two times a day (BID) | ORAL | Status: DC
  Administered 2015-11-30 – 2015-12-06 (×10): 120 mg via ORAL
  Filled 2015-11-30 (×14): qty 2

## 2015-11-30 MED ORDER — ASPIRIN EC 81 MG PO TBEC
81.0000 mg | DELAYED_RELEASE_TABLET | Freq: Every day | ORAL | Status: DC
  Administered 2015-11-30 – 2015-12-06 (×7): 81 mg via ORAL
  Filled 2015-11-30 (×7): qty 1

## 2015-11-30 MED ORDER — CENTRUM SILVER PO CHEW: CHEWABLE_TABLET | Freq: Every day | ORAL | Status: DC

## 2015-11-30 MED ORDER — LORATADINE 10 MG PO TABS: 10.0000 mg | ORAL_TABLET | Freq: Every day | ORAL | Status: DC | PRN

## 2015-11-30 MED ORDER — CETYLPYRIDINIUM CHLORIDE 0.05 % MT LIQD
7.0000 mL | Freq: Two times a day (BID) | OROMUCOSAL | Status: DC
  Administered 2015-12-01 – 2015-12-06 (×10): 7 mL via OROMUCOSAL
  Filled 2015-11-30: qty 7

## 2015-11-30 MED ORDER — PRO-STAT SUGAR FREE PO LIQD
30.0000 mL | Freq: Two times a day (BID) | ORAL | Status: DC
  Administered 2015-11-30 – 2015-12-06 (×8): 30 mL via ORAL

## 2015-11-30 MED ORDER — IPRATROPIUM-ALBUTEROL 0.5-2.5 (3) MG/3ML IN SOLN
3.0000 mL | Freq: Four times a day (QID) | RESPIRATORY_TRACT | Status: DC
  Administered 2015-11-30 – 2015-12-02 (×8): 3 mL via RESPIRATORY_TRACT
  Filled 2015-11-30 (×9): qty 3

## 2015-11-30 MED ORDER — TRAMADOL HCL 50 MG PO TABS
50.0000 mg | ORAL_TABLET | ORAL | Status: DC | PRN
  Administered 2015-11-30 – 2015-12-06 (×9): 50 mg via ORAL
  Filled 2015-11-30 (×9): qty 1

## 2015-11-30 MED ORDER — METOPROLOL TARTRATE 25 MG PO TABS
25.0000 mg | ORAL_TABLET | Freq: Two times a day (BID) | ORAL | Status: DC
  Administered 2015-11-30 – 2015-12-03 (×5): 25 mg via ORAL
  Filled 2015-11-30 (×5): qty 1

## 2015-11-30 MED ORDER — ENOXAPARIN SODIUM 40 MG/0.4ML ~~LOC~~ SOLN
40.0000 mg | SUBCUTANEOUS | Status: DC
  Administered 2015-11-30 – 2015-12-05 (×6): 40 mg via SUBCUTANEOUS
  Filled 2015-11-30 (×6): qty 0.4

## 2015-11-30 MED ORDER — INSULIN GLARGINE 100 UNIT/ML ~~LOC~~ SOLN
15.0000 [IU] | Freq: Every day | SUBCUTANEOUS | Status: DC
  Administered 2015-11-30 – 2015-12-03 (×3): 15 [IU] via SUBCUTANEOUS
  Filled 2015-11-30 (×6): qty 0.15

## 2015-11-30 MED ORDER — GUAIFENESIN ER 600 MG PO TB12: 300.0000 mg | ORAL_TABLET | Freq: Every day | ORAL | Status: DC

## 2015-11-30 MED ORDER — GUAIFENESIN ER 600 MG PO TB12
600.0000 mg | ORAL_TABLET | Freq: Two times a day (BID) | ORAL | Status: DC
  Administered 2015-11-30 – 2015-12-06 (×12): 600 mg via ORAL
  Filled 2015-11-30 (×12): qty 1

## 2015-11-30 NOTE — Procedures (Signed)
Successful [place] RT THORACENTESIS No comp Stable 1.7 L removed Full report in PACS

## 2015-11-30 NOTE — NC FL2 (Signed)
[place] LEVEL OF CARE SCREENING TOOL     IDENTIFICATION  Patient Name: Suzanne Ewing Birthdate: [birth year] Sex: female Admission Date (Current Location): 11/29/2015  [place] and [place] Number:  Engineering geologist and Address:  [facility], [address], [place], [place] [address]      Provider Number: [id number]  Attending Physician Name and Address:  Theodoro Grist, MD  Relative Name and Phone Number:       Current Level of Care: Hospital Recommended Level of Care: [place] Prior Approval Number:    Date Approved/Denied:   PASRR Number:  ([id number])  Discharge Plan: SNF    Current Diagnoses: Patient Active Problem List   Diagnosis Date Noted  . UTI (lower urinary tract infection) 11/30/2015  . Sepsis secondary to UTI ([place]) 06/12/2015  . Cellulitis and abscess 06/12/2015  . Acute renal failure ([place]) 06/12/2015  . Hyperkalemia 06/12/2015  . Anemia 06/12/2015  . CAD (coronary artery disease) 06/12/2015  . DM (diabetes mellitus) ([place]) 06/12/2015  . HTN (hypertension) 06/12/2015  . Rheumatoid arthritis ([place]) 06/12/2015  . GERD (gastroesophageal reflux disease) 06/12/2015  . DM2 (diabetes mellitus, type 2) ([place]) 06/12/2015    Orientation RESPIRATION BLADDER Height & Weight    Self, Place  O2 (Nasal Cannula (2 L/min) ) Continent 5\' 6"  (167.6 cm) 164 lbs.  BEHAVIORAL SYMPTOMS/MOOD NEUROLOGICAL BOWEL NUTRITION STATUS   (None)  (None) Continent Diet (DYS 3)  AMBULATORY STATUS COMMUNICATION OF NEEDS Skin   Extensive Assist Verbally Other (Comment) (Pressure Ulcer Location: Sacrum )                       Personal Care Assistance Level of Assistance  Bathing, Feeding, Dressing Bathing Assistance: Limited assistance Feeding assistance: Independent Dressing Assistance: Limited assistance     Functional Limitations Info  Sight, Hearing, Speech Sight Info: Adequate Hearing Info: Adequate Speech  Info: Adequate    SPECIAL CARE FACTORS FREQUENCY        PT Frequency:  (5)              Contractures      Additional Factors Info  Insulin Sliding Scale, Code Status, Allergies Code Status Info:  (Full Code ) Allergies Info:  (Macrobid Nitrofurantoin Monohyd Macro, Penicillins, Tape, Azithromycin, Clarithromycin, Nitroglycerin, Leflunomide, Morphine, & Naproxen)   Insulin Sliding Scale Info:  (insulin aspart (novoLOG) injection 3 Units- 3 times a day with each mill )       Current Medications (11/30/2015):  This is the current hospital active medication list Current Facility-Administered Medications  Medication Dose Route Frequency Provider Last Rate Last Dose  . 0.9 %  sodium chloride infusion  250 mL Intravenous PRN Pavan Pyreddy, MD      . 0.9 % NaCl with KCl 20 mEq/ L  infusion   Intravenous Continuous Theodoro Grist, MD 50 mL/hr at 11/30/15 1637    . acetaminophen (TYLENOL) tablet 650 mg  650 mg Oral Q6H PRN Pavan Pyreddy, MD      . antiseptic oral rinse (CPC / CETYLPYRIDINIUM CHLORIDE 0.05%) solution 7 mL  7 mL Mouth Rinse q12n4p Theodoro Grist, MD      . aspirin EC tablet 81 mg  81 mg Oral Daily Pavan Pyreddy, MD   81 mg at 11/30/15 1052  . [START ON 12/16/2015] cyanocobalamin ((VITAMIN B-12)) injection 1,000 mcg  1,000 mcg Intramuscular Q30 days Saundra Shelling, MD      . diltiazem (CARDIZEM SR) 12 hr capsule  120 mg  120 mg Oral BID Saundra Shelling, MD   120 mg at 11/30/15 1640  . diphenhydrAMINE (BENADRYL) capsule 25 mg  25 mg Oral Q6H PRN Pavan Pyreddy, MD      . enoxaparin (LOVENOX) injection 40 mg  40 mg Subcutaneous Q24H Pavan Pyreddy, MD      . escitalopram (LEXAPRO) tablet 10 mg  10 mg Oral Daily Pavan Pyreddy, MD   10 mg at 11/30/15 1051  . etodolac (LODINE) tablet 400 mg  400 mg Oral BID Saundra Shelling, MD   400 mg at 11/30/15 1641  . feeding supplement (PRO-STAT SUGAR FREE 64) liquid 30 mL  30 mL Oral BID Saundra Shelling, MD   30 mL at 11/30/15 1641  . ferrous sulfate  tablet 325 mg  325 mg Oral TID Saundra Shelling, MD   325 mg at 11/30/15 1637  . folic acid (FOLVITE) tablet 1 mg  1 mg Oral Daily Pavan Pyreddy, MD   1 mg at 11/30/15 1054  . guaiFENesin (MUCINEX) 12 hr tablet 600 mg  600 mg Oral BID Saundra Shelling, MD   600 mg at 11/30/15 1051  . insulin aspart (novoLOG) injection 0-9 Units  0-9 Units Subcutaneous TID WC Theodoro Grist, MD      . insulin aspart (novoLOG) injection 3 Units  3 Units Subcutaneous TID WC Theodoro Grist, MD   3 Units at 11/30/15 1638  . insulin glargine (LANTUS) injection 15 Units  15 Units Subcutaneous Daily Saundra Shelling, MD   15 Units at 11/30/15 1638  . ipratropium-albuterol (DUONEB) 0.5-2.5 (3) MG/3ML nebulizer solution 3 mL  3 mL Nebulization Q6H Pavan Pyreddy, MD   3 mL at 11/30/15 1419  . levofloxacin (LEVAQUIN) IVPB 750 mg  750 mg Intravenous Q24H Daymon Larsen, MD   Stopped at 11/30/15 0028  . loratadine (CLARITIN) tablet 10 mg  10 mg Oral Daily PRN Saundra Shelling, MD      . LORazepam (ATIVAN) tablet 0.5 mg  0.5 mg Oral Q8H PRN Saundra Shelling, MD   0.5 mg at 11/30/15 1054  . magic mouthwash  10 mL Oral QID PRN Saundra Shelling, MD      . metFORMIN (GLUCOPHAGE-XR) 24 hr tablet 500 mg  500 mg Oral Q breakfast Pavan Pyreddy, MD   500 mg at 11/30/15 0800  . metoprolol tartrate (LOPRESSOR) tablet 25 mg  25 mg Oral BID Saundra Shelling, MD   25 mg at 11/30/15 1052  . mometasone-formoterol (DULERA) 100-5 MCG/ACT inhaler 2 puff  2 puff Inhalation BID Saundra Shelling, MD   2 puff at 11/30/15 0800  . multivitamin with minerals tablet 1 tablet  1 tablet Oral Daily Saundra Shelling, MD   1 tablet at 11/30/15 1052  . omega-3 acid ethyl esters (LOVAZA) capsule 1 g  1 g Oral Daily Pavan Pyreddy, MD   1 g at 11/30/15 1051  . ondansetron (ZOFRAN) tablet 4 mg  4 mg Oral Q6H PRN Saundra Shelling, MD      . oxybutynin (DITROPAN) tablet 5 mg  5 mg Oral BID Saundra Shelling, MD   5 mg at 11/30/15 1051  . pantoprazole (PROTONIX) EC tablet 40 mg  40 mg Oral Daily Saundra Shelling, MD   40 mg at 11/30/15 1052  . predniSONE (DELTASONE) tablet 7.5 mg  7.5 mg Oral Q breakfast Pavan Pyreddy, MD   7.5 mg at 11/30/15 1052  . sodium chloride 0.9 % injection 3 mL  3 mL Intravenous Q12H Saundra Shelling, MD      .  sodium chloride 0.9 % injection 3 mL  3 mL Intravenous Q12H Pavan Pyreddy, MD   3 mL at 11/30/15 1645  . sodium chloride 0.9 % injection 3 mL  3 mL Intravenous PRN Saundra Shelling, MD      . traMADol (ULTRAM) tablet 50 mg  50 mg Oral Q4H PRN Saundra Shelling, MD   50 mg at 11/30/15 1637  . vitamin C (ASCORBIC ACID) tablet 1,000 mg  1,000 mg Oral Daily Saundra Shelling, MD   1,000 mg at 11/30/15 1052     Discharge Medications: Please see discharge summary for a list of discharge medications.  Relevant Imaging Results:  Relevant Lab Results:   Additional Information  (SSN [id number])  Lorenso Quarry Jodeen Mclin, LCSW

## 2015-11-30 NOTE — Clinical Social Work Note (Signed)
Clinical Social Work Assessment  Patient Details  Name: Suzanne Ewing MRN: [id number] Date of Birth: [birth year]  Date of referral:  11/30/15               Reason for consult:  Discharge Planning                Permission sought to share information with:  Family Supports, Facility Contact Representative ([facility], Danton Sewer (Husband), & Suzanne Ewing (Daugher & HPOA) ) Permission granted to share information::  Yes, Verbal Permission Granted  Name::        Agency::   ([facility])  Relationship::   Danton Sewer (Husband), & Suzanne Ewing (Daugher & [place]) )  Contact Information:   Kelcee Bjorn [phone] & Suzanne Ewing (Daugher & [place]) [phone]  Housing/Transportation Living arrangements for the past 2 months:  [place] ([facility]) Source of Information:  Spouse, Adult Children Suzanne Ewing (Husband), & Suzanne Ewing (Daugher & HPOA) ) Patient Interpreter Needed:  None Criminal Activity/Legal Involvement Pertinent to Current Situation/Hospitalization:  No - Comment as needed Significant Relationships:  Adult Children, Spouse Lives with:  Other (Comment), Spouse (Patient was at [facility] for STR) Do you feel safe going back to the place where you live?  Yes Need for family participation in patient care:  Yes (Comment) Danton Sewer (Husband), & Suzanne Ewing (Daugher & [place]) )  Care giving concerns: Patient is from [facility].    Social Worker assessment / plan:  CSW met with patient, spouse Suzanne Ewing) and daughter/ HPOA Suzanne Ewing) [phone] at bedside. Patient was sitting up in bed. Per patient's daughter/ HPOA patient came from [facility]. She reports that patient has used her 100 days at [facility] and has began the [facility] application with [facility]'s staff. She reports that her Department of Social Services Medicaid worker is awaiting some documentation from [facility] to process the application. Per patient's daughter/ HPOA patient can return to [facility] at discharge. She reports that  she's following up with the Medicaid application. Patient's daughter/ HPOA inquired about being able to choose her own MD at [facility]. CSW informed daughter that she'll follow up with her and speak to [facility], admissions coordinator at [facility]. CSW explained Medicare for SNF placement rules once patient's 100 days has been used. This process includes a 60 day wellness period where patient has to be discharged from a facility and doing well at home before patient is readmitted into the hospital. Patient's daughter. HPOA reported she understood the 60 day wellness period.   CSW contacted Broadus John, admissions coordinator at [facility]. Broadus John reports that [facility] can [facility] to [facility] pending bed availability. CSW inquired about daughter/ HPOA's MD question. Broadus John reports that each MD has to be certified by [facility] in order to "round on patients". Broadus John reports that he'll follow with Medicaid application tomorrow.   CSW informed daughter/ HPOA of MD question per Broadus John (mentioned above).   FL2 and PASRR complete. Faxed to [facility] via [facility].   CSW will continue to follow and assist.   Employment status:  Retired Insurance underwriter information:  Medicare, Other (Comment Required) Scientist, clinical (histocompatibility and immunogenetics) ) PT Recommendations:  Not assessed at this time Information / Referral to community resources:  [place] ([facility])  Patient/Family's Response to care:  Patient's daughter is agreeable to patient returning to [facility] at discharge when she's medically stable.   Patient/Family's Understanding of and Emotional Response to Diagnosis, Current Treatment, and Prognosis:  Patient, her husband and daughter/ HPOA were all pleasant. They appreciated assistance  from [place].   Emotional Assessment Appearance:  Appears older than stated age Attitude/Demeanor/Rapport:   (None) Affect (typically observed):  Calm, Anxious Orientation:  Oriented to Self, Oriented to Place Alcohol / Substance use:  Not Applicable Psych involvement (Current and /or in the  community):  No (Comment)  Discharge Needs  Concerns to be addressed:  Discharge Planning Concerns Readmission within the last 30 days:    Current discharge risk:  Chronically ill Barriers to Discharge:  Continued Medical Work up   [employer], LCSW 11/30/2015, 5:05 PM

## 2015-11-30 NOTE — Progress Notes (Signed)
ANTIBIOTIC CONSULT NOTE - INITIAL  Pharmacy Consult for Levaquin dosing Indication: UTI  Allergies  Allergen Reactions  . Macrobid [Nitrofurantoin Monohyd Macro] Itching, Rash and Swelling    PT HOSPITALIZED  . Penicillins Shortness Of Breath, Rash and Other (See Comments)    Has patient had a PCN reaction causing immediate rash, facial/tongue/throat swelling, SOB or lightheadedness with hypotension: Yes Has patient had a PCN reaction causing severe rash involving mucus membranes or skin necrosis: No Has patient had a PCN reaction that required hospitalization Yes Has patient had a PCN reaction occurring within the last 10 years: No If all of the above answers are "NO", then may proceed with Cephalosporin use.   . Tape Rash  . Azithromycin Other (See Comments)    Reaction: unknown   . Clarithromycin Other (See Comments)    Abdominal pain  . Nitroglycerin Other (See Comments)    Reaction - unknown  . Leflunomide Rash  . Morphine Nausea And Vomiting    Nausea and vomiting  . Naproxen Rash    Patient Measurements: Height: 5\' 6"  (167.6 cm) Weight: 164 lb 14.4 oz (74.798 kg) IBW/kg (Calculated) : 59.3 Adjusted Body Weight:   Vital Signs: Temp: 98.3 F (36.8 C) (01/10 0149) Temp Source: Oral (01/10 0149) BP: 125/68 mmHg (01/10 0149) Pulse Rate: 78 (01/10 0149) Intake/Output from previous day:   Intake/Output from this shift:    Labs:  Recent Labs  11/29/15 2119  WBC 4.4  HGB 8.9*  PLT 208  CREATININE 0.62   Estimated Creatinine Clearance: 68.6 mL/min (by C-G formula based on Cr of 0.62). No results for input(s): VANCOTROUGH, VANCOPEAK, VANCORANDOM, GENTTROUGH, GENTPEAK, GENTRANDOM, TOBRATROUGH, TOBRAPEAK, TOBRARND, AMIKACINPEAK, AMIKACINTROU, AMIKACIN in the last 72 hours.   Microbiology: No results found for this or any previous visit (from the past 720 hour(s)).  Medical History: Past Medical History  Diagnosis Date  . Arthritis   . Anemia   . GERD  (gastroesophageal reflux disease)   . Heart disease   . Hypertension   . Nephrolithiasis   . Heel ulcer ([place])   . Diabetes mellitus without complication ([place])   . Hyperlipidemia   . Coronary artery disease     Medications:   Assessment: Blood and urine cx pending UA: LE(+) NO2(+) WBC TNTC CXR: pneumonia not excluded  Goal of Therapy:  Resolution of infection  Plan:  Levaquin 750 mg IV q 24 hours ordered by MD. OK for pt parameters.  Allina Riches S 11/30/2015,2:14 AM

## 2015-11-30 NOTE — Progress Notes (Addendum)
[facility] NAME: Suzanne Ewing    MR#:  [id number]  DATE OF BIRTH:  05/12/1946  SUBJECTIVE:  CHIEF COMPLAINT:   Chief Complaint  Patient presents with  . Fever   the patient is 70 year old female who presents to the hospital with fever and chills, fall smelling urine and drainage around the catheter. Patient also was noted to have right pleural effusion , for which she underwent 1.7 L thoracentesis today 10th of January,  nonhealing ulcer in the left heel, bladder stones. Admits of intermittent dysphagia symptoms. Patient has not been ambulatory for the past 3 years and would like to start walking again. Complains of bilateral flank pains before coming to emergency room  Review of Systems  Constitutional: Positive for malaise/fatigue. Negative for fever, chills and weight loss.  HENT: Negative for congestion.   Eyes: Negative for blurred vision and double vision.  Respiratory: Positive for shortness of breath. Negative for cough, sputum production and wheezing.   Cardiovascular: Negative for chest pain, palpitations, orthopnea, leg swelling and PND.  Gastrointestinal: Negative for nausea, vomiting, abdominal pain, diarrhea, constipation and blood in stool.  Genitourinary: Negative for dysuria, urgency, frequency and hematuria.  Musculoskeletal: Negative for falls.  Neurological: Negative for dizziness, tremors, focal weakness and headaches.  Endo/Heme/Allergies: Does not bruise/bleed easily.  Psychiatric/Behavioral: Negative for depression. The patient does not have insomnia.     VITAL SIGNS: Blood pressure 124/58, pulse 90, temperature 98.2 F (36.8 C), temperature source Oral, resp. rate 24, height 5\' 6"  (1.676 m), weight 74.798 kg (164 lb 14.4 oz), SpO2 97 %.  PHYSICAL EXAMINATION:   GENERAL:  70 y.o.-year-old patient lying in the bed with no acute distress. Intermittently somnolent and review of system as difficult to  obtain, although sometimes patient opens her eyes and converses briefly, answers questions appropriately EYES: Pupils equal, round, reactive to light and accommodation. No scleral icterus. Extraocular muscles intact.  HEENT: Head atraumatic, normocephalic. Oropharynx and nasopharynx clear.  NECK:  Supple, no jugular venous distention. No thyroid enlargement, no tenderness.  LUNGS: Markedly diminished breath sounds on the right, relatively good air entrance on the left, no wheezing, rales,rhonchi or crepitation. No use of accessory muscles of respiration. Patient is positioned on the right side of her chest CARDIOVASCULAR: S1, S2 normal. No murmurs, rubs, or gallops.  ABDOMEN: Soft, mild discomfort in suprapubic area on palpation but no rebound or guarding, nondistended. Bowel sounds present. No organomegaly or mass.  EXTREMITIES: No pedal edema, cyanosis, or clubbing. Left heel ulcer was noted with mild serous drainage, no faul smell noted NEUROLOGIC: Cranial nerves II through XII are intact. Muscle strength 5/5 in all extremities. Sensation intact. Gait not checked.  PSYCHIATRIC: The patient is somnolent, but is able to be awakened with verbal stimuli, oriented x 3.  SKIN: No obvious rash, lesion, or ulcer, except ulceration and left heel. Some cyanotic feet bilaterally with diminished bilateral pulses in the lower extremities. Dressing displaced in the left buttock area, no drainage  ORDERS/RESULTS REVIEWED:   CBC  Recent Labs Lab 11/29/15 2119  WBC 4.4  HGB 8.9*  HCT 28.8*  PLT 208  MCV 76.9*  MCH 23.9*  MCHC 31.0*  RDW 18.6*  LYMPHSABS 0.6*  MONOABS 0.2  EOSABS 0.0  BASOSABS 0.0   ------------------------------------------------------------------------------------------------------------------  Chemistries   Recent Labs Lab 11/29/15 2119 11/30/15 0716  NA 137 134*  K 3.7 3.9  CL 104 105  CO2 26 25  GLUCOSE 115*  116*  BUN 27* 26*  CREATININE 0.62 0.67  CALCIUM 10.2  9.6  AST 14* 12*  ALT 10* 9*  ALKPHOS 65 61  BILITOT 0.6 0.4   ------------------------------------------------------------------------------------------------------------------ estimated creatinine clearance is 68.6 mL/min (by C-G formula based on Cr of 0.67). ------------------------------------------------------------------------------------------------------------------ No results for input(s): TSH, T4TOTAL, T3FREE, THYROIDAB in the last 72 hours.  Invalid input(s): FREET3  Cardiac Enzymes No results for input(s): CKMB, TROPONINI, MYOGLOBIN in the last 168 hours.  Invalid input(s): CK ------------------------------------------------------------------------------------------------------------------ Invalid input(s): POCBNP ---------------------------------------------------------------------------------------------------------------  RADIOLOGY: Dg Chest 1 View  11/30/2015  CLINICAL DATA:  Status post right thoracentesis removing 1.7 L. EXAM: CHEST 1 VIEW COMPARISON:  11/29/2015 FINDINGS: Rotated exam to the right. Near complete resolution of the right effusion following thoracentesis. Trace residual right effusion. Improvement in the right lower lobe aeration. No pneumothorax. Left lung remains clear. Heart is enlarged. Prior median sternotomy noted. IMPRESSION: No pneumothorax following right thoracentesis. Near complete resolution of the right effusion. Improved right base aeration. Electronically Signed   By: Jerilynn Mages.  Shick M.D.   On: 11/30/2015 14:16   Dg Chest 2 View  11/29/2015  CLINICAL DATA:  Dyspnea and fever. EXAM: CHEST  2 VIEW COMPARISON:  06/17/2015 FINDINGS: Patient is rotated limiting assessment. There is significant volume loss in the right hemithorax with hazy opacity throughout right lower lung zone. Patient is post median sternotomy. Cardiomediastinal contours are obscured. Suspect small left pleural effusion. Minimal vascular congestion without overt edema. IMPRESSION:  Rotated exam. Volume loss in the right hemithorax with hazy opacities throughout the right lower lung zone, suspect combination of atelectasis and pleural effusion. Underlying pneumonia not excluded. Small left pleural effusion. Recommend continued radiographic follow-up. Should volume loss persist, ultimately chest CT may be helpful. Electronically Signed   By: Jeb Levering M.D.   On: 11/29/2015 21:56   Ct Renal Stone Study  11/29/2015  CLINICAL DATA:  Pelvic pain in female. EXAM: CT ABDOMEN AND PELVIS WITHOUT CONTRAST TECHNIQUE: Multidetector CT imaging of the abdomen and pelvis was performed following the standard protocol without IV contrast. COMPARISON:  CT scan of August 01, 2007. FINDINGS: Status post surgical posterior fusion of L3, L4 and L5. Large right pleural effusion is noted with right lower lobe atelectasis. Status post cholecystectomy. Hepatic cirrhosis is noted. Splenic granuloma is noted. Pancreas is unremarkable. Stable left adrenal adenoma is noted. Right adrenal gland appears normal. Bilateral nephrolithiasis is noted. No hydronephrosis or renal obstruction is noted. Atherosclerosis of abdominal aorta is noted without aneurysm formation. Stable adenopathy is noted in retroperitoneal and porta hepatis regions. The appendix appears normal. There is no evidence of bowel obstruction. Urinary bladder is decompressed with Foley catheter. Uterine atrophy is noted. Small amount of free fluid is noted in the dependent portion of the pelvis. Continued presence of large hernia anteriorly in the pelvis which contains loops of small bowel without obstruction. Bladder calculi are noted. IMPRESSION: Large right pleural effusion with atelectasis of the right lower lobe. Hepatic cirrhosis is noted. Stable left adrenal adenoma. Atherosclerosis of abdominal aorta without aneurysm formation. Bilateral nephrolithiasis is noted without hydronephrosis or renal obstruction. Urinary bladder is decompressed  secondary to Foley catheter. Multiple bladder calculi are noted. Continued presence of large hernia involving anterior pelvic wall which contains loops of small bowel without obstruction. Electronically Signed   By: Marijo Conception, M.D.   On: 11/29/2015 22:27   US Thoracentesis Asp Pleural Space W/img Guide  11/30/2015  CLINICAL DATA:  Shortness of breath, respiratory difficulty, large right  pleural effusion by CT EXAM: ULTRASOUND GUIDED RIGHT THORACENTESIS COMPARISON:  11/30/2015 PROCEDURE: An ultrasound guided thoracentesis was thoroughly discussed with the patient and questions answered. The benefits, risks, alternatives and complications were also discussed. The patient understands and wishes to proceed with the procedure. Written consent was obtained. Ultrasound was performed to localize and mark an adequate pocket of fluid in the right chest. The area was then prepped and draped in the normal sterile fashion. 1% Lidocaine was used for local anesthesia. Under ultrasound guidance a safety centesis needle catheter was introduced. Thoracentesis was performed. The catheter was removed and a dressing applied. Complications:  None immediate FINDINGS: A total of approximately 1.7 L of clear pleural fluid was removed. A fluid sample wassent for laboratory analysis. IMPRESSION: Successful ultrasound guided right thoracentesis yielding 1.7 L of pleural fluid. Electronically Signed   By: Jerilynn Mages.  Shick M.D.   On: 11/30/2015 13:55    EKG:  Orders placed or performed during the hospital encounter of 11/29/15  . ED EKG 12-Lead  . ED EKG 12-Lead    ASSESSMENT AND PLAN:  Principal Problem:   UTI (lower urinary tract infection) 1. Acute pyelonephritis, urine cultures are pending, blood cultures are negative so far, continue patient on levofloxacin for now adjusting for culture results 2. Peripheral vascular disease, if vascular surgery to see patient in consultation 3. Left heel ulcer, getting podiatry and  vascular surgery consultation 4 . Hyponatremia, initiate patient on IV fluids, follow sodium level tomorrow morning. Hold Lasix as well as potassium supplements for now 5. Bladder stones, get urology consultation 6. Right pleural effusion, status post paracentesis, Jerry 10th 2017, pleural fluid analysis is still pending  7. Dysphagia , initiate patient on dysphagia 3 diet , his speech therapist involved    Management plans discussed with the patient, family and they are in agreement.   DRUG ALLERGIES:  Allergies  Allergen Reactions  . Macrobid [Nitrofurantoin Monohyd Macro] Itching, Rash and Swelling    PT HOSPITALIZED  . Penicillins Shortness Of Breath, Rash and Other (See Comments)    Has patient had a PCN reaction causing immediate rash, facial/tongue/throat swelling, SOB or lightheadedness with hypotension: Yes Has patient had a PCN reaction causing severe rash involving mucus membranes or skin necrosis: No Has patient had a PCN reaction that required hospitalization Yes Has patient had a PCN reaction occurring within the last 10 years: No If all of the above answers are "NO", then may proceed with Cephalosporin use.   . Tape Rash  . Azithromycin Other (See Comments)    Reaction: unknown   . Clarithromycin Other (See Comments)    Abdominal pain  . Nitroglycerin Other (See Comments)    Reaction - unknown  . Leflunomide Rash  . Morphine Nausea And Vomiting    Nausea and vomiting  . Naproxen Rash    CODE STATUS:     Code Status Orders        Start     Ordered   11/30/15 0310  Full code   Continuous     11/30/15 0309    Code Status History    Date Active Date Inactive Code Status Order ID Comments User Context   06/12/2015  3:47 AM 06/18/2015  5:34 PM Full Code [id number]  Juluis Mire, MD Inpatient      TOTAL CRITICAL CARE TIME TAKING CARE OF THIS PATIENT: 60 minutes.    prolonged discussion with patient's family about her condition, treatment plan and  discharge planning time spent approximately  30 minutes. On discussions alone  Deneisha Dade M.D on 11/30/2015 at 3:15 PM  Between 7am to 6pm - Pager - [phone]  After 6pm go to [email or web address] - password [email or web address] [facility]  Office  [phone]  CC: Primary care physician; Juluis Pitch, MD

## 2015-11-30 NOTE — H&P (Signed)
[facility] NAME: Suzanne Ewing    MR#:  [id number]  DATE OF BIRTH:  August 19, 1946  DATE OF ADMISSION:  11/29/2015  PRIMARY CARE PHYSICIAN: Juluis Pitch, MD   REQUESTING/REFERRING PHYSICIAN:   CHIEF COMPLAINT:   Chief Complaint  Patient presents with  . Fever    HISTORY OF PRESENT ILLNESS: Suzanne Ewing  is a 70 y.o. female with a known history of arthritis, recurrent urinary tract infections, chronic indwelling Foley catheter, diabetes mellitus, decubitus ulcer, heel ulcer, coronary artery disease, hyperlipidemia [facility]. Patient has chronic indwelling Foley catheter. Catheter was changed yesterday after she had some foul-smelling urine and drainage around the catheter. Since yesterday evening patient had some low-grade fever and chills. Patient was managed for urinary tract infection in the past and received IV antibiotics. According to the family member patient has history of bilateral kidney stones and they were concerned that this was causing the infection. Patient has nonhealing ulcer in the left heel also has skin breakdown in the left malleolus. Patient gets wound care at the nursing home. She is on oxygen at the nursing home and nasal cannula. No history of chest pain. No complaints of any shortness of breath. Generalized weakness fatigue.  PAST MEDICAL HISTORY:   Past Medical History  Diagnosis Date  . Arthritis   . Anemia   . GERD (gastroesophageal reflux disease)   . Heart disease   . Hypertension   . Nephrolithiasis   . Heel ulcer ([place])   . Diabetes mellitus without complication ([place])   . Hyperlipidemia   . Coronary artery disease     PAST SURGICAL HISTORY: Past Surgical History  Procedure Laterality Date  . Coronary artery bypass graft    . Back surgery    . Cholecystectomy    . Shoulder surgery    . Knee surgery Left   . Carpal tunnel release Bilateral    . Joint replacement      SOCIAL HISTORY:  Social History  Substance Use Topics  . Smoking status: Former Smoker    Quit date: 06/03/2014  . Smokeless tobacco: Never Used  . Alcohol Use: No    FAMILY HISTORY:  Family History  Problem Relation Age of Onset  . Stroke Mother   . Diabetes type II Sister   . Hypertension Sister     DRUG ALLERGIES:  Allergies  Allergen Reactions  . Macrobid [Nitrofurantoin Monohyd Macro] Itching, Rash and Swelling    PT HOSPITALIZED  . Penicillins Shortness Of Breath, Rash and Other (See Comments)    Has patient had a PCN reaction causing immediate rash, facial/tongue/throat swelling, SOB or lightheadedness with hypotension: Yes Has patient had a PCN reaction causing severe rash involving mucus membranes or skin necrosis: No Has patient had a PCN reaction that required hospitalization Yes Has patient had a PCN reaction occurring within the last 10 years: No If all of the above answers are "NO", then may proceed with Cephalosporin use.   . Tape Rash  . Azithromycin Other (See Comments)    Reaction: unknown   . Clarithromycin Other (See Comments)    Abdominal pain  . Nitroglycerin Other (See Comments)    Reaction - unknown  . Leflunomide Rash  . Morphine Nausea And Vomiting    Nausea and vomiting  . Naproxen Rash    REVIEW OF SYSTEMS:   CONSTITUTIONAL: No fever, fatigue or weakness.  EYES: No blurred  or double vision.  EARS, NOSE, AND THROAT: No tinnitus or ear pain.  RESPIRATORY: No cough, shortness of breath, wheezing or hemoptysis.  CARDIOVASCULAR: No chest pain, orthopnea, edema.  GASTROINTESTINAL: No nausea, vomiting, diarrhea or abdominal pain.  GENITOURINARY: No dysuria, hematuria.  ENDOCRINE: No polyuria, nocturia,  HEMATOLOGY: No anemia, easy bruising or bleeding SKIN: No rash or lesion. MUSCULOSKELETAL: No joint pain or arthritis.   NEUROLOGIC: No tingling, numbness, weakness.  PSYCHIATRY: No anxiety or depression.    MEDICATIONS AT HOME:  Prior to Admission medications   Medication Sig Start Date End Date Taking? Authorizing Provider  acetaminophen (TYLENOL) 325 MG tablet Take 2 tablets (650 mg total) by mouth every 6 (six) hours as needed for mild pain (or Fever >/= 101). Patient taking differently: Take 650 mg by mouth every 6 (six) hours as needed for mild pain or headache.  06/18/15  Yes Glendon Axe, MD  alum & mag hydroxide-simeth (GERI-LANTA) 200-200-20 MG/5ML suspension Take 30 mLs by mouth every 4 (four) hours as needed for indigestion or heartburn.   Yes Historical Provider, MD  Amino Acids-Protein Hydrolys (FEEDING SUPPLEMENT, PRO-STAT SUGAR FREE 64,) LIQD Take 30 mLs by mouth 2 (two) times daily.   Yes Historical Provider, MD  ascorbic acid (VITAMIN C) 1000 MG tablet Take 1,000 mg by mouth daily.   Yes Historical Provider, MD  cyanocobalamin (,VITAMIN B-12,) 1000 MCG/ML injection Inject 1,000 mcg into the muscle every 30 (thirty) days. Given on the 26th day of the month   Yes Historical Provider, MD  dextromethorphan-guaiFENesin (MUCINEX DM) 30-600 MG 12hr tablet Take 1 tablet by mouth at bedtime.   Yes Historical Provider, MD  diltiazem (CARDIZEM SR) 120 MG 12 hr capsule Take 120 mg by mouth 2 (two) times daily.   Yes Historical Provider, MD  Diphenhyd-Hydrocort-Nystatin (FIRST-DUKES MOUTHWASH MT) Use as directed 5 mLs in the mouth or throat 4 (four) times daily as needed (candidiasis).   Yes Historical Provider, MD  diphenhydrAMINE (SOMINEX) 25 MG tablet Take 25 mg by mouth every 6 (six) hours as needed for itching or sleep.    Yes Historical Provider, MD  escitalopram (LEXAPRO) 10 MG tablet Take 10 mg by mouth daily.   Yes Historical Provider, MD  etodolac (LODINE) 400 MG tablet Take 400 mg by mouth 2 (two) times daily.   Yes Historical Provider, MD  ferrous sulfate 325 (65 FE) MG tablet Take 325 mg by mouth 3 (three) times daily.    Yes Historical Provider, MD  Fluticasone-Salmeterol (ADVAIR)  100-50 MCG/DOSE AEPB Inhale 1 puff into the lungs 2 (two) times daily.   Yes Historical Provider, MD  folic acid (FOLVITE) 1 MG tablet Take 1 mg by mouth daily.    Yes Historical Provider, MD  furosemide (LASIX) 20 MG tablet Take 20 mg by mouth 2 (two) times daily.   Yes Historical Provider, MD  glucose blood test strip 1 each by Other route 4 (four) times daily -  before meals and at bedtime. Use as instructed   Yes Historical Provider, MD  guaiFENesin (MUCINEX) 600 MG 12 hr tablet Take 300 mg by mouth daily.   Yes Historical Provider, MD  ibandronate (BONIVA) 3 MG/3ML SOLN injection Inject 3 mg into the vein every 3 (three) months. Taken on the 12th day of the month   Yes Historical Provider, MD  insulin detemir (LEVEMIR) 100 unit/ml SOLN Inject 15 Units into the skin daily.   Yes Historical Provider, MD  loperamide (IMODIUM) 2 MG capsule Take 1  capsule (2 mg total) by mouth every 6 (six) hours as needed for diarrhea or loose stools. 06/18/15  Yes Glendon Axe, MD  loratadine (CLARITIN) 10 MG tablet Take 10 mg by mouth daily as needed for allergies.   Yes Historical Provider, MD  LORazepam (ATIVAN) 0.5 MG tablet Take 0.5 mg by mouth every 8 (eight) hours as needed for anxiety. Take 1/2 to 1(one) tablet by mouth once daily as needed for anxiety per medication list   Yes Historical Provider, MD  metFORMIN (GLUCOPHAGE-XR) 500 MG 24 hr tablet Take 500 mg by mouth daily with breakfast.   Yes Historical Provider, MD  metoprolol tartrate (LOPRESSOR) 25 MG tablet Take 25 mg by mouth 2 (two) times daily.   Yes Historical Provider, MD  Multiple Vitamins-Minerals (CENTRUM SILVER PO) Take 1 tablet by mouth daily.    Yes Historical Provider, MD  Omega-3 Fatty Acids (FISH OIL) 1000 MG CAPS Take 1 capsule by mouth daily.    Yes Historical Provider, MD  omeprazole (PRILOSEC) 20 MG capsule Take 20 mg by mouth 2 (two) times daily.    Yes Historical Provider, MD  ondansetron (ZOFRAN) 4 MG tablet Take 4 mg by mouth  every 6 (six) hours as needed for nausea or vomiting.   Yes Historical Provider, MD  oxybutynin (DITROPAN) 5 MG tablet Take 5 mg by mouth 2 (two) times daily.   Yes Historical Provider, MD  potassium chloride (KLOR-CON M10) 10 MEQ tablet Take 1 tablet (10 mEq total) by mouth daily. Take with Lasix 06/18/15  Yes Glendon Axe, MD  predniSONE (DELTASONE) 2.5 MG tablet Take 7.5 mg by mouth daily with breakfast.   Yes Historical Provider, MD  promethazine (PHENERGAN) 25 MG tablet Take 25 mg by mouth every 6 (six) hours as needed for nausea or vomiting.   Yes Historical Provider, MD  traMADol (ULTRAM) 50 MG tablet Take 50 mg by mouth every 4 (four) hours as needed for moderate pain.  04/23/15  Yes Historical Provider, MD  vitamin A 10000 UNIT capsule Take 10,000 Units by mouth daily.    Yes Historical Provider, MD  aspirin EC 81 MG EC tablet Take 1 tablet (81 mg total) by mouth daily. Patient not taking: Reported on 11/29/2015 06/18/15   Glendon Axe, MD  bismuth subsalicylate (PEPTO BISMOL) 262 MG/15ML suspension Take 30 mLs by mouth every 4 (four) hours as needed for diarrhea or loose stools. Patient not taking: Reported on 11/29/2015 06/18/15   Glendon Axe, MD  collagenase (SANTYL) ointment Apply topically daily. Patient not taking: Reported on 11/29/2015 06/18/15   Glendon Axe, MD  insulin aspart (NOVOLOG) 100 UNIT/ML injection Inject 0-9 Units into the skin 3 (three) times daily with meals. Patient not taking: Reported on 11/29/2015 06/18/15   Glendon Axe, MD  nystatin (MYCOSTATIN) 100000 UNIT/ML suspension Take 5 mLs (500,000 Units total) by mouth 4 (four) times daily. Patient not taking: Reported on 11/29/2015 06/18/15   Glendon Axe, MD  ondansetron [facility]) 4 MG/2ML SOLN injection Inject 2 mLs (4 mg total) into the vein every 6 (six) hours as needed for nausea. Patient not taking: Reported on 11/29/2015 06/18/15   Glendon Axe, MD      PHYSICAL EXAMINATION:   VITAL SIGNS: Blood pressure 115/61, pulse  72, temperature 98.1 F (36.7 C), temperature source Oral, resp. rate 16, height 5\' 6"  (1.676 m), weight 74.798 kg (164 lb 14.4 oz), SpO2 100 %.  GENERAL:  70 y.o.-year-old patient lying in the bed with no acute distress on oxygen  via nasal canula.  EYES: Pupils equal, round, reactive to light and accommodation. No scleral icterus. Extraocular muscles intact.  HEENT: Head atraumatic, normocephalic. Oropharynx and nasopharynx clear.  NECK:  Supple, no jugular venous distention. No thyroid enlargement, no tenderness.  LUNGS: Normal breath sounds bilaterally, no wheezing, rales,rhonchi or crepitation. No use of accessory muscles of respiration.  CARDIOVASCULAR: S1, S2 normal. No murmurs, rubs, or gallops.  ABDOMEN: Soft,obese, nontender, nondistended. Bowel sounds present. No organomegaly or mass.  EXTREMITIES: No pedal edema, cyanosis, or clubbing. Has left heel ulcer,skin break down over the left malleolus. NEUROLOGIC: Cranial nerves II through XII are intact. Muscle strength 5/5 in all extremities. Sensation intact. Gait not checked.  PSYCHIATRIC: The patient is alert and oriented x 3.  SKIN: Left heel ulcer Skin break down ove rleft malleolus Sacral decubitus ulcer.  LABORATORY PANEL:   CBC  Recent Labs Lab 11/29/15 2119  WBC 4.4  HGB 8.9*  HCT 28.8*  PLT 208  MCV 76.9*  MCH 23.9*  MCHC 31.0*  RDW 18.6*  LYMPHSABS 0.6*  MONOABS 0.2  EOSABS 0.0  BASOSABS 0.0   ------------------------------------------------------------------------------------------------------------------  Chemistries   Recent Labs Lab 11/29/15 2119  NA 137  K 3.7  CL 104  CO2 26  GLUCOSE 115*  BUN 27*  CREATININE 0.62  CALCIUM 10.2  AST 14*  ALT 10*  ALKPHOS 65  BILITOT 0.6   ------------------------------------------------------------------------------------------------------------------ estimated creatinine clearance is 68.6 mL/min (by C-G formula based on Cr of  0.62). ------------------------------------------------------------------------------------------------------------------ No results for input(s): TSH, T4TOTAL, T3FREE, THYROIDAB in the last 72 hours.  Invalid input(s): FREET3   Coagulation profile No results for input(s): INR, PROTIME in the last 168 hours. ------------------------------------------------------------------------------------------------------------------- No results for input(s): DDIMER in the last 72 hours. -------------------------------------------------------------------------------------------------------------------  Cardiac Enzymes No results for input(s): CKMB, TROPONINI, MYOGLOBIN in the last 168 hours.  Invalid input(s): CK ------------------------------------------------------------------------------------------------------------------ Invalid input(s): POCBNP  ---------------------------------------------------------------------------------------------------------------  Urinalysis    Component Value Date/Time   COLORURINE YELLOW* 11/29/2015 2119   APPEARANCEUR TURBID* 11/29/2015 2119   LABSPEC 1.013 11/29/2015 2119   PHURINE 7.0 11/29/2015 2119   GLUCOSEU NEGATIVE 11/29/2015 2119   HGBUR 2+* 11/29/2015 2119   BILIRUBINUR NEGATIVE 11/29/2015 2119   [place] NEGATIVE 11/29/2015 2119   PROTEINUR 100* 11/29/2015 2119   NITRITE POSITIVE* 11/29/2015 2119   LEUKOCYTESUR 3+* 11/29/2015 2119     RADIOLOGY: Dg Chest 2 View  11/29/2015  CLINICAL DATA:  Dyspnea and fever. EXAM: CHEST  2 VIEW COMPARISON:  06/17/2015 FINDINGS: Patient is rotated limiting assessment. There is significant volume loss in the right hemithorax with hazy opacity throughout right lower lung zone. Patient is post median sternotomy. Cardiomediastinal contours are obscured. Suspect small left pleural effusion. Minimal vascular congestion without overt edema. IMPRESSION: Rotated exam. Volume loss in the right hemithorax with hazy  opacities throughout the right lower lung zone, suspect combination of atelectasis and pleural effusion. Underlying pneumonia not excluded. Small left pleural effusion. Recommend continued radiographic follow-up. Should volume loss persist, ultimately chest CT may be helpful. Electronically Signed   By: Jeb Levering M.D.   On: 11/29/2015 21:56   Ct Renal Stone Study  11/29/2015  CLINICAL DATA:  Pelvic pain in female. EXAM: CT ABDOMEN AND PELVIS WITHOUT CONTRAST TECHNIQUE: Multidetector CT imaging of the abdomen and pelvis was performed following the standard protocol without IV contrast. COMPARISON:  CT scan of August 01, 2007. FINDINGS: Status post surgical posterior fusion of L3, L4 and L5. Large right pleural effusion is noted with right lower  lobe atelectasis. Status post cholecystectomy. Hepatic cirrhosis is noted. Splenic granuloma is noted. Pancreas is unremarkable. Stable left adrenal adenoma is noted. Right adrenal gland appears normal. Bilateral nephrolithiasis is noted. No hydronephrosis or renal obstruction is noted. Atherosclerosis of abdominal aorta is noted without aneurysm formation. Stable adenopathy is noted in retroperitoneal and porta hepatis regions. The appendix appears normal. There is no evidence of bowel obstruction. Urinary bladder is decompressed with Foley catheter. Uterine atrophy is noted. Small amount of free fluid is noted in the dependent portion of the pelvis. Continued presence of large hernia anteriorly in the pelvis which contains loops of small bowel without obstruction. Bladder calculi are noted. IMPRESSION: Large right pleural effusion with atelectasis of the right lower lobe. Hepatic cirrhosis is noted. Stable left adrenal adenoma. Atherosclerosis of abdominal aorta without aneurysm formation. Bilateral nephrolithiasis is noted without hydronephrosis or renal obstruction. Urinary bladder is decompressed secondary to Foley catheter. Multiple bladder calculi are noted.  Continued presence of large hernia involving anterior pelvic wall which contains loops of small bowel without obstruction. Electronically Signed   By: Marijo Conception, M.D.   On: 11/29/2015 22:27    EKG: Orders placed or performed during the hospital encounter of 11/29/15  . ED EKG 12-Lead  . ED EKG 12-Lead    IMPRESSION AND PLAN: 70 year old female patient with history of nephrolithiasis, diabetes mellitus, heel ulcer, decubitus ulcer, chronic indwelling Foley catheter, recurrent urinary tract infection presented to the ER with fever and chills. Admitting diagnosis 1. Recurrent urinary tract infection 2. Chronic heel ulcer 3. Sacral decubitus ulcer 4. Right lung atelectasis with pleural effusion 5. Bilateral nephrolithiasis and bladder calculi without obstruction Treatment plan Admit patient to medical floor Patient on IV Levaquin antibiotic Follow-up urine culture Wound care consultation for a heel ulcer and decubitus ulcer Continue Lasix for diuresis Supportive care.  All the records are reviewed and case discussed with ED provider. Management plans discussed with the patient, family and they are in agreement.  CODE STATUS:FULL    TOTAL TIME TAKING CARE OF THIS PATIENT: 50 minutes.    Saundra Shelling M.D on 11/30/2015 at 1:17 AM  Between 7am to 6pm - Pager - [phone]  After 6pm go to [email or web address] - password [email or web address] [facility]  Office  [phone]  CC: Primary care physician; Juluis Pitch, MD

## 2015-11-30 NOTE — Consult Note (Addendum)
[facility] Vascular Consult Note  MRN : [id number]  Suzanne Ewing is a 70 y.o. (06/10/1946) female who presents with chief complaint of  Chief Complaint  Patient presents with  . Fever  .  History of Present Illness: The patient is a 70 y.o. female with a known history of arthritis, recurrent urinary tract infections, chronic indwelling Foley catheter, diabetes mellitus, decubitus ulcer, heel ulcer, coronary artery disease, hyperlipidemia [facility] and chills. Patient has chronic indwelling Foley catheter. Catheter was changed yesterday after she had some foul-smelling urine and drainage around the catheter. Since yesterday evening patient had some low-grade fever and chills. Patient was managed for urinary tract infection in the past and received IV antibiotics. According to the history obtained from the chart the patient has history of bilateral kidney stones and they were concerned that this was causing the infection.  On admission she was noted to have a wound on the left heel. Review of history notes this is a chronic nonhealing ulcer in the left heel also has skin breakdown in the left malleolus. Patient gets wound care at the nursing home. She has been followed by Dr. Vickki Muff for her wounds. There is no past history of vascular interventions or atherosclerotic occlusive disease  Current Facility-Administered Medications  Medication Dose Route Frequency Provider Last Rate Last Dose  . 0.9 %  sodium chloride infusion  250 mL Intravenous PRN Saundra Shelling, MD      . acetaminophen (TYLENOL) tablet 650 mg  650 mg Oral Q6H PRN Pavan Pyreddy, MD      . antiseptic oral rinse (CPC / CETYLPYRIDINIUM CHLORIDE 0.05%) solution 7 mL  7 mL Mouth Rinse q12n4p Theodoro Grist, MD   7 mL at 11/30/15 1600  . aspirin EC tablet 81 mg  81 mg Oral Daily Pavan Pyreddy, MD   81 mg at 11/30/15 1052  . [START ON 12/16/2015] cyanocobalamin ((VITAMIN  B-12)) injection 1,000 mcg  1,000 mcg Intramuscular Q30 days Saundra Shelling, MD      . diltiazem (CARDIZEM SR) 12 hr capsule 120 mg  120 mg Oral BID Saundra Shelling, MD   120 mg at 11/30/15 1640  . diphenhydrAMINE (BENADRYL) capsule 25 mg  25 mg Oral Q6H PRN Pavan Pyreddy, MD      . enoxaparin (LOVENOX) injection 40 mg  40 mg Subcutaneous Q24H Pavan Pyreddy, MD      . escitalopram (LEXAPRO) tablet 10 mg  10 mg Oral Daily Pavan Pyreddy, MD   10 mg at 11/30/15 1051  . etodolac (LODINE) tablet 400 mg  400 mg Oral BID Saundra Shelling, MD   400 mg at 11/30/15 1641  . feeding supplement (PRO-STAT SUGAR FREE 64) liquid 30 mL  30 mL Oral BID Saundra Shelling, MD   30 mL at 11/30/15 1641  . ferrous sulfate tablet 325 mg  325 mg Oral TID Saundra Shelling, MD   325 mg at 11/30/15 1637  . folic acid (FOLVITE) tablet 1 mg  1 mg Oral Daily Pavan Pyreddy, MD   1 mg at 11/30/15 1054  . guaiFENesin (MUCINEX) 12 hr tablet 600 mg  600 mg Oral BID Saundra Shelling, MD   600 mg at 11/30/15 1051  . insulin aspart (novoLOG) injection 0-9 Units  0-9 Units Subcutaneous TID WC Theodoro Grist, MD      . insulin aspart (novoLOG) injection 3 Units  3 Units Subcutaneous TID WC Theodoro Grist, MD   3 Units at 11/30/15  1638  . insulin glargine (LANTUS) injection 15 Units  15 Units Subcutaneous Daily Saundra Shelling, MD   15 Units at 11/30/15 1638  . ipratropium-albuterol (DUONEB) 0.5-2.5 (3) MG/3ML nebulizer solution 3 mL  3 mL Nebulization Q6H Pavan Pyreddy, MD   3 mL at 11/30/15 1419  . levofloxacin (LEVAQUIN) IVPB 750 mg  750 mg Intravenous Q24H Daymon Larsen, MD   Stopped at 11/30/15 0028  . loratadine (CLARITIN) tablet 10 mg  10 mg Oral Daily PRN Saundra Shelling, MD      . LORazepam (ATIVAN) tablet 0.5 mg  0.5 mg Oral Q8H PRN Saundra Shelling, MD   0.5 mg at 11/30/15 1054  . magic mouthwash  10 mL Oral QID PRN Saundra Shelling, MD      . metFORMIN (GLUCOPHAGE-XR) 24 hr tablet 500 mg  500 mg Oral Q breakfast Pavan Pyreddy, MD   500 mg at 11/30/15  0800  . metoprolol tartrate (LOPRESSOR) tablet 25 mg  25 mg Oral BID Saundra Shelling, MD   25 mg at 11/30/15 1052  . mometasone-formoterol (DULERA) 100-5 MCG/ACT inhaler 2 puff  2 puff Inhalation BID Saundra Shelling, MD   2 puff at 11/30/15 0800  . multivitamin with minerals tablet 1 tablet  1 tablet Oral Daily Saundra Shelling, MD   1 tablet at 11/30/15 1052  . omega-3 acid ethyl esters (LOVAZA) capsule 1 g  1 g Oral Daily Pavan Pyreddy, MD   1 g at 11/30/15 1051  . ondansetron (ZOFRAN) tablet 4 mg  4 mg Oral Q6H PRN Saundra Shelling, MD      . oxybutynin (DITROPAN) tablet 5 mg  5 mg Oral BID Saundra Shelling, MD   5 mg at 11/30/15 1051  . pantoprazole (PROTONIX) EC tablet 40 mg  40 mg Oral Daily Saundra Shelling, MD   40 mg at 11/30/15 1052  . predniSONE (DELTASONE) tablet 7.5 mg  7.5 mg Oral Q breakfast Pavan Pyreddy, MD   7.5 mg at 11/30/15 1052  . sodium chloride 0.9 % injection 3 mL  3 mL Intravenous Q12H Pavan Pyreddy, MD      . sodium chloride 0.9 % injection 3 mL  3 mL Intravenous Q12H Pavan Pyreddy, MD   3 mL at 11/30/15 1645  . sodium chloride 0.9 % injection 3 mL  3 mL Intravenous PRN Saundra Shelling, MD      . traMADol (ULTRAM) tablet 50 mg  50 mg Oral Q4H PRN Saundra Shelling, MD   50 mg at 11/30/15 1637  . vitamin C (ASCORBIC ACID) tablet 1,000 mg  1,000 mg Oral Daily Saundra Shelling, MD   1,000 mg at 11/30/15 1052    Past Medical History  Diagnosis Date  . Arthritis   . Anemia   . GERD (gastroesophageal reflux disease)   . Heart disease   . Hypertension   . Nephrolithiasis   . Heel ulcer ([place])   . Diabetes mellitus without complication ([place])   . Hyperlipidemia   . Coronary artery disease     Past Surgical History  Procedure Laterality Date  . Coronary artery bypass graft    . Back surgery    . Cholecystectomy    . Shoulder surgery    . Knee surgery Left   . Carpal tunnel release Bilateral   . Joint replacement      Social History Social History  Substance Use Topics  . Smoking  status: Former Smoker    Quit date: 06/03/2014  . Smokeless tobacco: Never Used  .  Alcohol Use: No    Family History Family History  Problem Relation Age of Onset  . Stroke Mother   . Diabetes type II Sister   . Hypertension Sister    no family history of porphyria, bleeding clotting disorders  Allergies  Allergen Reactions  . Macrobid [Nitrofurantoin Monohyd Macro] Itching, Rash and Swelling    PT HOSPITALIZED  . Penicillins Shortness Of Breath, Rash and Other (See Comments)    Has patient had a PCN reaction causing immediate rash, facial/tongue/throat swelling, SOB or lightheadedness with hypotension: Yes Has patient had a PCN reaction causing severe rash involving mucus membranes or skin necrosis: No Has patient had a PCN reaction that required hospitalization Yes Has patient had a PCN reaction occurring within the last 10 years: No If all of the above answers are "NO", then may proceed with Cephalosporin use.   . Tape Rash  . Azithromycin Other (See Comments)    Reaction: unknown   . Clarithromycin Other (See Comments)    Abdominal pain  . Nitroglycerin Other (See Comments)    Reaction - unknown  . Leflunomide Rash  . Morphine Nausea And Vomiting    Nausea and vomiting  . Naproxen Rash     REVIEW OF SYSTEMS (Negative unless checked)  Constitutional: [] Weight loss  [x] Fever  [x] Chills Cardiac: [] Chest pain   [] Chest pressure   [] Palpitations   [x] Shortness of breath when laying flat   [x] Shortness of breath at rest   [] Shortness of breath with exertion. Vascular:  [] Pain in legs with walking   [] Pain in legs at rest   [] Pain in legs when laying flat   [] Claudication   [] Pain in feet when walking  [] Pain in feet at rest  [] Pain in feet when laying flat   [] History of DVT   [] Phlebitis   [] Swelling in legs   [] Varicose veins   [x] Non-healing ulcers Pulmonary:   [x] Uses home oxygen   [x] Productive cough   [] Hemoptysis   [x] Wheeze  [] COPD   [] Asthma Neurologic:   [] Dizziness  [] Blackouts   [] Seizures   [] History of stroke   [] History of TIA  [] Aphasia   [] Temporary blindness   [] Dysphagia   [] Weakness or numbness in arms   [] Weakness or numbness in legs Musculoskeletal:  [] Arthritis   [] Joint swelling   [x] Joint pain   [] Low back pain Hematologic:  [] Easy bruising  [] Easy bleeding   [] Hypercoagulable state   [] Anemic  [] Hepatitis Gastrointestinal:  [] Blood in stool   [] Vomiting blood  [] Gastroesophageal reflux/heartburn   [] Difficulty swallowing. Genitourinary:  [x] Chronic kidney disease   [x] Difficult urination  [] Frequent urination  [] Burning with urination   [] Blood in urine Skin:  [] Rashes   [x] Ulcers   [] Wounds Psychological:  [] History of anxiety   []  History of major depression.  Physical Examination  Filed Vitals:   11/30/15 1222 11/30/15 1330 11/30/15 1348 11/30/15 1423  BP: 121/59 141/67 124/58   Pulse: 80 93 90   Temp: 98.2 F (36.8 C)     TempSrc: Oral     Resp: 17 20 24    Height:      Weight:      SpO2: 91% 98% 96% 97%   Body mass index is 26.63 kg/(m^2). Gen:  WD/WN, NAD Head: [place]/AT, No temporalis wasting. Prominent temp pulse not noted. Ear/Nose/Throat: Hearing grossly intact, nares w/o erythema or drainage, oropharynx w/o Erythema/Exudate Eyes: PERRLA, EOMI.  Neck: Supple, no nuchal rigidity.  No bruit or JVD.  Pulmonary:  Good air movement, clear to auscultation  bilaterally.  Cardiac: RRR, normal S1, S2, no Murmurs, rubs or gallops. Vascular: Small non-draining ulcer on the plantar surface of the heel there is no drainage or odor at present there is no surrounding erythema or induration. There is a dry scab-like lesion over the medial malleolus again no surrounding erythema no drainage no odor Vessel Right Left  Radial Palpable Palpable  Ulnar Palpable Palpable  Brachial Palpable Palpable  Carotid Palpable, without bruit Palpable, without bruit  Aorta Not palpable N/A  Femoral Palpable Palpable  Popliteal Palpable  Palpable  PT  1+ Palpable  1+ Palpable  DP  not Palpable  not Palpable   Gastrointestinal: soft, non-tender/non-distended. No guarding/reflex. No masses, surgical incisions, or scars. Musculoskeletal: M/S 4/5 throughout.  Extremities cool with mild cyanosis of the toes bilaterally.  No deformity or atrophy. No edema. Neurologic: CN 2-12 intact. Pain and light touch intact in extremities.   Speech is not fluent and she cannot finish his sentences because of her shortness of breath. Motor exam as listed above. Psychiatric: Judgment intact, Mood & affect appropriate for pt's clinical situation. Dermatologic: No rashes or ulcers noted.  No cellulitis or open wounds. Lymph : No Cervical, Axillary, or Inguinal lymphadenopathy.    CBC Lab Results  Component Value Date   WBC 4.4 11/29/2015   HGB 8.9* 11/29/2015   HCT 28.8* 11/29/2015   MCV 76.9* 11/29/2015   PLT 208 11/29/2015    BMET    Component Value Date/Time   NA 134* 11/30/2015 0716   NA 140 05/31/2014 0358   K 3.9 11/30/2015 0716   K 4.0 05/31/2014 0358   CL 105 11/30/2015 0716   CL 104 05/31/2014 0358   CO2 25 11/30/2015 0716   CO2 29 05/31/2014 0358   GLUCOSE 116* 11/30/2015 0716   GLUCOSE 166* 05/31/2014 0358   BUN 26* 11/30/2015 0716   BUN 21* 05/31/2014 0358   CREATININE 0.67 11/30/2015 0716   CREATININE 0.90 06/04/2014 0730   CALCIUM 9.6 11/30/2015 0716   CALCIUM 8.6 05/31/2014 0358   GFRNONAA >60 11/30/2015 0716   GFRNONAA >60 06/04/2014 0730   GFRAA >60 11/30/2015 0716   GFRAA >60 06/04/2014 0730   Estimated Creatinine Clearance: 68.6 mL/min (by C-G formula based on Cr of 0.67).  COAG No results found for: INR, PROTIME  Radiology Dg Chest 1 View  11/30/2015  CLINICAL DATA:  Status post right thoracentesis removing 1.7 L. EXAM: CHEST 1 VIEW COMPARISON:  11/29/2015 FINDINGS: Rotated exam to the right. Near complete resolution of the right effusion following thoracentesis. Trace residual right effusion.  Improvement in the right lower lobe aeration. No pneumothorax. Left lung remains clear. Heart is enlarged. Prior median sternotomy noted. IMPRESSION: No pneumothorax following right thoracentesis. Near complete resolution of the right effusion. Improved right base aeration. Electronically Signed   By: Jerilynn Mages.  Shick M.D.   On: 11/30/2015 14:16   Dg Chest 2 View  11/29/2015  CLINICAL DATA:  Dyspnea and fever. EXAM: CHEST  2 VIEW COMPARISON:  06/17/2015 FINDINGS: Patient is rotated limiting assessment. There is significant volume loss in the right hemithorax with hazy opacity throughout right lower lung zone. Patient is post median sternotomy. Cardiomediastinal contours are obscured. Suspect small left pleural effusion. Minimal vascular congestion without overt edema. IMPRESSION: Rotated exam. Volume loss in the right hemithorax with hazy opacities throughout the right lower lung zone, suspect combination of atelectasis and pleural effusion. Underlying pneumonia not excluded. Small left pleural effusion. Recommend continued radiographic follow-up. Should volume loss persist,  ultimately chest CT may be helpful. Electronically Signed   By: Jeb Levering M.D.   On: 11/29/2015 21:56   Dg Chest Port 1 View  11/30/2015  CLINICAL DATA:  Increased dyspnea after large volume right thoracentesis earlier today. EXAM: PORTABLE CHEST 1 VIEW COMPARISON:  Film at 1358 hours immediately following right thoracentesis. FINDINGS: New opacity in the right lower lung as well as vascular congestion is felt to be a combination of atelectasis and also potentially some re-expansion edema in the right lung after thoracentesis. Some component of reaccumulation of pleural fluid cannot be excluded by x-ray. There is no evidence of pneumothorax. The left lung is clear. The heart size is stable. IMPRESSION: In the interval since the immediate post thoracentesis chest x-ray, there is new consolidative density in the right lower lung as well as  some suggestion of asymmetric vascular congestion. This may be a combination of atelectasis and also potentially re-expansion edema of the right lung. Component of pleural fluid reaccumulation cannot be excluded, but would not be expected to be significant in a relatively short time interval. Electronically Signed   By: Aletta Edouard M.D.   On: 11/30/2015 18:16   Ct Renal Stone Study  11/29/2015  CLINICAL DATA:  Pelvic pain in female. EXAM: CT ABDOMEN AND PELVIS WITHOUT CONTRAST TECHNIQUE: Multidetector CT imaging of the abdomen and pelvis was performed following the standard protocol without IV contrast. COMPARISON:  CT scan of August 01, 2007. FINDINGS: Status post surgical posterior fusion of L3, L4 and L5. Large right pleural effusion is noted with right lower lobe atelectasis. Status post cholecystectomy. Hepatic cirrhosis is noted. Splenic granuloma is noted. Pancreas is unremarkable. Stable left adrenal adenoma is noted. Right adrenal gland appears normal. Bilateral nephrolithiasis is noted. No hydronephrosis or renal obstruction is noted. Atherosclerosis of abdominal aorta is noted without aneurysm formation. Stable adenopathy is noted in retroperitoneal and porta hepatis regions. The appendix appears normal. There is no evidence of bowel obstruction. Urinary bladder is decompressed with Foley catheter. Uterine atrophy is noted. Small amount of free fluid is noted in the dependent portion of the pelvis. Continued presence of large hernia anteriorly in the pelvis which contains loops of small bowel without obstruction. Bladder calculi are noted. IMPRESSION: Large right pleural effusion with atelectasis of the right lower lobe. Hepatic cirrhosis is noted. Stable left adrenal adenoma. Atherosclerosis of abdominal aorta without aneurysm formation. Bilateral nephrolithiasis is noted without hydronephrosis or renal obstruction. Urinary bladder is decompressed secondary to Foley catheter. Multiple bladder  calculi are noted. Continued presence of large hernia involving anterior pelvic wall which contains loops of small bowel without obstruction. Electronically Signed   By: Marijo Conception, M.D.   On: 11/29/2015 22:27   US Thoracentesis Asp Pleural Space W/img Guide  11/30/2015  CLINICAL DATA:  Shortness of breath, respiratory difficulty, large right pleural effusion by CT EXAM: ULTRASOUND GUIDED RIGHT THORACENTESIS COMPARISON:  11/30/2015 PROCEDURE: An ultrasound guided thoracentesis was thoroughly discussed with the patient and questions answered. The benefits, risks, alternatives and complications were also discussed. The patient understands and wishes to proceed with the procedure. Written consent was obtained. Ultrasound was performed to localize and mark an adequate pocket of fluid in the right chest. The area was then prepped and draped in the normal sterile fashion. 1% Lidocaine was used for local anesthesia. Under ultrasound guidance a safety centesis needle catheter was introduced. Thoracentesis was performed. The catheter was removed and a dressing applied. Complications:  None immediate FINDINGS: A total  of approximately 1.7 L of clear pleural fluid was removed. A fluid sample wassent for laboratory analysis. IMPRESSION: Successful ultrasound guided right thoracentesis yielding 1.7 L of pleural fluid. Electronically Signed   By: Jerilynn Mages.  Shick M.D.   On: 11/30/2015 13:55    Assessment/Plan 1. Chronic heel ulcer: By my exam she has a palpable posterior tibial pulse. Overall her systemic comorbidities appear to be worsening which is likely responsible for the coolness of her forefoot. Given the situation further vascular workup will be deferred until her comorbidities are more stable  2.Recurrent urinary tract infection this could be the source of her fever and is being addressed. 3. Congestive heart failure she is currently being evaluated by medicine and if her condition deteriorates further will likely  need ICU 4. Right lung atelectasis with pleural effusion plan per medical team 5. Bilateral nephrolithiasis and bladder calculi without obstruction plan per urology   Delana Meyer, Dolores Lory, MD  11/30/2015 7:16 PM

## 2015-11-30 NOTE — Progress Notes (Signed)
PT Cancellation Note  Patient Details Name: Suzanne Ewing MRN: [id number] DOB: [birth year]   Cancelled Treatment:    Reason Eval/Treat Not Completed: Medical issues which prohibited therapy (Consult received and chart reviewed.  Patient noted with order for bedrest x2 hours (starting 11/29/14 at 1352) status post paracentesis.  Will hold therapy at this time and re-attempt at later time/date as medically appropriate.)   Kameryn Tisdel H. Owens Shark, PT, DPT, NCS 11/30/2015, 2:33 PM [phone]

## 2015-11-30 NOTE — Consult Note (Signed)
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  CAD, S/P CABG.  (+) pedal pulses.  Stage 3 pressure injury, present on admission.  Left buttocks.  Family at bedside and indicates there has been a surgery to the ulcer in this area.  Patient has a diverting colostomy to promote healing.  Wound type: Neuropathic ulcer to left foot Pressure ulcer to left buttocks.  Pressure Ulcer POA: Yes Measurement:Left heel 0.5 cm x 0.5 cm x 1.2 cm  Left medial malleolus 1 cm x 1 cm x 0.1 cm Left buttocks 12 cm x 4 cm x 3 cm 100% pale pink and nongranulating  Drainage (amount, consistency, odor) Moderate serosanguious Periwound:Intact Dressing procedure/placement/frequency:Avoid disposable underpads and briefs while in bed.  Dermatherapy therapeutic linen should be touching skin.  Cleanse left buttocks wound with NS and pat dry.  Gently fill with Aquacel Ag silver hydrofiber.  Cover with 4x4 gauze and ABD pad with tape.   Change Mon/Wed/Fri and PRN soilage.   Cleanse left heel ulcer with NS.  Gently fill wound depth with packing strip.  Cover with 2x2 and tape.  Change daily.  Cleanse left medial malleolus with NS and pat gently dry.  Apply silicone border foam dressing.  Change every 3 days and PRN soilage.  Will not follow at this time.  Please re-consult if needed.  Domenic Moras RN BSN [place] Pager [phone]

## 2015-11-30 NOTE — Progress Notes (Signed)
Per MD order a [employer] "Low Boy with Alternate Mattress" was ordered through [employer].    Confirmation:  [id number]   ETA 2-4 hours d/t to inclement weather.

## 2015-11-30 NOTE — Progress Notes (Signed)
Initial Nutrition Assessment      INTERVENTION:  Meals and snacks: Cater to pt preferences  Medical Nutrition Supplement therapy: agree with prostat for added protein.  Per husband does not like ensure/boost supplements   NUTRITION DIAGNOSIS:   Inadequate oral intake related to acute illness as evidenced by per patient/family report.    GOAL:   Patient will meet greater than or equal to 90% of their needs    MONITOR:    (Energy intake, Digestive system)  REASON FOR ASSESSMENT:    (pressure ulcer stage II)    ASSESSMENT:      Pt admitted with UTI.  Having thorocentesis this pm and out of room  Past Medical History  Diagnosis Date  . Arthritis   . Anemia   . GERD (gastroesophageal reflux disease)   . Heart disease   . Hypertension   . Nephrolithiasis   . Heel ulcer ([place])   . Diabetes mellitus without complication ([place])   . Hyperlipidemia   . Coronary artery disease     Current Nutrition: per husband eating few bites during admission  Food/Nutrition-Related History: per husband decreased intake for the past week (bites)   Scheduled Medications:  . antiseptic oral rinse  7 mL Mouth Rinse q12n4p  . aspirin EC  81 mg Oral Daily  . [START ON 12/16/2015] cyanocobalamin  1,000 mcg Intramuscular Q30 days  . diltiazem  120 mg Oral BID  . enoxaparin (LOVENOX) injection  40 mg Subcutaneous Q24H  . escitalopram  10 mg Oral Daily  . etodolac  400 mg Oral BID  . feeding supplement (PRO-STAT SUGAR FREE 64)  30 mL Oral BID  . ferrous sulfate  325 mg Oral TID  . folic acid  1 mg Oral Daily  . furosemide  20 mg Oral BID  . guaiFENesin  600 mg Oral BID  . insulin glargine  15 Units Subcutaneous Daily  . ipratropium-albuterol  3 mL Nebulization Q6H  . levofloxacin (LEVAQUIN) IV  750 mg Intravenous Q24H  . metFORMIN  500 mg Oral Q breakfast  . metoprolol tartrate  25 mg Oral BID  . mometasone-formoterol  2 puff Inhalation BID  . multivitamin with minerals  1 tablet  Oral Daily  . omega-3 acid ethyl esters  1 g Oral Daily  . oxybutynin  5 mg Oral BID  . pantoprazole  40 mg Oral Daily  . potassium chloride  10 mEq Oral Daily  . predniSONE  7.5 mg Oral Q breakfast  . sodium chloride  3 mL Intravenous Q12H  . sodium chloride  3 mL Intravenous Q12H  . ascorbic acid  1,000 mg Oral Daily       Electrolyte/Renal Profile and Glucose Profile:   Recent Labs Lab 11/29/15 2119 11/30/15 0716  NA 137 134*  K 3.7 3.9  CL 104 105  CO2 26 25  BUN 27* 26*  CREATININE 0.62 0.67  CALCIUM 10.2 9.6  GLUCOSE 115* 116*   Protein Profile:  Recent Labs Lab 11/29/15 2119 11/30/15 0716  ALBUMIN 2.8* 2.5*    Gastrointestinal Profile: Last BM:   Nutrition-Focused Physical Exam Findings:  Unable to complete Nutrition-Focused physical exam at this time.     Weight Change: husband unsure of wt loss.  Noted per wt encounters 9% wt loss in the last 7 months     Diet Order:  DIET DYS 3 Room service appropriate?: Yes; Fluid consistency:: Thin  Skin:   pressure ulcer noted stage III   Height:   Ht  Readings from Last 1 Encounters:  11/29/15 5\' 6"  (1.676 m)    Weight:   Wt Readings from Last 1 Encounters:  11/29/15 164 lb 14.4 oz (74.798 kg)    Ideal Body Weight:     BMI:  Body mass index is 26.63 kg/(m^2).  Estimated Nutritional Needs:   Kcal:  BEE 1281 kcals (IF 1.0-1.3, AF 1.2) [id number] kcals.d.   Protein:  (1.0-1.2 g/kg) 74-89 g/d  Fluid:  (25-48ml/kg) 1850-2249ml/d  EDUCATION NEEDS:   No education needs identified at this time  HIGH Care Level  Mcarthur Ivins B. Zenia Resides, [place], [place] (pager) Weekend/On-Call pager [phone])

## 2015-11-30 NOTE — Progress Notes (Signed)
Received call from telemetry about pt HR. Assessed pt and notifed MD that pt became tachy with HR 110. Pt RR also increased to 28 and lung sounds very congested. Sat pt up right. Stopped IV fluids. New orders received. Continue to assess.

## 2015-11-30 NOTE — Evaluation (Signed)
Clinical/Bedside Swallow Evaluation Patient Details  Name: Suzanne Ewing MRN: [id number] Date of Birth: 05/23/1946  Today's Date: 11/30/2015 Time: SLP Start Time (ACUTE ONLY): [id number] SLP Stop Time (ACUTE ONLY): 1615 SLP Time Calculation (min) (ACUTE ONLY): 60 min  Past Medical History:  Past Medical History  Diagnosis Date  . Arthritis   . Anemia   . GERD (gastroesophageal reflux disease)   . Heart disease   . Hypertension   . Nephrolithiasis   . Heel ulcer ([place])   . Diabetes mellitus without complication ([place])   . Hyperlipidemia   . Coronary artery disease    Past Surgical History:  Past Surgical History  Procedure Laterality Date  . Coronary artery bypass graft    . Back surgery    . Cholecystectomy    . Shoulder surgery    . Knee surgery Left   . Carpal tunnel release Bilateral   . Joint replacement     HPI:  Pt is a 70 y.o. female with a known history of GERD, HTN, arthritis, recurrent urinary tract infections, chronic indwelling Foley catheter, diabetes mellitus, decubitus ulcer, heel ulcer, coronary artery disease, hyperlipidemia [facility]. Patient has chronic indwelling Foley catheter. Catheter was changed yesterday after she had some foul-smelling urine and drainage around the catheter. Since yesterday evening patient had some low-grade fever and chills. Patient was managed for urinary tract infection in the past and received IV antibiotics. According to the family member patient has history of bilateral kidney stones and they were concerned that this was causing the infection. Patient has nonhealing ulcer in the left heel also has skin breakdown in the left malleolus. Patient gets wound care at the nursing home. She is on oxygen at the nursing home and nasal cannula. No history of chest pain. No complaints of any shortness of breath. Generalized weakness fatigue. Family present and denied any s/s of dysphagia at the  facility; pt's husband stated pt has not taken much po's in the past week. Pt appears fatigued w/ any exertion and has reduced volume of speech d/t decreased effort during verbal communication. Pt followed instructions given min. cues; appeared drowsy and stated she was "tired". Currently, she is on a dsy. 3 diet.    Assessment / Plan / Recommendation Clinical Impression  Pt appears to adequately tolerate trials of thin liquids via straw and solids(broken down) w/ no immediate, overt s/s of aspiration noted; no significant decline in respiratory status or change in vocal quality during trials, however, post each trial pt was given time for rest to avoid SOB - pt is baseline SOB w/exertion and on O2 support per Dtr. No signficant oral phase deficits noted; appropriate bolus management but min. extra time to fully clear. Pt was able to feed self holding cup but required assistance to support d/t weight; feeding assist given w/ foods d/t weakness. Pt appears at reduced risk for aspiration from an oropharyngeal standpoint, however, pt does have min. increased risk for aspiration if she becomes too SOB w/ the exertion during eating/drinking of po's. Thoroughly discussed aspiration precautions, reflux precautions, and options for meals/foods for easier intake to reduce exertion chewing/mastication; use of condiments. F/u w/ Dietician for nutritional support and diet options. Pt and family agreed. Rec. continue w/ current diet w/ general precautions; rec. meds in Puree for easier swallowing. NSG updated. ST will be available if any change in status. Pt and family agreed.     Aspiration Risk   (  reduced-mild risk)    Diet Recommendation  Dys. 3 w/ thin liquids; aspiration and Reflux precautions; Rest Breaks during meals to avoid exertion and SOB/WOB. Moistened foods of preference.  Medication Administration: Whole meds with puree (as nec.)    Other  Recommendations Recommended Consults: Consider GI evaluation  (for GERD management; Dietician) Oral Care Recommendations: Oral care BID;Staff/trained caregiver to provide oral care   Follow up Recommendations   (TBD)    Frequency and Duration min 2x/week  1 week       Prognosis Prognosis for Safe Diet Advancement: Fair (-good) Barriers to Reach Goals: Severity of deficits      Swallow Study   General Date of Onset: 11/29/15 HPI: Pt is a 70 y.o. female with a known history of GERD, HTN, arthritis, recurrent urinary tract infections, chronic indwelling Foley catheter, diabetes mellitus, decubitus ulcer, heel ulcer, coronary artery disease, hyperlipidemia [facility]. Patient has chronic indwelling Foley catheter. Catheter was changed yesterday after she had some foul-smelling urine and drainage around the catheter. Since yesterday evening patient had some low-grade fever and chills. Patient was managed for urinary tract infection in the past and received IV antibiotics. According to the family member patient has history of bilateral kidney stones and they were concerned that this was causing the infection. Patient has nonhealing ulcer in the left heel also has skin breakdown in the left malleolus. Patient gets wound care at the nursing home. She is on oxygen at the nursing home and nasal cannula. No history of chest pain. No complaints of any shortness of breath. Generalized weakness fatigue. Family present and denied any s/s of dysphagia at the facility; pt's husband stated pt has not taken much po's in the past week. Pt appears fatigued w/ any exertion and has reduced volume of speech d/t decreased effort during verbal communication. Pt followed instructions given min. cues; appeared drowsy and stated she was "tired". Currently, she is on a dsy. 3 diet.  Type of Study: Bedside Swallow Evaluation Previous Swallow Assessment: none Diet Prior to this Study: Regular;Thin liquids (not eating in past week per  report) Temperature Spikes Noted: No (wbc 4.4) Respiratory Status: Nasal cannula (2 liters baseline) History of Recent Intubation: No Behavior/Cognition: Cooperative;Pleasant mood;Requires cueing (drowsy) Oral Cavity Assessment: Dry Oral Care Completed by SLP: Recent completion by staff Oral Cavity - Dentition: Dentures, top (native bottom dentition) Vision: Functional for self-feeding Self-Feeding Abilities: Able to feed self;Able to feed self with adaptive devices;Needs assist;Needs set up Patient Positioning: Upright in bed (kyphosis) Baseline Vocal Quality: Low vocal intensity Volitional Cough: Strong (clearing phlegm) Volitional Swallow: Able to elicit    Oral/Motor/Sensory Function Overall Oral Motor/Sensory Function: Within functional limits   Ice Chips Ice chips: Within functional limits Presentation: Spoon (fed; 2 trials)   Thin Liquid Thin Liquid: Within functional limits Presentation: Self Fed;Straw (assisted to hold cup; multiple sips(~3-4 ozs)) Other Comments: pt has her own drink cup w/ straw    Nectar Thick Nectar Thick Liquid: Not tested   Honey Thick Honey Thick Liquid: Not tested   Puree Puree: Within functional limits Presentation: Spoon (fed; 3 trials)   Solid   GO    Solid: Within functional limits (moistened, broken down food) Presentation: Spoon (3 trials) Other Comments: min. increased time for full mastication and clearing       Orinda Kenner, MS, CCC-SLP  [facility] 11/30/2015,4:50 PM

## 2015-11-30 NOTE — Consult Note (Signed)
Pt well known to me. Asked to see in regards to heel ulcer. Vascular evaluation has been performed. Left heel ulcer is without drainage or obvious infection.   Skin dry.  This is a very chronic ulcer and is currently stable. Would recommend continued pressure reducing bandages and bedding. No need for further treatment other than local wound monitoring.  If shows signs of infection can re-evaluate.

## 2015-12-01 ENCOUNTER — Inpatient Hospital Stay
Admit: 2015-12-01 | Discharge: 2015-12-01 | Disposition: A | Discharge status: Home / Self Care | Payer: Medicare Other | Attending: Internal Medicine | Admitting: Internal Medicine

## 2015-12-01 ENCOUNTER — Inpatient Hospital Stay: Payer: Medicare Other

## 2015-12-01 LAB — GLUCOSE, CAPILLARY
GLUCOSE-CAPILLARY: 138 mg/dL — AB (ref 65–99)
GLUCOSE-CAPILLARY: 70 mg/dL (ref 65–99)
Glucose-Capillary: 110 mg/dL — ABNORMAL HIGH (ref 65–99)
Glucose-Capillary: 116 mg/dL — ABNORMAL HIGH (ref 65–99)

## 2015-12-01 LAB — BLOOD GAS, ARTERIAL
ACID-BASE DEFICIT: 1 mmol/L (ref 0.0–2.0)
ALLENS TEST (PASS/FAIL): POSITIVE — AB
Acid-base deficit: 4.2 mmol/L — ABNORMAL HIGH (ref 0.0–2.0)
Allens test (pass/fail): POSITIVE — AB
BICARBONATE: 24.2 meq/L (ref 21.0–28.0)
Bicarbonate: 26.2 mEq/L (ref 21.0–28.0)
DELIVERY SYSTEMS: POSITIVE
EXPIRATORY PAP: 5
FIO2: 0.28
FIO2: 0.35
INSPIRATORY PAP: 10
MECHANICAL RATE: 10
O2 Saturation: 79.2 %
O2 Saturation: 93.7 %
PATIENT TEMPERATURE: 37
PH ART: 7.2 — AB (ref 7.350–7.450)
PH ART: 7.27 — AB (ref 7.350–7.450)
PO2 ART: 54 mmHg — AB (ref 83.0–108.0)
Patient temperature: 37
RATE: 10 resp/min
pCO2 arterial: 62 mmHg — ABNORMAL HIGH (ref 32.0–48.0)
pCO2 arterial: 57 mmHg — ABNORMAL HIGH (ref 32.0–48.0)
pO2, Arterial: 79 mmHg — ABNORMAL LOW (ref 83.0–108.0)

## 2015-12-01 LAB — SODIUM: Sodium: 134 mmol/L — ABNORMAL LOW (ref 135–145)

## 2015-12-01 LAB — AMMONIA: AMMONIA: 20 umol/L (ref 9–35)

## 2015-12-01 LAB — HEMOGLOBIN A1C: HEMOGLOBIN A1C: 6.3 % — AB (ref 4.0–6.0)

## 2015-12-01 MED ORDER — FUROSEMIDE 20 MG PO TABS
20.0000 mg | ORAL_TABLET | Freq: Two times a day (BID) | ORAL | Status: DC
  Administered 2015-12-01 – 2015-12-06 (×11): 20 mg via ORAL
  Filled 2015-12-01 (×11): qty 1

## 2015-12-01 MED ORDER — POTASSIUM CHLORIDE 20 MEQ PO PACK
20.0000 meq | PACK | Freq: Two times a day (BID) | ORAL | Status: DC
  Administered 2015-12-01 – 2015-12-03 (×4): 20 meq via ORAL
  Filled 2015-12-01 (×4): qty 1

## 2015-12-01 MED ORDER — GENTAMICIN SULFATE 40 MG/ML IJ SOLN
5.0000 mg/kg | INTRAVENOUS | Status: DC
  Administered 2015-12-01: 370 mg via INTRAVENOUS
  Filled 2015-12-01: qty 9.25

## 2015-12-01 MED ORDER — SENNA 8.6 MG PO TABS: 1.0000 | ORAL_TABLET | Freq: Every day | ORAL | Status: DC | PRN

## 2015-12-01 MED ORDER — SODIUM CHLORIDE 0.9 % IV SOLN
1.0000 g | Freq: Three times a day (TID) | INTRAVENOUS | Status: DC
  Administered 2015-12-01 – 2015-12-06 (×15): 1 g via INTRAVENOUS
  Filled 2015-12-01 (×18): qty 1

## 2015-12-01 NOTE — Consult Note (Signed)
Urology Consult  I have been asked to see the patient by Suzanne Ewing, for evaluation and management of UTI/ bladder stones/ renal stones.  Chief Complaint: UTI/ bladder stones/ renal stones   History of Present Illness: Suzanne Ewing is a 70 y.o. year old with multiple medical problems including RA, diabetes, CAD, chronic decubitus/ heel ulcers, CAD, O2 dependent and chronic indwelling Foley catheter who was admitted to the hospital with low-grade fevers, chills, foul-smelling urine.  She was also having some leakage around her Foley catheter. Her catheter was changed on 11/29/15 (20Fr).    She was also noted to have a right pleural effusion which was tapped with 1.7 L of fluid yesterday.  She has also been seen by vascular surgery and podiatry for a nonhealing left heel ulcer.  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  She had a colostomy and Foley placed during this time to allow for wound healing.  Her Foley has been in place since, changed monthly.    Urine culture from admission previously grown gram-negative rods. Speciation and sensitivity pending.  Blood cultures are pending but negative to date.  She is currently on Levaquin which was started on admission.    Suzanne Ewing is a patient of SuzanneStoioff of [facility] last seen in his clinic approximately 1 year ago. She was followed for chronic bilateral nephrolithiasis. Review of previous KUB images show that her upper tract stones are relatively stable in size times many years. Her bladder stones appear to be new.  Prior to Foley placement, she did have full bladder control but had difficulty toileting due to immobilization.    Per the patient, she has had recurrent "UTIs" since the Foley was placed and treated with multiple rounds of abx.  Her symptoms include mostly foul smelling and cloudy urine, chills, and lower abdominal pressure.  Only historical UCx for  review was 05/2015, E. Coli resistant to ceftriaxone, bactrim, Levaquin (ESBL)  She currently lives at [facility].  Her husband is present today to provide additional information. Her PCP is Suzanne Ewing.       Past Medical History  Diagnosis Date  . Arthritis   . Anemia   . GERD (gastroesophageal reflux disease)   . Heart disease   . Hypertension   . Nephrolithiasis   . Heel ulcer ([place])   . Diabetes mellitus without complication ([place])   . Hyperlipidemia   . Coronary artery disease     Past Surgical History  Procedure Laterality Date  . Coronary artery bypass graft    . Back surgery    . Cholecystectomy    . Shoulder surgery    . Knee surgery Left   . Carpal tunnel release Bilateral   . Joint replacement      Home Medications:    Medication List    ASK your doctor about these medications        acetaminophen 325 MG tablet  Commonly known as:  TYLENOL  Take 2 tablets (650 mg total) by mouth every 6 (six) hours as needed for mild pain (or Fever >/= 101).     ascorbic acid 1000 MG tablet  Commonly known as:  VITAMIN C  Take 1,000 mg by mouth daily.     aspirin 81 MG EC tablet  Take 1 tablet (81 mg total) by mouth daily.     bismuth subsalicylate [id number] suspension  Commonly known as:  PEPTO BISMOL  Take 30 mLs by mouth every 4 (four) hours as needed for diarrhea or loose stools.     CENTRUM SILVER PO  Take 1 tablet by mouth daily.     collagenase ointment  Commonly known as:  SANTYL  Apply topically daily.     cyanocobalamin 1000 MCG/ML injection  Commonly known as:  (VITAMIN B-12)  Inject 1,000 mcg into the muscle every 30 (thirty) days. Given on the 26th day of the month     dextromethorphan-guaiFENesin 30-600 MG 12hr tablet  Commonly known as:  MUCINEX DM  Take 1 tablet by mouth at bedtime.     diltiazem 120 MG 12 hr capsule  Commonly known as:  CARDIZEM SR  Take 120 mg by mouth 2 (two) times daily.     diphenhydrAMINE 25 MG tablet    Commonly known as:  SOMINEX  Take 25 mg by mouth every 6 (six) hours as needed for itching or sleep.     escitalopram 10 MG tablet  Commonly known as:  LEXAPRO  Take 10 mg by mouth daily.     etodolac 400 MG tablet  Commonly known as:  LODINE  Take 400 mg by mouth 2 (two) times daily.     feeding supplement (PRO-STAT SUGAR FREE 64) Liqd  Take 30 mLs by mouth 2 (two) times daily.     ferrous sulfate 325 (65 FE) MG tablet  Take 325 mg by mouth 3 (three) times daily.     FIRST-DUKES MOUTHWASH MT  Use as directed 5 mLs in the mouth or throat 4 (four) times daily as needed (candidiasis).     Fish Oil 1000 MG Caps  Take 1 capsule by mouth daily.     Fluticasone-Salmeterol 100-50 MCG/DOSE Aepb  Commonly known as:  ADVAIR  Inhale 1 puff into the lungs 2 (two) times daily.     folic acid 1 MG tablet  Commonly known as:  FOLVITE  Take 1 mg by mouth daily.     furosemide 20 MG tablet  Commonly known as:  LASIX  Take 20 mg by mouth 2 (two) times daily.     GERI-LANTA 200-200-20 MG/5ML suspension  Generic drug:  alum & mag hydroxide-simeth  Take 30 mLs by mouth every 4 (four) hours as needed for indigestion or heartburn.     glucose blood test strip  1 each by Other route 4 (four) times daily -  before meals and at bedtime. Use as instructed     guaiFENesin 600 MG 12 hr tablet  Commonly known as:  MUCINEX  Take 300 mg by mouth daily.     ibandronate 3 MG/3ML Soln injection  Commonly known as:  BONIVA  Inject 3 mg into the vein every 3 (three) months. Taken on the 12th day of the month     insulin aspart 100 UNIT/ML injection  Commonly known as:  novoLOG  Inject 0-9 Units into the skin 3 (three) times daily with meals.     insulin detemir 100 unit/ml Soln  Commonly known as:  LEVEMIR  Inject 15 Units into the skin daily.     loperamide 2 MG capsule  Commonly known as:  IMODIUM  Take 1 capsule (2 mg total) by mouth every 6 (six) hours as needed for diarrhea or loose  stools.     loratadine 10 MG tablet  Commonly known as:  CLARITIN  Take 10 mg by mouth daily as needed for allergies.     LORazepam 0.5 MG  tablet  Commonly known as:  ATIVAN  Take 0.5 mg by mouth every 8 (eight) hours as needed for anxiety. Take 1/2 to 1(one) tablet by mouth once daily as needed for anxiety per medication list     metFORMIN 500 MG 24 hr tablet  Commonly known as:  GLUCOPHAGE-XR  Take 500 mg by mouth daily with breakfast.     metoprolol tartrate 25 MG tablet  Commonly known as:  LOPRESSOR  Take 25 mg by mouth 2 (two) times daily.     nystatin 100000 UNIT/ML suspension  Commonly known as:  MYCOSTATIN  Take 5 mLs (500,000 Units total) by mouth 4 (four) times daily.     omeprazole 20 MG capsule  Commonly known as:  PRILOSEC  Take 20 mg by mouth 2 (two) times daily.     ondansetron 4 MG tablet  Commonly known as:  ZOFRAN  Take 4 mg by mouth every 6 (six) hours as needed for nausea or vomiting.     ondansetron 4 MG/2ML Soln injection  Commonly known as:  ZOFRAN  Inject 2 mLs (4 mg total) into the vein every 6 (six) hours as needed for nausea.     oxybutynin 5 MG tablet  Commonly known as:  DITROPAN  Take 5 mg by mouth 2 (two) times daily.     potassium chloride 10 MEQ tablet  Commonly known as:  KLOR-CON M10  Take 1 tablet (10 mEq total) by mouth daily. Take with Lasix     predniSONE 2.5 MG tablet  Commonly known as:  DELTASONE  Take 7.5 mg by mouth daily with breakfast.     promethazine 25 MG tablet  Commonly known as:  PHENERGAN  Take 25 mg by mouth every 6 (six) hours as needed for nausea or vomiting.     traMADol 50 MG tablet  Commonly known as:  ULTRAM  Take 50 mg by mouth every 4 (four) hours as needed for moderate pain.     vitamin A 10000 UNIT capsule  Take 10,000 Units by mouth daily.        Allergies:  Allergies  Allergen Reactions  . Macrobid [Nitrofurantoin Monohyd Macro] Itching, Rash and Swelling    PT HOSPITALIZED  .  Penicillins Shortness Of Breath, Rash and Other (See Comments)    Has patient had a PCN reaction causing immediate rash, facial/tongue/throat swelling, SOB or lightheadedness with hypotension: Yes Has patient had a PCN reaction causing severe rash involving mucus membranes or skin necrosis: No Has patient had a PCN reaction that required hospitalization Yes Has patient had a PCN reaction occurring within the last 10 years: No If all of the above answers are "NO", then may proceed with Cephalosporin use.   . Tape Rash  . Azithromycin Other (See Comments)    Reaction: unknown   . Clarithromycin Other (See Comments)    Abdominal pain  . Nitroglycerin Other (See Comments)    Reaction - unknown  . Leflunomide Rash  . Morphine Nausea And Vomiting    Nausea and vomiting  . Naproxen Rash    Family History  Problem Relation Age of Onset  . Stroke Mother   . Diabetes type II Sister   . Hypertension Sister     Social History:  reports that she quit smoking about 17 months ago. She has never used smokeless tobacco. She reports that she does not drink alcohol or use illicit drugs.  ROS: A complete review of systems was performed.  All systems are negative  except for pertinent findings as noted.  Physical Exam:  Vital signs in last 24 hours: Temp:  [97.7 F (36.5 C)-99.9 F (37.7 C)] 97.7 F (36.5 C) (01/11 1234) Pulse Rate:  [73-110] 73 (01/11 1234) Resp:  [21-28] 21 (01/10 2059) BP: (112-126)/(53-94) 112/53 mmHg (01/11 1234) SpO2:  [92 %-99 %] 96 % (01/11 1512) Constitutional:  Awake but lethargic at time, No actue distress.  She does answer questions appropriately, ill appearing.   HEENT: [place] AT, moist mucus membranes.  Trachea midline, no masses Cardiovascular: No clubbing, cyanosis, or edema. Respiratory: Normal respiratory effort.  Wearing O2.  GI: Abdomen is soft, nontender, nondistended, no abdominal masses.  Colostomy in place on right abd.  Obese.   GU: No CVA tenderness or  SPT fullness.  No urethral erosion.  20 Fr foley draining cloudy yellow urine.   Skin: LLE wound dressing in place.  Sacral wound not inspected.   Lymph: No cervical or inguinal adenopathy Neurologic: Grossly intact, no focal deficits, moving all 4 extremities MSK: Upper extremity contractures in bilateral hands noted.   Psychiatric: Normal mood and affect   Laboratory Data:   Recent Labs  11/29/15 2119  WBC 4.4  HGB 8.9*  HCT 28.8*    Recent Labs  11/29/15 2119 11/30/15 0716 12/01/15 0635  NA 137 134* 134*  K 3.7 3.9  --   CL 104 105  --   CO2 26 25  --   GLUCOSE 115* 116*  --   BUN 27* 26*  --   CREATININE 0.62 0.67  --   CALCIUM 10.2 9.6  --    No results for input(s): LABPT, INR in the last 72 hours. No results for input(s): LABURIN in the last 72 hours. Results for orders placed or performed during the hospital encounter of 11/29/15  Urine culture     Status: None (Preliminary result)   Collection Time: 11/29/15  9:19 PM  Result Value Ref Range Status   Specimen Description URINE, CLEAN CATCH  Final   Special Requests NONE  Final   Culture   Final    >=100,000 COLONIES/mL GRAM NEGATIVE RODS IDENTIFICATION AND SUSCEPTIBILITIES TO FOLLOW ONCE ISOLATED    Report Status PENDING  Incomplete  Blood Culture (routine x 2)     Status: None (Preliminary result)   Collection Time: 11/29/15  9:19 PM  Result Value Ref Range Status   Specimen Description BLOOD RIGHT ARM  Final   Special Requests BOTTLES DRAWN AEROBIC AND ANAEROBIC 5ML  Final   Culture NO GROWTH < 12 HOURS  Final   Report Status PENDING  Incomplete  Blood Culture (routine x 2)     Status: None (Preliminary result)   Collection Time: 11/29/15 10:01 PM  Result Value Ref Range Status   Specimen Description BLOOD RIGHT ARM  Final   Special Requests BOTTLES DRAWN AEROBIC AND ANAEROBIC 7ML  Final   Culture NO GROWTH < 12 HOURS  Final   Report Status PENDING  Incomplete  MRSA PCR Screening     Status: None     Collection Time: 11/30/15  5:36 AM  Result Value Ref Range Status   MRSA by PCR NEGATIVE NEGATIVE Final    Comment:        The GeneXpert MRSA Assay (FDA approved for NASAL specimens only), is one component of a comprehensive MRSA colonization surveillance program. It is not intended to diagnose MRSA infection nor to guide or monitor treatment for MRSA infections.   Body fluid culture  Status: None (Preliminary result)   Collection Time: 11/30/15  1:40 PM  Result Value Ref Range Status   Specimen Description PLEURAL  Final   Special Requests NONE  Final   Gram Stain PENDING  Incomplete   Culture NO GROWTH < 24 HOURS  Final   Report Status PENDING  Incomplete     Component Value Date/Time   COLORURINE YELLOW* 11/29/2015 2119   APPEARANCEUR TURBID* 11/29/2015 2119   LABSPEC 1.013 11/29/2015 2119   PHURINE 7.0 11/29/2015 2119   GLUCOSEU NEGATIVE 11/29/2015 2119   HGBUR 2+* 11/29/2015 2119   BILIRUBINUR NEGATIVE 11/29/2015 2119   [place] NEGATIVE 11/29/2015 2119   PROTEINUR 100* 11/29/2015 2119   NITRITE POSITIVE* 11/29/2015 2119   LEUKOCYTESUR             Radiologic Imaging: CLINICAL DATA: Pelvic pain in female.  EXAM: CT ABDOMEN AND PELVIS WITHOUT CONTRAST  TECHNIQUE: Multidetector CT imaging of the abdomen and pelvis was performed following the standard protocol without IV contrast.  COMPARISON: CT scan of August 01, 2007.  FINDINGS: Status post surgical posterior fusion of L3, L4 and L5. Large right pleural effusion is noted with right lower lobe atelectasis.  Status post cholecystectomy. Hepatic cirrhosis is noted. Splenic granuloma is noted. Pancreas is unremarkable. Stable left adrenal adenoma is noted. Right adrenal gland appears normal. Bilateral nephrolithiasis is noted. No hydronephrosis or renal obstruction is noted. Atherosclerosis of abdominal aorta is noted without  aneurysm formation. Stable adenopathy is noted in retroperitoneal and porta hepatis regions. The appendix appears normal. There is no evidence of bowel obstruction. Urinary bladder is decompressed with Foley catheter. Uterine atrophy is noted. Small amount of free fluid is noted in the dependent portion of the pelvis. Continued presence of large hernia anteriorly in the pelvis which contains loops of small bowel without obstruction. Bladder calculi are noted.  IMPRESSION: Large right pleural effusion with atelectasis of the right lower lobe.  Hepatic cirrhosis is noted.  Stable left adrenal adenoma.  Atherosclerosis of abdominal aorta without aneurysm formation.  Bilateral nephrolithiasis is noted without hydronephrosis or renal obstruction.  Urinary bladder is decompressed secondary to Foley catheter. Multiple bladder calculi are noted.  Continued presence of large hernia involving anterior pelvic wall which contains loops of small bowel without obstruction.   Electronically Signed  By: Marijo Conception, M.D.  On: 11/29/2015 22:27    Impression/Assessment:   1. Chronic indwelling Foley- placed for wound healing (sacral/ buttock decub)  2. Bladder stones- likely due to Foley encrustation, chronic bacterial colonization  3. Bilateral nephrolithiasis- stable, non obstructing  4. UTI/ sepsis- growing GNR, history of ESWL E. Coli.    Plan:   1. Recommend addition/ change in abx to gentamycin given history of ESBL E. Coli resistant to Levaquin, adjust based on culture and sensitivity data  2.  Ideally, Foley could ultimately be removed if cleared by wound care which will reduce risk of recurrent infections/ bacterial colonization  3. No intervention for kidney stones recommended given stability over many years  4.  Bladder stones will need to be treated but not during this acute illness, if clinically improving, may plan for next week while inpatient vs.  Follow up with myself or Dr. Bernardo Heater per patient preference to arrange for treatment of bladder stones as outpatient.    5. Irrigate Foley as needed if not draining, may become obstructed from stone debris/ material  6.  Plan to see patient again Friday  12/01/2015, 3:37 PM  Hollice Espy,  MD   Thank you for involving me in this patient's care.  Please page with any further questions or concerns.

## 2015-12-01 NOTE — Progress Notes (Signed)
Pt has been reposition multiple time today. Pt still favors the right side while laying down. Will continue to reposition pt.  Suzanne Ewing

## 2015-12-01 NOTE — Evaluation (Signed)
Physical Therapy Evaluation Patient Details Name: Suzanne Ewing MRN: [id number] DOB: 12/25/1945 Today's Date: 12/01/2015   History of Present Illness  presented to ER from STR secondary to chills, fever; admitted with acute pyelonephritis and UTI.  Hospital course significant for R throacentesis (1/10) with removal of 1.7L fluid (with rapid accumulation noted per notes 1/11) and assessment of L heel/lateral malleolar ulcer, recommended for local wound care only by podiatry.  Clinical Impression  Upon evaluation, patient lethargic, but responsive to cuing/stimulation from therapist.  Generally confused with limited ability to consistently follow simple commands (often requiring hand-over-hand from therapist for task initiation).  Demonstrates global weakness and deconditioning throughout all extremities (strength grossly 2+ to 3-/5); noted area of skin impairment L heel/lateral malleolus (covered with dressing).  Tends to laterally flex/rotate towards R in supine, requiring external positioning to promote/maintain midline orientation.  Currently requiring dependent assist +2 for all repositioning and rolling in bed; unable to participate with/tolerate transfer or OOB attempts at this time. Patient with very limited ability to actively participate/progress with skilled PT at this time; appears largely at baseline from chart review and discussion with social work/primary RN.  No acute change in functional ability identified at this time; no skilled PT needs indicated.     Recommend transition to LTC upon discharge as appropriate.  Will re-evaluate should needs and ability to actively participate/progress improve. Of note, attempted to contact facility to verify PLOF, but unable to reach anyone directly associated with her care (x2 attempts).    Follow Up Recommendations  (LTC)    Equipment Recommendations       Recommendations for Other Services       Precautions / Restrictions  Precautions Precautions: Fall Precaution Comments: contact isolation Restrictions Weight Bearing Restrictions: No Other Position/Activity Restrictions: L heel/lateral malleolar ulcer, sacral decub per chart      Mobility  Bed Mobility Overal bed mobility: Needs Assistance;+2 for physical assistance Bed Mobility: Rolling           General bed mobility comments: dep +2 for all rolling, repositioning in bed  Transfers                 General transfer comment: unable to tolerate/participate with transfer attempts at this time  Ambulation/Gait             General Gait Details: unsafe/unable at this time  Stairs            Wheelchair Mobility    Modified Rankin (Stroke Patients Only)       Balance Overall balance assessment:  (in supine, tends to laterall flex/rotate head and torso towards R despite repositioning.  Propped with pillows to promote neutral alignment of head, trunk and pelvis during session.)                                           Pertinent Vitals/Pain Pain Assessment: No/denies pain    Home Living Family/patient expects to be discharged to:: Skilled nursing facility                 Additional Comments: Patient unable to provide clear information this date.  Per chart and discussion with social worker, patient has completed 100 days of Medicare STR; in process of transitioning to LTC at facility.    Prior Function           Comments: Patient unable to  provide accurately, but alludes to use of lift for transfers OOB at facility     Hand Dominance        Extremity/Trunk Assessment   Upper Extremity Assessment: Generalized weakness (grossly 3/5 with assist for initiation)           Lower Extremity Assessment: Generalized weakness (grossly 2+ to 3-/5 throughout hips, knees and ankles; bilat ankle DF to neutral)         Communication   Communication: No difficulties  Cognition  Arousal/Alertness: Lethargic   Overall Cognitive Status: No family/caregiver present to determine baseline cognitive functioning (oriented to self only; inconsistently follows simple commands (25-40%), often requiring hand-over-hand from therapist for task initiation)                      General Comments General comments (skin integrity, edema, etc.): per chart, sacral decubitus and L heel/lateral malleolus ulcer (recommended for local wound care); was receiving wound care at nursing facility    Exercises        Assessment/Plan    PT Assessment Patent does not need any further PT services  PT Diagnosis     PT Problem List    PT Treatment Interventions     PT Goals (Current goals can be found in the Care Plan section) Acute Rehab PT Goals PT Goal Formulation: Patient unable to participate in goal setting    Frequency     Barriers to discharge        Co-evaluation               End of Session   Activity Tolerance: Patient limited by lethargy Patient left: in bed;with call bell/phone within reach;with bed alarm set           Time: [id number] PT Time Calculation (min) (ACUTE ONLY): 13 min   Charges:   PT Evaluation $PT Eval Moderate Complexity: 1 Procedure     PT G Codes:        [name], PT, DPT, NCS 12/01/2015, 2:14 PM [phone]

## 2015-12-01 NOTE — Consult Note (Signed)
Pharmacy Antibiotic Follow-up Note  Suzanne Ewing is a 70 y.o. year-old female admitted on 11/29/2015.  The patient is currently on day 3 of levofloxacin and day 1 gentamicin for UTI.  Assessment/Plan: After discussion with Dr. Verdell Carmine, on call Dr. It was decided to discontinue genamicin and levofloxacin and start meropenem. Patient has a hx of ESBL UTI resistant to levofloxacin. Due to genamicin being nephrotoxic, Dr. Verdell Carmine requested it be discontinued and meropenem started. Patient does have a PNC allergy, however there is a very low cross sensitivity with carbapenems. Also looking through care everywhere, patient has been on ertapenem in the past. Per Dr Verdell Carmine, discontinue vancomycin consult as pt blood bc are negative and urine is growing gram neg rods.  Temp (24hrs), Avg:98.8 F (37.1 C), Min:97.7 F (36.5 C), Max:99.9 F (37.7 C)   Recent Labs Lab 11/29/15 2119  WBC 4.4    Recent Labs Lab 11/29/15 2119 11/30/15 0716  CREATININE 0.62 0.67   Estimated Creatinine Clearance: 68.6 mL/min (by C-G formula based on Cr of 0.67).    Allergies  Allergen Reactions  . Macrobid [Nitrofurantoin Monohyd Macro] Itching, Rash and Swelling    PT HOSPITALIZED  . Penicillins Shortness Of Breath, Rash and Other (See Comments)    Has patient had a PCN reaction causing immediate rash, facial/tongue/throat swelling, SOB or lightheadedness with hypotension: Yes Has patient had a PCN reaction causing severe rash involving mucus membranes or skin necrosis: No Has patient had a PCN reaction that required hospitalization Yes Has patient had a PCN reaction occurring within the last 10 years: No If all of the above answers are "NO", then may proceed with Cephalosporin use.   . Tape Rash  . Azithromycin Other (See Comments)    Reaction: unknown   . Clarithromycin Other (See Comments)    Abdominal pain  . Nitroglycerin Other (See Comments)    Reaction - unknown  . Leflunomide Rash  . Morphine  Nausea And Vomiting    Nausea and vomiting  . Naproxen Rash    Antimicrobials this admission: 1/9 levofloxacin >> 1/11 1/11 gentamicin >> 1/11 1/11 meropenem >>  Levels/dose changes this admission:   Microbiology results: BCx: ngtd  UCx: gram neg rods  MRSA PCR: neg Bodily fluid: ngtd  Thank you for allowing pharmacy to be a part of this patient's care.  Ramond Dial PharmD 12/01/2015 7:03 PM

## 2015-12-01 NOTE — Progress Notes (Addendum)
eLink Physician-Brief Progress Note Patient Name: Suzanne Ewing DOB: 1946-02-16 MRN: [id number]   Date of Service  12/01/2015  HPI/Events of Note  70 yo female with PMH of PVD, L heal ulcer, Hyponatremia, Bladder Stones, R Pleural Effusion, Dysphagia and Failure To Thrive. Admitted with Dx of Acute Pyelonephritis. Found to have decreased LOC today. ABG = 7.20/62/54/24.2. Moved to ICU for BiPAP. Current medical regimen includes ASA, Vit B 12, Cardizem, Lovenox [place], Lexapro, Lodine, Iron Sulfate, Folic Acid, Lasix, Mucinex, Novolog SSI, Metformin, Lopressor, Dulera, MVI. Lovaza, Ditropan, Protonix, KCl, Prednisone and Vit C. Management per Hospitalist Group. Afebrile. BP = 112/53 and HR = 92. Sat - 100%,  eICU Interventions  Now on BiPAP for 1 hour. Will check repeat ABG. Otherwise will continue present management.      Intervention Category Evaluation Type: New Patient Evaluation  Lysle Dingwall 12/01/2015, 9:59 PM

## 2015-12-01 NOTE — Progress Notes (Signed)
Report given to CCU nurse, who will be taking care of pt in the CCU. VSS.   Suzanne Ewing

## 2015-12-01 NOTE — Progress Notes (Addendum)
Suzanne Ewing at [facility] NAME: Lynann Corro    MR#:  [id number]  DATE OF BIRTH:  Jan 16, 1946  SUBJECTIVE:  CHIEF COMPLAINT:   Chief Complaint  Patient presents with  . Fever   the patient is 70 year old female who presents to the hospital with fever and chills, fall smelling urine and drainage around the catheter. Patient also was noted to have right pleural effusion , for which she underwent 1.7 L thoracentesis 10th of January,  nonhealing ulcer in the left heel, bladder stones. Admits of intermittent dysphagia symptoms. Patient has not been ambulatory for the past 3 years and would like to start walking again. Complains of bilateral flank pains before coming to emergency room. Chest x-ray done today revealed reaccumulation of pleural fluid in the right chest.  Somnolent today and answers questions as "I don't know", complains of abdominal pain but not able to pinpoint where also nausea Review of Systems  Constitutional: Positive for malaise/fatigue. Negative for fever, chills and weight loss.  HENT: Negative for congestion.   Eyes: Negative for blurred vision and double vision.  Respiratory: Positive for shortness of breath. Negative for cough, sputum production and wheezing.   Cardiovascular: Negative for chest pain, palpitations, orthopnea, leg swelling and PND.  Gastrointestinal: Negative for nausea, vomiting, abdominal pain, diarrhea, constipation and blood in stool.  Genitourinary: Negative for dysuria, urgency, frequency and hematuria.  Musculoskeletal: Negative for falls.  Neurological: Negative for dizziness, tremors, focal weakness and headaches.  Endo/Heme/Allergies: Does not bruise/bleed easily.  Psychiatric/Behavioral: Negative for depression. The patient does not have insomnia.     VITAL SIGNS: Blood pressure 112/53, pulse 73, temperature 97.7 F (36.5 C), temperature source Axillary, resp. rate 21, height 5\' 6"  (1.676 m), weight  74.798 kg (164 lb 14.4 oz), SpO2 96 %.  PHYSICAL EXAMINATION:   GENERAL:  70 y.o.-year-old patient lying in the bed with no acute distress. somnolent and review of system as difficult to obtain, confused EYES: Pupils equal, round, reactive to light and accommodation. No scleral icterus. Extraocular muscles intact.  HEENT: Head atraumatic, normocephalic. Oropharynx and nasopharynx clear.  NECK:  Supple, no jugular venous distention. No thyroid enlargement, no tenderness.  LUNGS: diminished breath sounds on the right, relatively good air entrance on the left, radial, some crepitations on the right , no wheezing, rales,rhonchi or crepitation on the left. Not  using  accessory muscles of respiration. Patient is positioned on the right side of her chest CARDIOVASCULAR: S1, S2 normal. No murmurs, rubs, or gallops.  ABDOMEN: Soft, mild discomfort , diffusely but mostly in suprapubic area on palpation but no rebound or guarding, nondistended. Bowel sounds present. No organomegaly or mass.  EXTREMITIES: No pedal edema, cyanosis, or clubbing. Left heel ulcer was noted with mild serous drainage, no faul smell noted NEUROLOGIC: Cranial nerves II through XII are intact. Muscle strength 5/5 in all extremities. Sensation intact. Gait not checked.  PSYCHIATRIC: The patient is somnolent, but is able to be awakened with verbal stimuli, oriented x 3.  SKIN: No obvious rash, lesion, or ulcer, except ulceration and left heel. Some cyanotic feet bilaterally with diminished bilateral pulses in the lower extremities. Dressing displaced in the left buttock area, no drainage  ORDERS/RESULTS REVIEWED:   CBC  Recent Labs Lab 11/29/15 2119  WBC 4.4  HGB 8.9*  HCT 28.8*  PLT 208  MCV 76.9*  MCH 23.9*  MCHC 31.0*  RDW 18.6*  LYMPHSABS 0.6*  MONOABS 0.2  EOSABS 0.0  BASOSABS 0.0   ------------------------------------------------------------------------------------------------------------------  Chemistries    Recent Labs Lab 11/29/15 2119 11/30/15 0716 12/01/15 0635  NA 137 134* 134*  K 3.7 3.9  --   CL 104 105  --   CO2 26 25  --   GLUCOSE 115* 116*  --   BUN 27* 26*  --   CREATININE 0.62 0.67  --   CALCIUM 10.2 9.6  --   AST 14* 12*  --   ALT 10* 9*  --   ALKPHOS 65 61  --   BILITOT 0.6 0.4  --    ------------------------------------------------------------------------------------------------------------------ estimated creatinine clearance is 68.6 mL/min (by C-G formula based on Cr of 0.67). ------------------------------------------------------------------------------------------------------------------ No results for input(s): TSH, T4TOTAL, T3FREE, THYROIDAB in the last 72 hours.  Invalid input(s): FREET3  Cardiac Enzymes No results for input(s): CKMB, TROPONINI, MYOGLOBIN in the last 168 hours.  Invalid input(s): CK ------------------------------------------------------------------------------------------------------------------ Invalid input(s): POCBNP ---------------------------------------------------------------------------------------------------------------  RADIOLOGY: Dg Chest 1 View  11/30/2015  CLINICAL DATA:  Status post right thoracentesis removing 1.7 L. EXAM: CHEST 1 VIEW COMPARISON:  11/29/2015 FINDINGS: Rotated exam to the right. Near complete resolution of the right effusion following thoracentesis. Trace residual right effusion. Improvement in the right lower lobe aeration. No pneumothorax. Left lung remains clear. Heart is enlarged. Prior median sternotomy noted. IMPRESSION: No pneumothorax following right thoracentesis. Near complete resolution of the right effusion. Improved right base aeration. Electronically Signed   By: Jerilynn Mages.  Shick M.D.   On: 11/30/2015 14:16   Dg Chest 2 View  11/29/2015  CLINICAL DATA:  Dyspnea and fever. EXAM: CHEST  2 VIEW COMPARISON:  06/17/2015 FINDINGS: Patient is rotated limiting assessment. There is significant volume loss in the  right hemithorax with hazy opacity throughout right lower lung zone. Patient is post median sternotomy. Cardiomediastinal contours are obscured. Suspect small left pleural effusion. Minimal vascular congestion without overt edema. IMPRESSION: Rotated exam. Volume loss in the right hemithorax with hazy opacities throughout the right lower lung zone, suspect combination of atelectasis and pleural effusion. Underlying pneumonia not excluded. Small left pleural effusion. Recommend continued radiographic follow-up. Should volume loss persist, ultimately chest CT may be helpful. Electronically Signed   By: Jeb Levering M.D.   On: 11/29/2015 21:56   Dg Chest Port 1 View  12/01/2015  CLINICAL DATA:  Pleural effusion. EXAM: PORTABLE CHEST 1 VIEW COMPARISON:  11/30/2015 at 19:17 and 06/17/2015 FINDINGS: There is a large right pleural effusion, demonstrating rapid re-accumulation after thoracentesis yesterday. Pulmonary vascularity is normal.  Left lung is clear.  CABG. IMPRESSION: Increasing large right pleural effusion, with rapid re-accumulation after thoracentesis yesterday. Electronically Signed   By: Lorriane Shire M.D.   On: 12/01/2015 07:25   Dg Chest Port 1 View  11/30/2015  CLINICAL DATA:  Increased dyspnea after large volume right thoracentesis earlier today. EXAM: PORTABLE CHEST 1 VIEW COMPARISON:  Film at 1358 hours immediately following right thoracentesis. FINDINGS: New opacity in the right lower lung as well as vascular congestion is felt to be a combination of atelectasis and also potentially some re-expansion edema in the right lung after thoracentesis. Some component of reaccumulation of pleural fluid cannot be excluded by x-ray. There is no evidence of pneumothorax. The left lung is clear. The heart size is stable. IMPRESSION: In the interval since the immediate post thoracentesis chest x-ray, there is new consolidative density in the right lower lung as well as some suggestion of asymmetric  vascular congestion. This may be a combination of atelectasis and also  potentially re-expansion edema of the right lung. Component of pleural fluid reaccumulation cannot be excluded, but would not be expected to be significant in a relatively short time interval. Electronically Signed   By: Aletta Edouard M.D.   On: 11/30/2015 18:16   Ct Renal Stone Study  11/29/2015  CLINICAL DATA:  Pelvic pain in female. EXAM: CT ABDOMEN AND PELVIS WITHOUT CONTRAST TECHNIQUE: Multidetector CT imaging of the abdomen and pelvis was performed following the standard protocol without IV contrast. COMPARISON:  CT scan of August 01, 2007. FINDINGS: Status post surgical posterior fusion of L3, L4 and L5. Large right pleural effusion is noted with right lower lobe atelectasis. Status post cholecystectomy. Hepatic cirrhosis is noted. Splenic granuloma is noted. Pancreas is unremarkable. Stable left adrenal adenoma is noted. Right adrenal gland appears normal. Bilateral nephrolithiasis is noted. No hydronephrosis or renal obstruction is noted. Atherosclerosis of abdominal aorta is noted without aneurysm formation. Stable adenopathy is noted in retroperitoneal and porta hepatis regions. The appendix appears normal. There is no evidence of bowel obstruction. Urinary bladder is decompressed with Foley catheter. Uterine atrophy is noted. Small amount of free fluid is noted in the dependent portion of the pelvis. Continued presence of large hernia anteriorly in the pelvis which contains loops of small bowel without obstruction. Bladder calculi are noted. IMPRESSION: Large right pleural effusion with atelectasis of the right lower lobe. Hepatic cirrhosis is noted. Stable left adrenal adenoma. Atherosclerosis of abdominal aorta without aneurysm formation. Bilateral nephrolithiasis is noted without hydronephrosis or renal obstruction. Urinary bladder is decompressed secondary to Foley catheter. Multiple bladder calculi are noted. Continued  presence of large hernia involving anterior pelvic wall which contains loops of small bowel without obstruction. Electronically Signed   By: Marijo Conception, M.D.   On: 11/29/2015 22:27   US Thoracentesis Asp Pleural Space W/img Guide  11/30/2015  CLINICAL DATA:  Shortness of breath, respiratory difficulty, large right pleural effusion by CT EXAM: ULTRASOUND GUIDED RIGHT THORACENTESIS COMPARISON:  11/30/2015 PROCEDURE: An ultrasound guided thoracentesis was thoroughly discussed with the patient and questions answered. The benefits, risks, alternatives and complications were also discussed. The patient understands and wishes to proceed with the procedure. Written consent was obtained. Ultrasound was performed to localize and mark an adequate pocket of fluid in the right chest. The area was then prepped and draped in the normal sterile fashion. 1% Lidocaine was used for local anesthesia. Under ultrasound guidance a safety centesis needle catheter was introduced. Thoracentesis was performed. The catheter was removed and a dressing applied. Complications:  None immediate FINDINGS: A total of approximately 1.7 L of clear pleural fluid was removed. A fluid sample wassent for laboratory analysis. IMPRESSION: Successful ultrasound guided right thoracentesis yielding 1.7 L of pleural fluid. Electronically Signed   By: Jerilynn Mages.  Shick M.D.   On: 11/30/2015 13:55    EKG:  Orders placed or performed during the hospital encounter of 11/29/15  . ED EKG 12-Lead  . ED EKG 12-Lead    ASSESSMENT AND PLAN:  Principal Problem:   UTI (lower urinary tract infection) 1. Acute pyelonephritis due to gram-negative rods due to chronic indwelling Foley catheter, urine culture identification is pending, blood cultures are negative so far, continue patient on levofloxacin for now adjusting for culture results 2. Peripheral vascular disease, appreciate vascular surgery input, apparently patient had recent vascular evaluation, symptoms  were felt to be due to systemic medical disease,  3. Left heel ulcer, appreciate podiatry input, also is chronic and does not require  any intervention 4 . Hyponatremia, initiate patient on Lasix, follow sodium level tomorrow morning.  5. Bladder stonesdiscussed with Dr. Erlene Quan, urologist, who is going to see patient in consultation , continue antibiotic therapy 6. Right pleural effusion, status post thoracentesis January 10th 2017, pleural fluid cytology is pending . Marland Kitchen It is exudate, etiology is unclear. Could be related to CHF, getting echocardiogram, continue Lasix, getting thoracic surgeon, Dr. Genevive Bi to see patient in consultation since pleural fluid reaccumulation took less than 24 hours 7. Dysphagia continue dysphagia 3 diet , appreciate speech  Therapist input 8. 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 Altered mental status, somnolence, etiology is unclear unless related to infection, possibly unresponsive to levofloxacin, get ABGs to rule out CO2 retention, , acidosis. Ammonia level was checked, was found to be normal at 20   Management plans discussed with the patient, family and they are in agreement.   DRUG ALLERGIES:  Allergies  Allergen Reactions  . Macrobid [Nitrofurantoin Monohyd Macro] Itching, Rash and Swelling    PT HOSPITALIZED  . Penicillins Shortness Of Breath, Rash and Other (See Comments)    Has patient had a PCN reaction causing immediate rash, facial/tongue/throat swelling, SOB or lightheadedness with hypotension: Yes Has patient had a PCN reaction causing severe rash involving mucus membranes or skin necrosis: No Has patient had a PCN reaction that required hospitalization Yes Has patient had a PCN reaction occurring within the last 10 years: No If all of the above answers are "NO", then may proceed with Cephalosporin use.   . Tape Rash  . Azithromycin Other (See Comments)    Reaction:  unknown   . Clarithromycin Other (See Comments)    Abdominal pain  . Nitroglycerin Other (See Comments)    Reaction - unknown  . Leflunomide Rash  . Morphine Nausea And Vomiting    Nausea and vomiting  . Naproxen Rash    CODE STATUS:     Code Status Orders        Start     Ordered   11/30/15 0310  Full code   Continuous     11/30/15 0309    Code Status History    Date Active Date Inactive Code Status Order ID Comments User Context   06/12/2015  3:47 AM 06/18/2015  5:34 PM Full Code [id number]  Juluis Mire, MD Inpatient      TOTAL CRITICAL CARE TIME TAKING CARE OF THIS PATIENT:  40 minutes.     discusses palliative care physician, Dr. Serita Grit M.D on 12/01/2015 at 4:10 PM  Between 7am to 6pm - Pager - [phone]  After 6pm go to [email or web address] - password [email or web address] [facility]  Office  [phone]  CC: Primary care physician; Juluis Pitch, MD

## 2015-12-01 NOTE — Clinical Documentation Improvement (Signed)
Internal Medicine  A cause and effect relationship may not be assumed and must be documented by a provider.  Please clarify the relationship, if any, between UTI and Chronic indwelling Foley catheter  Are the conditions:   Due to or associated with each other  Unrelated to each other  Other  Clinically Undetermined   Supporting Information (risk factors, sign and symptoms, diagnostics, treatment): Patient admitted with acute pyelonephritis and UTI. Patient has chronic indwelling Foley catheter  Per H&P:  Catheter was changed yesterday after she had some foul-smelling urine and drainage around the catheter. Since yesterday evening patient had some low-grade fever and chills.  According to the family member patient has history of bilateral kidney stones and they were concerned that this was causing the infection.  Component     Latest Ref Rng 11/29/2015  Color, Urine     YELLOW YELLOW (A)  Appearance     CLEAR TURBID (A)  Glucose     NEGATIVE mg/dL NEGATIVE  Bilirubin Urine     NEGATIVE NEGATIVE  Ketones, ur     NEGATIVE mg/dL NEGATIVE  Specific Gravity, Urine     1.005 - 1.030 1.013  Hgb urine dipstick     NEGATIVE 2+ (A)  pH     5.0 - 8.0 7.0  Protein     NEGATIVE mg/dL 100 (A)  Nitrite     NEGATIVE POSITIVE (A)  Leukocytes, UA     NEGATIVE 3+ (A)  RBC / HPF     0 - 5 RBC/hpf TOO NUMEROUS TO COUNT  WBC, UA     0 - 5 WBC/hpf TOO NUMEROUS TO COUNT  Bacteria, UA     NONE SEEN NONE SEEN  Squamous Epithelial / LPF     NONE SEEN NONE SEEN  WBC Clumps      PRESENT  Mucous      PRESENT   Treatment: IV Levaquin 750mg  q24h UA & C&S  Please exercise your independent, professional judgment when responding. A specific answer is not anticipated or expected.   Thank You,  [place] [phone]

## 2015-12-02 DIAGNOSIS — K7460 Unspecified cirrhosis of liver: Secondary | ICD-10-CM

## 2015-12-02 DIAGNOSIS — R0600 Dyspnea, unspecified: Secondary | ICD-10-CM

## 2015-12-02 LAB — BLOOD GAS, ARTERIAL
Acid-base deficit: 1.6 mmol/L (ref 0.0–2.0)
Allens test (pass/fail): POSITIVE — AB
Bicarbonate: 25.6 mEq/L (ref 21.0–28.0)
Delivery systems: POSITIVE
Expiratory PAP: 5
FIO2: 0.35
Inspiratory PAP: 10
Mechanical Rate: 10
O2 Saturation: 93.5 %
Patient temperature: 37
RATE: 10 resp/min
pCO2 arterial: 52 mmHg — ABNORMAL HIGH (ref 32.0–48.0)
pH, Arterial: 7.3 — ABNORMAL LOW (ref 7.350–7.450)
pO2, Arterial: 76 mmHg — ABNORMAL LOW (ref 83.0–108.0)

## 2015-12-02 LAB — HEMOGLOBIN A1C: HEMOGLOBIN A1C: 6.2 % — AB (ref 4.0–6.0)

## 2015-12-02 LAB — GLUCOSE, CAPILLARY
GLUCOSE-CAPILLARY: 97 mg/dL (ref 65–99)
Glucose-Capillary: 110 mg/dL — ABNORMAL HIGH (ref 65–99)
Glucose-Capillary: 78 mg/dL (ref 65–99)
Glucose-Capillary: 108 mg/dL — ABNORMAL HIGH (ref 65–99)

## 2015-12-02 LAB — PROTIME-INR
INR: 1.3
Prothrombin Time: 16.3 seconds — ABNORMAL HIGH (ref 11.4–15.0)

## 2015-12-02 LAB — IRON AND TIBC
Iron: 13 ug/dL — ABNORMAL LOW (ref 28–170)
SATURATION RATIOS: 8 % — AB (ref 10.4–31.8)
TIBC: 174 ug/dL — AB (ref 250–450)
UIBC: 161 ug/dL

## 2015-12-02 LAB — POTASSIUM: Potassium: 3.8 mmol/L (ref 3.5–5.1)

## 2015-12-02 LAB — SODIUM: SODIUM: 135 mmol/L (ref 135–145)

## 2015-12-02 LAB — FERRITIN: FERRITIN: 175 ng/mL (ref 11–307)

## 2015-12-02 MED ORDER — TIOTROPIUM BROMIDE MONOHYDRATE 18 MCG IN CAPS
18.0000 ug | ORAL_CAPSULE | Freq: Every day | RESPIRATORY_TRACT | Status: DC
  Administered 2015-12-02 – 2015-12-06 (×5): 18 ug via RESPIRATORY_TRACT
  Filled 2015-12-02: qty 5

## 2015-12-02 MED ORDER — SPIRONOLACTONE 25 MG PO TABS
25.0000 mg | ORAL_TABLET | Freq: Two times a day (BID) | ORAL | Status: DC
  Administered 2015-12-02 – 2015-12-03 (×2): 25 mg via ORAL
  Filled 2015-12-02 (×2): qty 1

## 2015-12-02 MED ORDER — ALBUTEROL SULFATE (2.5 MG/3ML) 0.083% IN NEBU
2.5000 mg | INHALATION_SOLUTION | Freq: Four times a day (QID) | RESPIRATORY_TRACT | Status: DC
  Administered 2015-12-02 – 2015-12-04 (×7): 2.5 mg via RESPIRATORY_TRACT
  Filled 2015-12-02 (×7): qty 3

## 2015-12-02 MED ORDER — PENTOXIFYLLINE ER 400 MG PO TBCR
400.0000 mg | EXTENDED_RELEASE_TABLET | Freq: Three times a day (TID) | ORAL | Status: DC
  Administered 2015-12-02 – 2015-12-06 (×10): 400 mg via ORAL
  Filled 2015-12-02 (×13): qty 1

## 2015-12-02 NOTE — Progress Notes (Signed)
Pt tx 1C per MD, pt VSS, pt and family verbalized understanding of tx, report called to receiving RN all questions answered, updates also called upon awaited tx

## 2015-12-02 NOTE — Care Management (Signed)
Transferred to icu 1/11 from [facility] for what appears to have been the need for continuous bipap.  she is currently on nasal cannula. Had thoracentesis 1/11 with 1.7 liters of fluid removed.  She paresents from [employer]

## 2015-12-02 NOTE — Consult Note (Cosign Needed)
[facility]  [address], [place], [place] [address] Phone: [phone] Fax : [phone]  Consultation  Referring Provider:     No ref. provider found Primary Care Physician:  Juluis Pitch, MD Primary Gastroenterologist:  None Reason for Consultation:     Cirrhosis Suggested on imaging  Date of Admission:  11/29/2015 Date of Consultation:  12/02/2015         HPI:   Suzanne Ewing is a 70 y.o. female who was admitted with fevers and chills and thought to have a urinary tract infection. The patient was set up for a CT scan that showed an incidental finding of cirrhosis. The patient reports that her father was a drinker and had cirrhosis but she has never drank alcohol to excess. She also denies ever being told she had abnormal liver enzymes or liver disease in the past. The patient has normal liver enzymes. I'm now being consultative for evaluation of abnormal CT scan.This consult was done when the patient was in the intensive care unit for a diagnosis of acute polyp nephritis pleural effusion.  Past Medical History  Diagnosis Date  . Arthritis   . Anemia   . GERD (gastroesophageal reflux disease)   . Heart disease   . Hypertension   . Nephrolithiasis   . Heel ulcer ([place])   . Diabetes mellitus without complication ([place])   . Hyperlipidemia   . Coronary artery disease     Past Surgical History  Procedure Laterality Date  . Coronary artery bypass graft    . Back surgery    . Cholecystectomy    . Shoulder surgery    . Knee surgery Left   . Carpal tunnel release Bilateral   . Joint replacement      Prior to Admission medications   Medication Sig Start Date End Date Taking? Authorizing Provider  acetaminophen (TYLENOL) 325 MG tablet Take 2 tablets (650 mg total) by mouth every 6 (six) hours as needed for mild pain (or Fever >/= 101). Patient taking differently: Take 650 mg by mouth every 6 (six) hours as needed for mild pain or headache.  06/18/15  Yes  Glendon Axe, MD  alum & mag hydroxide-simeth (GERI-LANTA) 200-200-20 MG/5ML suspension Take 30 mLs by mouth every 4 (four) hours as needed for indigestion or heartburn.   Yes Historical Provider, MD  Amino Acids-Protein Hydrolys (FEEDING SUPPLEMENT, PRO-STAT SUGAR FREE 64,) LIQD Take 30 mLs by mouth 2 (two) times daily.   Yes Historical Provider, MD  ascorbic acid (VITAMIN C) 1000 MG tablet Take 1,000 mg by mouth daily.   Yes Historical Provider, MD  cyanocobalamin (,VITAMIN B-12,) 1000 MCG/ML injection Inject 1,000 mcg into the muscle every 30 (thirty) days. Given on the 26th day of the month   Yes Historical Provider, MD  dextromethorphan-guaiFENesin (MUCINEX DM) 30-600 MG 12hr tablet Take 1 tablet by mouth at bedtime.   Yes Historical Provider, MD  diltiazem (CARDIZEM SR) 120 MG 12 hr capsule Take 120 mg by mouth 2 (two) times daily.   Yes Historical Provider, MD  Diphenhyd-Hydrocort-Nystatin (FIRST-DUKES MOUTHWASH MT) Use as directed 5 mLs in the mouth or throat 4 (four) times daily as needed (candidiasis).   Yes Historical Provider, MD  diphenhydrAMINE (SOMINEX) 25 MG tablet Take 25 mg by mouth every 6 (six) hours as needed for itching or sleep.    Yes Historical Provider, MD  escitalopram (LEXAPRO) 10 MG tablet Take 10 mg by mouth daily.   Yes Historical Provider, MD  etodolac (  LODINE) 400 MG tablet Take 400 mg by mouth 2 (two) times daily.   Yes Historical Provider, MD  ferrous sulfate 325 (65 FE) MG tablet Take 325 mg by mouth 3 (three) times daily.    Yes Historical Provider, MD  Fluticasone-Salmeterol (ADVAIR) 100-50 MCG/DOSE AEPB Inhale 1 puff into the lungs 2 (two) times daily.   Yes Historical Provider, MD  folic acid (FOLVITE) 1 MG tablet Take 1 mg by mouth daily.    Yes Historical Provider, MD  furosemide (LASIX) 20 MG tablet Take 20 mg by mouth 2 (two) times daily.   Yes Historical Provider, MD  glucose blood test strip 1 each by Other route 4 (four) times daily -  before meals and  at bedtime. Use as instructed   Yes Historical Provider, MD  guaiFENesin (MUCINEX) 600 MG 12 hr tablet Take 300 mg by mouth daily.   Yes Historical Provider, MD  ibandronate (BONIVA) 3 MG/3ML SOLN injection Inject 3 mg into the vein every 3 (three) months. Taken on the 12th day of the month   Yes Historical Provider, MD  insulin detemir (LEVEMIR) 100 unit/ml SOLN Inject 15 Units into the skin daily.   Yes Historical Provider, MD  loperamide (IMODIUM) 2 MG capsule Take 1 capsule (2 mg total) by mouth every 6 (six) hours as needed for diarrhea or loose stools. 06/18/15  Yes Glendon Axe, MD  loratadine (CLARITIN) 10 MG tablet Take 10 mg by mouth daily as needed for allergies.   Yes Historical Provider, MD  LORazepam (ATIVAN) 0.5 MG tablet Take 0.5 mg by mouth every 8 (eight) hours as needed for anxiety. Take 1/2 to 1(one) tablet by mouth once daily as needed for anxiety per medication list   Yes Historical Provider, MD  metFORMIN (GLUCOPHAGE-XR) 500 MG 24 hr tablet Take 500 mg by mouth daily with breakfast.   Yes Historical Provider, MD  metoprolol tartrate (LOPRESSOR) 25 MG tablet Take 25 mg by mouth 2 (two) times daily.   Yes Historical Provider, MD  Multiple Vitamins-Minerals (CENTRUM SILVER PO) Take 1 tablet by mouth daily.    Yes Historical Provider, MD  Omega-3 Fatty Acids (FISH OIL) 1000 MG CAPS Take 1 capsule by mouth daily.    Yes Historical Provider, MD  omeprazole (PRILOSEC) 20 MG capsule Take 20 mg by mouth 2 (two) times daily.    Yes Historical Provider, MD  ondansetron (ZOFRAN) 4 MG tablet Take 4 mg by mouth every 6 (six) hours as needed for nausea or vomiting.   Yes Historical Provider, MD  oxybutynin (DITROPAN) 5 MG tablet Take 5 mg by mouth 2 (two) times daily.   Yes Historical Provider, MD  potassium chloride (KLOR-CON M10) 10 MEQ tablet Take 1 tablet (10 mEq total) by mouth daily. Take with Lasix 06/18/15  Yes Glendon Axe, MD  predniSONE (DELTASONE) 2.5 MG tablet Take 7.5 mg by mouth  daily with breakfast.   Yes Historical Provider, MD  promethazine (PHENERGAN) 25 MG tablet Take 25 mg by mouth every 6 (six) hours as needed for nausea or vomiting.   Yes Historical Provider, MD  traMADol (ULTRAM) 50 MG tablet Take 50 mg by mouth every 4 (four) hours as needed for moderate pain.  04/23/15  Yes Historical Provider, MD  vitamin A 10000 UNIT capsule Take 10,000 Units by mouth daily.    Yes Historical Provider, MD  aspirin EC 81 MG EC tablet Take 1 tablet (81 mg total) by mouth daily. Patient not taking: Reported on 11/29/2015 06/18/15  Glendon Axe, MD  bismuth subsalicylate (PEPTO BISMOL) 262 MG/15ML suspension Take 30 mLs by mouth every 4 (four) hours as needed for diarrhea or loose stools. Patient not taking: Reported on 11/29/2015 06/18/15   Glendon Axe, MD  collagenase (SANTYL) ointment Apply topically daily. Patient not taking: Reported on 11/29/2015 06/18/15   Glendon Axe, MD  insulin aspart (NOVOLOG) 100 UNIT/ML injection Inject 0-9 Units into the skin 3 (three) times daily with meals. Patient not taking: Reported on 11/29/2015 06/18/15   Glendon Axe, MD  nystatin (MYCOSTATIN) 100000 UNIT/ML suspension Take 5 mLs (500,000 Units total) by mouth 4 (four) times daily. Patient not taking: Reported on 11/29/2015 06/18/15   Glendon Axe, MD  ondansetron [facility]) 4 MG/2ML SOLN injection Inject 2 mLs (4 mg total) into the vein every 6 (six) hours as needed for nausea. Patient not taking: Reported on 11/29/2015 06/18/15   Glendon Axe, MD    Family History  Problem Relation Age of Onset  . Stroke Mother   . Diabetes type II Sister   . Hypertension Sister      Social History  Substance Use Topics  . Smoking status: Former Smoker    Quit date: 06/03/2014  . Smokeless tobacco: Never Used  . Alcohol Use: No    Allergies as of 11/29/2015 - Review Complete 11/29/2015  Allergen Reaction Noted  . Macrobid [nitrofurantoin monohyd macro] Itching, Rash, and Swelling 06/04/2015  .  Penicillins Shortness Of Breath, Rash, and Other (See Comments) 06/04/2015  . Tape Rash 06/04/2015  . Azithromycin Other (See Comments) 11/29/2015  . Clarithromycin Other (See Comments) 06/04/2015  . Nitroglycerin Other (See Comments) 06/04/2015  . Leflunomide Rash 06/04/2015  . Morphine Nausea And Vomiting 06/04/2015  . Naproxen Rash 06/04/2015    Review of Systems:    All systems reviewed and negative except where noted in HPI.   Physical Exam:  Vital signs in last 24 hours: Temp:  [98.4 F (36.9 C)-98.7 F (37.1 C)] 98.7 F (37.1 C) (01/12 1609) Pulse Rate:  [87-101] 101 (01/12 1609) Resp:  [15-26] 25 (01/12 1400) BP: (111-135)/(43-97) 135/88 mmHg (01/12 1609) SpO2:  [96 %-100 %] 98 % (01/12 1609) FiO2 (%):  [28 %-35 %] 28 % (01/12 1439) Weight:  [200 lb 9.9 oz (91 kg)] 200 lb 9.9 oz (91 kg) (01/11 2015) Last BM Date:  (pt unable to report ) General:   Pleasant, cooperative in NAD Head:  Normocephalic and atraumatic. Eyes:   No icterus.   Conjunctiva pink. PERRLA. Ears:  Normal auditory acuity. Neck:  Supple; no masses or thyroidomegaly Lungs: Respirations Regular with Diffuse wheezing. Heart:  Regular rate and rhythm;  Without murmur, clicks, rubs or gallops Abdomen:  Soft, nondistended, nontender. Normal bowel sounds. No appreciable masses or hepatomegaly.  No rebound or guarding.  Rectal:  Not performed. Msk:  Symmetrical without gross deformities. The patient appears emaciated   Extremities:  Without edema, cyanosis or clubbing. Neurologic:  Alert and oriented x3;  grossly normal neurologically. Skin:  Intact without significant rashes.There was bruising on both arms bilaterally Cervical Nodes:  No significant cervical adenopathy. Psych:  Alert and cooperative. Normal affect.  LAB RESULTS:  Recent Labs  11/29/15 2119  WBC 4.4  HGB 8.9*  HCT 28.8*  PLT 208   BMET  Recent Labs  11/29/15 2119 11/30/15 0716 12/01/15 0635 12/02/15 0635  NA 137 134* 134*  135  K 3.7 3.9  --  3.8  CL 104 105  --   --   CO2  26 25  --   --   GLUCOSE 115* 116*  --   --   BUN 27* 26*  --   --   CREATININE 0.62 0.67  --   --   CALCIUM 10.2 9.6  --   --    LFT  Recent Labs  11/30/15 0716  PROT 6.8  ALBUMIN 2.5*  AST 12*  ALT 9*  ALKPHOS 61  BILITOT 0.4   PT/INR  Recent Labs  12/02/15 1652  LABPROT 16.3*  INR 1.30    STUDIES: Dg Chest Port 1 View  12/01/2015  CLINICAL DATA:  Pleural effusion. EXAM: PORTABLE CHEST 1 VIEW COMPARISON:  11/30/2015 at 19:17 and 06/17/2015 FINDINGS: There is a large right pleural effusion, demonstrating rapid re-accumulation after thoracentesis yesterday. Pulmonary vascularity is normal.  Left lung is clear.  CABG. IMPRESSION: Increasing large right pleural effusion, with rapid re-accumulation after thoracentesis yesterday. Electronically Signed   By: Lorriane Shire M.D.   On: 12/01/2015 07:25   Dg Chest Port 1 View  11/30/2015  CLINICAL DATA:  Increased dyspnea after large volume right thoracentesis earlier today. EXAM: PORTABLE CHEST 1 VIEW COMPARISON:  Film at 1358 hours immediately following right thoracentesis. FINDINGS: New opacity in the right lower lung as well as vascular congestion is felt to be a combination of atelectasis and also potentially some re-expansion edema in the right lung after thoracentesis. Some component of reaccumulation of pleural fluid cannot be excluded by x-ray. There is no evidence of pneumothorax. The left lung is clear. The heart size is stable. IMPRESSION: In the interval since the immediate post thoracentesis chest x-ray, there is new consolidative density in the right lower lung as well as some suggestion of asymmetric vascular congestion. This may be a combination of atelectasis and also potentially re-expansion edema of the right lung. Component of pleural fluid reaccumulation cannot be excluded, but would not be expected to be significant in a relatively short time interval. Electronically  Signed   By: Aletta Edouard M.D.   On: 11/30/2015 18:16      Impression / Plan:   Suzanne Ewing is a 70 y.o. y/o female with  Who was admitted to the ICU with polyp nephritis and a history of failure to thrive. The patient was getting a parent care consultation but was noted on the CT scan to have cirrhosis without any history of liver disease or apparent sequela of her cirrhosis. The patient will have multiple lab sent off to look for possible causes of her abnormal imaging. Workup for this does not need to be done as an inpatient and she could be followed as an outpatient due to this process likely being a chronic condition. I have ordered labs on her and will follow up with her as an outpatient when she discharge. I will sign off at this time as there is nothing further to do to treat her cirrhosis with normal liver enzymes. Please call if you have any further questions.  Thank you for involving me in the care of this patient.      LOS: 2 days   Ollen Bowl, MD  12/02/2015, 5:38 PM   Note: This dictation was prepared with Dragon dictation along with smaller phrase technology. Any transcriptional errors that result from this process are unintentional.

## 2015-12-02 NOTE — Progress Notes (Signed)
RT arrived to room and found patient on 2lpm [place], no distress noted, will continue to monitor.

## 2015-12-02 NOTE — Progress Notes (Signed)
[place] [facility] [place] NAME: Suzanne Ewing    MR#:  [id number]  DATE OF BIRTH:  1946/01/17  SUBJECTIVE:  CHIEF COMPLAINT:   Chief Complaint  Patient presents with  . Fever   the patient is 70 year old female who presents to the hospital with fever and chills, fall smelling urine and drainage around the catheter. Patient also was noted to have right pleural effusion , for which she underwent 1.7 L thoracentesis 10th of January,  nonhealing ulcer in the left heel, bladder stones. Admits of intermittent dysphagia symptoms. Patient has not been ambulatory for the past 3 years and would like to start walking again. Complained of bilateral flank pains before coming to emergency room. Chest x-ray done today revealed reaccumulation of pleural fluid in the right chest in about 8-12 hours after thoracentesis. Thoracic surgeon does not recommend Peridex catheter placement. Patient was transferred to CCU due to somnolence and retention of CO2 for BiPAP, however, refuses BiPAP. Eventually. She is on nasal cannulas now and is much more alert and communicative. Feels better, denies shortness of breath.   Review of Systems  Constitutional: Positive for malaise/fatigue. Negative for fever, chills and weight loss.  HENT: Negative for congestion.   Eyes: Negative for blurred vision and double vision.  Respiratory: Positive for shortness of breath. Negative for cough, sputum production and wheezing.   Cardiovascular: Negative for chest pain, palpitations, orthopnea, leg swelling and PND.  Gastrointestinal: Negative for nausea, vomiting, abdominal pain, diarrhea, constipation and blood in stool.  Genitourinary: Negative for dysuria, urgency, frequency and hematuria.  Musculoskeletal: Negative for falls.  Neurological: Negative for dizziness, tremors, focal weakness and headaches.  Endo/Heme/Allergies: Does not bruise/bleed easily.  Psychiatric/Behavioral: Negative  for depression. The patient does not have insomnia.     VITAL SIGNS: Blood pressure 131/61, pulse 101, temperature 98.7 F (37.1 C), temperature source Oral, resp. rate 25, height 5\' 6"  (1.676 m), weight 91 kg (200 lb 9.9 oz), SpO2 100 %.  PHYSICAL EXAMINATION:   GENERAL:  70 y.o.-year-old patient lying in the bed with no acute distress. More alert today, answers questions appropriately EYES: Pupils equal, round, reactive to light and accommodation. No scleral icterus. Extraocular muscles intact.  HEENT: Head atraumatic, normocephalic. Oropharynx and nasopharynx clear.  NECK:  Supple, no jugular venous distention. No thyroid enlargement, no tenderness.  LUNGS: diminished breath sounds on the right, relatively good air entrance on the left, radial, some crepitations on the right , no wheezing, rales,rhonchi or crepitation on the left. Not  using  accessory muscles of respiration. Patient is positioned on the right side of her chest CARDIOVASCULAR: S1, S2 normal. No murmurs, rubs, or gallops.  ABDOMEN: Soft, mild discomfort , diffusely but mostly in suprapubic area on palpation but no rebound or guarding, nondistended. Bowel sounds present. No organomegaly or mass.  EXTREMITIES: No pedal edema, cyanosis, or clubbing. Left heel ulcer was noted with mild serous drainage, no faul smell noted NEUROLOGIC: Cranial nerves II through XII are intact. Muscle strength 5/5 in all extremities. Sensation intact. Gait not checked.  PSYCHIATRIC: The patient is more alert,, oriented x 3.  SKIN: No obvious rash, lesion, or ulcer, except ulceration and left heel. Some cyanotic feet bilaterally with diminished bilateral pulses in the lower extremities. Dressing displaced in the left buttock area, no drainage  ORDERS/RESULTS REVIEWED:   CBC  Recent Labs Lab 11/29/15 2119  WBC 4.4  HGB 8.9*  HCT 28.8*  PLT 208  MCV 76.9*  MCH 23.9*  MCHC 31.0*  RDW 18.6*  LYMPHSABS 0.6*  MONOABS 0.2  EOSABS 0.0   BASOSABS 0.0   ------------------------------------------------------------------------------------------------------------------  Chemistries   Recent Labs Lab 11/29/15 2119 11/30/15 0716 12/01/15 0635 12/02/15 0635  NA 137 134* 134* 135  K 3.7 3.9  --  3.8  CL 104 105  --   --   CO2 26 25  --   --   GLUCOSE 115* 116*  --   --   BUN 27* 26*  --   --   CREATININE 0.62 0.67  --   --   CALCIUM 10.2 9.6  --   --   AST 14* 12*  --   --   ALT 10* 9*  --   --   ALKPHOS 65 61  --   --   BILITOT 0.6 0.4  --   --    ------------------------------------------------------------------------------------------------------------------ estimated creatinine clearance is 75.4 mL/min (by C-G formula based on Cr of 0.67). ------------------------------------------------------------------------------------------------------------------ No results for input(s): TSH, T4TOTAL, T3FREE, THYROIDAB in the last 72 hours.  Invalid input(s): FREET3  Cardiac Enzymes No results for input(s): CKMB, TROPONINI, MYOGLOBIN in the last 168 hours.  Invalid input(s): CK ------------------------------------------------------------------------------------------------------------------ Invalid input(s): POCBNP ---------------------------------------------------------------------------------------------------------------  RADIOLOGY: Dg Chest Port 1 View  12/01/2015  CLINICAL DATA:  Pleural effusion. EXAM: PORTABLE CHEST 1 VIEW COMPARISON:  11/30/2015 at 19:17 and 06/17/2015 FINDINGS: There is a large right pleural effusion, demonstrating rapid re-accumulation after thoracentesis yesterday. Pulmonary vascularity is normal.  Left lung is clear.  CABG. IMPRESSION: Increasing large right pleural effusion, with rapid re-accumulation after thoracentesis yesterday. Electronically Signed   By: Lorriane Shire M.D.   On: 12/01/2015 07:25   Dg Chest Port 1 View  11/30/2015  CLINICAL DATA:  Increased dyspnea after large volume  right thoracentesis earlier today. EXAM: PORTABLE CHEST 1 VIEW COMPARISON:  Film at 1358 hours immediately following right thoracentesis. FINDINGS: New opacity in the right lower lung as well as vascular congestion is felt to be a combination of atelectasis and also potentially some re-expansion edema in the right lung after thoracentesis. Some component of reaccumulation of pleural fluid cannot be excluded by x-ray. There is no evidence of pneumothorax. The left lung is clear. The heart size is stable. IMPRESSION: In the interval since the immediate post thoracentesis chest x-ray, there is new consolidative density in the right lower lung as well as some suggestion of asymmetric vascular congestion. This may be a combination of atelectasis and also potentially re-expansion edema of the right lung. Component of pleural fluid reaccumulation cannot be excluded, but would not be expected to be significant in a relatively short time interval. Electronically Signed   By: Aletta Edouard M.D.   On: 11/30/2015 18:16    EKG:  Orders placed or performed during the hospital encounter of 11/29/15  . ED EKG 12-Lead  . ED EKG 12-Lead    ASSESSMENT AND PLAN:  Principal Problem:   UTI (lower urinary tract infection) Active Problems:   Dyspnea 1. Acute pyelonephritis due to Proteus mirabilis due to chronic indwelling Foley catheter, urine culture sensitivities are  pending, blood cultures are negative so far, continue patient on levofloxacin for now adjusting for culture results 2. Peripheral vascular disease, appreciate vascular surgery input, apparently patient had recent vascular evaluation, symptoms were felt to be due to systemic medical disease,  3. Left heel ulcer, appreciate podiatry input, also is chronic and does not require any intervention 4 . Hyponatremia, improved when Patient was  initiated on Lasix, follow sodium level . Intermittently.  5. Bladder stones, discussed with Dr. Erlene Quan, urologist, who  is going to see patient in consultation , continue antibiotic therapy 6. Right pleural effusion, likely due to liver cirrhosis and ascites , status post thoracentesis January 10th 2017, pleural fluid cytology is pending . Marland Kitchen It is exudate, echocardiogram is pending, continue Lasix, adding spironolactone, appreciate  Dr. Genevive Bi input 7. Dysphagia continue dysphagia 3 diet , appreciate speech  Therapist input 8. 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 Altered mental status, somnolence, due to CO2 retention ,resolved now  Ammonia level was  normal at 20  10. Liver cirrhosis, getting hepatitis panel, further workup by gastroenterology as outpatient, discussed extensively with patient's family  Management plans discussed with the patient, family and they are in agreement.   DRUG ALLERGIES:  Allergies  Allergen Reactions  . Macrobid [Nitrofurantoin Monohyd Macro] Itching, Rash and Swelling    PT HOSPITALIZED  . Penicillins Shortness Of Breath, Rash and Other (See Comments)    Has patient had a PCN reaction causing immediate rash, facial/tongue/throat swelling, SOB or lightheadedness with hypotension: Yes Has patient had a PCN reaction causing severe rash involving mucus membranes or skin necrosis: No Has patient had a PCN reaction that required hospitalization Yes Has patient had a PCN reaction occurring within the last 10 years: No If all of the above answers are "NO", then may proceed with Cephalosporin use.   . Tape Rash  . Azithromycin Other (See Comments)    Reaction: unknown   . Clarithromycin Other (See Comments)    Abdominal pain  . Nitroglycerin Other (See Comments)    Reaction - unknown  . Leflunomide Rash  . Morphine Nausea And Vomiting    Nausea and vomiting  . Naproxen Rash    CODE STATUS:     Code Status Orders        Start     Ordered   11/30/15 0310  Full code   Continuous     11/30/15  0309    Code Status History    Date Active Date Inactive Code Status Order ID Comments User Context   06/12/2015  3:47 AM 06/18/2015  5:34 PM Full Code [id number]  Juluis Mire, MD Inpatient      TOTAL CRITICAL CARE TIME TAKING CARE OF THIS PATIENT: 60 minutes minutes .   Discussed with patient's family, patient's daughter and her husband , time spent approximately 20 minutes on discussions  Persis Graffius M.D on 12/02/2015 at 3:25 PM  Between 7am to 6pm - Pager - [phone]  After 6pm go to [email or web address] - password [email or web address] [email or web address]  Office  [phone]  CC: Primary care physician; Juluis Pitch, MD

## 2015-12-02 NOTE — Plan of Care (Signed)
Problem: Safety: Goal: Ability to remain free from injury will improve Outcome: Progressing Patient is a high fall risk, calls out for needs.  Problem: Health Behavior/Discharge Planning: Goal: Ability to manage health-related needs will improve Outcome: Progressing From [facility]  Problem: Pain Managment: Goal: General experience of comfort will improve Outcome: Progressing No c/o pain at this time.  Problem: Physical Regulation: Goal: Will remain free from infection Outcome: Progressing On Contact precautions for ESBL in the urine.  Problem: Skin Integrity: Goal: Risk for impaired skin integrity will decrease Outcome: Progressing Dressings changed. q2h turns. Patient has a sizewise bed.  Problem: Activity: Goal: Risk for activity intolerance will decrease Outcome: Progressing Patient needs assistance with movement in bed.  Problem: Fluid Volume: Goal: Ability to maintain a balanced intake and output will improve Outcome: Progressing Encouraging PO intake     Problem: Nutrition: Goal: Adequate nutrition will be maintained Outcome: Progressing Patient at less than half of dinner.  Problem: Bowel/Gastric: Goal: Will not experience complications related to bowel motility Outcome: Progressing Colostomy bag intact.  Problem: Urinary Elimination: Goal: Signs and symptoms of infection will decrease Outcome: Progressing Patients foley changed in CCU today.

## 2015-12-02 NOTE — Progress Notes (Signed)
Patient ID: Suzanne Ewing, female   DOB: 28-Jan-1946, 70 y.o.   MRN: [id number]  Chief Complaint  Patient presents with  . Fever    Referred By Dr. Ether Griffins   Reason for Referral Right pleural effusion  HPI Location, Quality, Duration, Severity, Timing, Context, Modifying Factors, Associated Signs and Symptoms.  Suzanne Ewing is a 70 y.o. female.  She was admitted to the hospital from a skilled nursing facility for increasing shortness of breath. The history is obtained mostly from her husband who is present in the room. He states that she has been at the skilled nursing facility for almost 7 months after she developed a foot ulcer and a sacral decubitus. At some point she underwent a diverting colostomy and did reasonably well from that. However she's been essentially bedridden since that time with minimal activities. Her husband states that she's been complaining of a couple week history of increasing shortness of breath but no other symptoms. She was admitted and found to have a large right-sided pleural effusion. She underwent a thoracentesis of 1.7 L of clear fluid. The fluid analysis was examined and does not appear to be infected. The husband believes that she she has improved after the thoracentesis although it's not clear how. Patient is arousable and communicative but is somewhat lethargic and difficult to interview. She essentially states that she can't tell if she is any better or not since the fluid has been withdrawn. According to the husband this is the first time this is ever happened. There is no prior history of a malignancy. There was no fever or chills prior to her admission to the hospital.   Past Medical History  Diagnosis Date  . Arthritis   . Anemia   . GERD (gastroesophageal reflux disease)   . Heart disease   . Hypertension   . Nephrolithiasis   . Heel ulcer ([place])   . Diabetes mellitus without complication ([place])   . Hyperlipidemia   . Coronary artery disease      Past Surgical History  Procedure Laterality Date  . Coronary artery bypass graft    . Back surgery    . Cholecystectomy    . Shoulder surgery    . Knee surgery Left   . Carpal tunnel release Bilateral   . Joint replacement      Family History  Problem Relation Age of Onset  . Stroke Mother   . Diabetes type II Sister   . Hypertension Sister     Social History Social History  Substance Use Topics  . Smoking status: Former Smoker    Quit date: 06/03/2014  . Smokeless tobacco: Never Used  . Alcohol Use: No    Allergies  Allergen Reactions  . Macrobid [Nitrofurantoin Monohyd Macro] Itching, Rash and Swelling    PT HOSPITALIZED  . Penicillins Shortness Of Breath, Rash and Other (See Comments)    Has patient had a PCN reaction causing immediate rash, facial/tongue/throat swelling, SOB or lightheadedness with hypotension: Yes Has patient had a PCN reaction causing severe rash involving mucus membranes or skin necrosis: No Has patient had a PCN reaction that required hospitalization Yes Has patient had a PCN reaction occurring within the last 10 years: No If all of the above answers are "NO", then may proceed with Cephalosporin use.   . Tape Rash  . Azithromycin Other (See Comments)    Reaction: unknown   . Clarithromycin Other (See Comments)    Abdominal pain  . Nitroglycerin Other (See Comments)  Reaction - unknown  . Leflunomide Rash  . Morphine Nausea And Vomiting    Nausea and vomiting  . Naproxen Rash    Current Facility-Administered Medications  Medication Dose Route Frequency Provider Last Rate Last Dose  . 0.9 %  sodium chloride infusion  250 mL Intravenous PRN Saundra Shelling, MD      . acetaminophen (TYLENOL) tablet 650 mg  650 mg Oral Q6H PRN Pavan Pyreddy, MD      . antiseptic oral rinse (CPC / CETYLPYRIDINIUM CHLORIDE 0.05%) solution 7 mL  7 mL Mouth Rinse q12n4p Theodoro Grist, MD   7 mL at 12/01/15 1655  . aspirin EC tablet 81 mg  81 mg Oral Daily  Saundra Shelling, MD   81 mg at 12/01/15 0921  . [START ON 12/16/2015] cyanocobalamin ((VITAMIN B-12)) injection 1,000 mcg  1,000 mcg Intramuscular Q30 days Saundra Shelling, MD      . diltiazem (CARDIZEM SR) 12 hr capsule 120 mg  120 mg Oral BID Saundra Shelling, MD   120 mg at 11/30/15 2319  . diphenhydrAMINE (BENADRYL) capsule 25 mg  25 mg Oral Q6H PRN Pavan Pyreddy, MD      . enoxaparin (LOVENOX) injection 40 mg  40 mg Subcutaneous Q24H Saundra Shelling, MD   40 mg at 12/01/15 2203  . escitalopram (LEXAPRO) tablet 10 mg  10 mg Oral Daily Saundra Shelling, MD   10 mg at 12/01/15 0921  . etodolac (LODINE) tablet 400 mg  400 mg Oral BID Saundra Shelling, MD   400 mg at 12/01/15 [id number]  . feeding supplement (PRO-STAT SUGAR FREE 64) liquid 30 mL  30 mL Oral BID Saundra Shelling, MD   30 mL at 11/30/15 1641  . ferrous sulfate tablet 325 mg  325 mg Oral TID Saundra Shelling, MD   325 mg at 12/01/15 1701  . folic acid (FOLVITE) tablet 1 mg  1 mg Oral Daily Pavan Pyreddy, MD   1 mg at 12/01/15 [id number]  . furosemide (LASIX) tablet 20 mg  20 mg Oral BID Theodoro Grist, MD   20 mg at 12/01/15 1701  . guaiFENesin (MUCINEX) 12 hr tablet 600 mg  600 mg Oral BID Saundra Shelling, MD   600 mg at 12/01/15 0921  . insulin aspart (novoLOG) injection 0-9 Units  0-9 Units Subcutaneous TID WC Theodoro Grist, MD   1 Units at 12/01/15 1649  . insulin aspart (novoLOG) injection 3 Units  3 Units Subcutaneous TID WC Theodoro Grist, MD   3 Units at 11/30/15 1638  . insulin glargine (LANTUS) injection 15 Units  15 Units Subcutaneous Daily Saundra Shelling, MD   15 Units at 11/30/15 1638  . ipratropium-albuterol (DUONEB) 0.5-2.5 (3) MG/3ML nebulizer solution 3 mL  3 mL Nebulization Q6H Pavan Pyreddy, MD   3 mL at 12/02/15 0847  . loratadine (CLARITIN) tablet 10 mg  10 mg Oral Daily PRN Saundra Shelling, MD      . LORazepam (ATIVAN) tablet 0.5 mg  0.5 mg Oral Q8H PRN Saundra Shelling, MD   0.5 mg at 11/30/15 1054  . magic mouthwash  10 mL Oral QID PRN Saundra Shelling, MD       . meropenem (MERREM) 1 g in sodium chloride 0.9 % 100 mL IVPB  1 g Intravenous 3 times per day Henreitta Leber, MD   1 g at 12/02/15 0631  . metFORMIN (GLUCOPHAGE-XR) 24 hr tablet 500 mg  500 mg Oral Q breakfast Saundra Shelling, MD   500 mg at 12/01/15 0850  .  metoprolol tartrate (LOPRESSOR) tablet 25 mg  25 mg Oral BID Saundra Shelling, MD   25 mg at 11/30/15 2319  . mometasone-formoterol (DULERA) 100-5 MCG/ACT inhaler 2 puff  2 puff Inhalation BID Saundra Shelling, MD   2 puff at 12/01/15 0854  . multivitamin with minerals tablet 1 tablet  1 tablet Oral Daily Saundra Shelling, MD   1 tablet at 12/01/15 0921  . omega-3 acid ethyl esters (LOVAZA) capsule 1 g  1 g Oral Daily Saundra Shelling, MD   1 g at 12/01/15 0921  . ondansetron (ZOFRAN) tablet 4 mg  4 mg Oral Q6H PRN Saundra Shelling, MD   4 mg at 12/01/15 0850  . oxybutynin (DITROPAN) tablet 5 mg  5 mg Oral BID Saundra Shelling, MD   5 mg at 12/01/15 0921  . pantoprazole (PROTONIX) EC tablet 40 mg  40 mg Oral Daily Saundra Shelling, MD   40 mg at 12/01/15 0921  . potassium chloride (KLOR-CON) packet 20 mEq  20 mEq Oral BID Theodoro Grist, MD   20 mEq at 12/01/15 0921  . predniSONE (DELTASONE) tablet 7.5 mg  7.5 mg Oral Q breakfast Saundra Shelling, MD   7.5 mg at 12/01/15 0850  . senna (SENOKOT) tablet 8.6 mg  1 tablet Oral Daily PRN Theodoro Grist, MD      . sodium chloride 0.9 % injection 3 mL  3 mL Intravenous Q12H Saundra Shelling, MD   3 mL at 12/01/15 2203  . sodium chloride 0.9 % injection 3 mL  3 mL Intravenous PRN Saundra Shelling, MD      . traMADol (ULTRAM) tablet 50 mg  50 mg Oral Q4H PRN Saundra Shelling, MD   50 mg at 12/01/15 0455  . vitamin C (ASCORBIC ACID) tablet 1,000 mg  1,000 mg Oral Daily Saundra Shelling, MD   1,000 mg at 12/01/15 [id number]      Review of Systems A complete review of systems was asked and was negative except for the following positive findings increasing shortness of breath prior to admission, improving chronic wounds on her feet and  sacrum. Functioning stoma  Blood pressure 115/49, pulse 95, temperature 98.6 F (37 C), temperature source Oral, resp. rate 25, height 5\' 6"  (1.676 m), weight 200 lb 9.9 oz (91 kg), SpO2 99 %.  Physical Exam CONSTITUTIONAL:  Pleasant morbidly obese female in no acute distress.  She is awake but somewhat lethargic. She is slow to respond to questions. EYES: Pupils equal and reactive to light, Sclera non-icteric RESPIRATORY:  Lungs were rhonchorous bilaterally  Normal respiratory effort without pathologic use of accessory muscles of respiration CARDIOVASCULAR: Heart was regular without murmurs.  There were no carotid bruits. GI: The abdomen was soft, nontender, and nondistended. There were no palpable masses. There was no hepatosplenomegaly. There were normal bowel sounds in all quadrants.  There was a functioning stoma present in the left abdomen. GU:  Rectal deferred.     SKIN:  There were no pathologic skin lesions.  There were no nodules on palpation.  There is a sacral decubitus which is clean with granulation tissue present. It is irregular in its size and shape. In addition there is a calcaneal wound which also is clean and dry. There is packing present. There is a pressure ulcer on the medial malleolus as well on the left lower extremity.  Data Reviewed CT scan and chest x-ray  I have personally reviewed the patient's imaging, laboratory findings and medical records.    Assessment  This patient has a newly diagnosed right-sided pleural effusion. The initial evaluation does not suggest any evidence of infection or malignancy. However the analysis is not complete.    Plan    I would recommend that we continue to monitor her pleural effusion by physical exam. Her lungs are very rhonchorous bilaterally and it's difficult to appreciate any obvious pleural effusion. I would not recommend any surgical intervention at this time. [place] I recommend a Pleurx catheter as her multiple body  wounds would preclude successful placement.       Nestor Lewandowsky, MD 12/02/2015, 8:55 AM

## 2015-12-03 DIAGNOSIS — N390 Urinary tract infection, site not specified: Secondary | ICD-10-CM

## 2015-12-03 LAB — BASIC METABOLIC PANEL
Anion gap: 6 (ref 5–15)
BUN: 33 mg/dL — ABNORMAL HIGH (ref 6–20)
CHLORIDE: 101 mmol/L (ref 101–111)
CO2: 27 mmol/L (ref 22–32)
CREATININE: 0.85 mg/dL (ref 0.44–1.00)
Calcium: 9.2 mg/dL (ref 8.9–10.3)
GFR calc non Af Amer: >60 mL/min (ref >60)
Glucose, Bld: 86 mg/dL (ref 65–99)
POTASSIUM: 4.3 mmol/L (ref 3.5–5.1)
Sodium: 134 mmol/L — ABNORMAL LOW (ref 135–145)

## 2015-12-03 LAB — URINE CULTURE: Culture: >=100000

## 2015-12-03 LAB — IGG, IGA, IGM
IGG (IMMUNOGLOBIN G), SERUM: 1398 mg/dL (ref 700–1600)
IgA: 386 mg/dL — ABNORMAL HIGH (ref 87–352)
IgM, Serum: 233 mg/dL — ABNORMAL HIGH (ref 26–217)

## 2015-12-03 LAB — GLUCOSE, CAPILLARY
GLUCOSE-CAPILLARY: 88 mg/dL (ref 65–99)
GLUCOSE-CAPILLARY: 132 mg/dL — AB (ref 65–99)
GLUCOSE-CAPILLARY: 89 mg/dL (ref 65–99)
GLUCOSE-CAPILLARY: 97 mg/dL (ref 65–99)

## 2015-12-03 LAB — ANTI-SMOOTH MUSCLE ANTIBODY, IGG: F-ACTIN AB IGG: 46 U — AB (ref 0–19)

## 2015-12-03 LAB — CERULOPLASMIN: Ceruloplasmin: 33.6 mg/dL (ref 19.0–39.0)

## 2015-12-03 LAB — MITOCHONDRIAL ANTIBODIES: Mitochondrial M2 Ab, IgG: 9.9 Units (ref 0.0–20.0)

## 2015-12-03 LAB — HEPATITIS PANEL, ACUTE
HCV Ab: <0.1 s/co ratio (ref 0.0–0.9)
HEP B C IGM: NEGATIVE
Hep A IgM: NEGATIVE
Hepatitis B Surface Ag: NEGATIVE

## 2015-12-03 LAB — FANA STAINING PATTERNS

## 2015-12-03 LAB — ALPHA-1 ANTITRYPSIN PHENOTYPE: A-1 Antitrypsin, Ser: 194 mg/dL (ref 90–200)

## 2015-12-03 LAB — ANTINUCLEAR ANTIBODIES, IFA: ANTINUCLEAR ANTIBODIES, IFA: POSITIVE — AB

## 2015-12-03 LAB — HEMOGLOBIN: Hemoglobin: 8 g/dL — ABNORMAL LOW (ref 12.0–16.0)

## 2015-12-03 MED ORDER — LISINOPRIL 5 MG PO TABS
5.0000 mg | ORAL_TABLET | Freq: Every day | ORAL | Status: DC
  Administered 2015-12-04 – 2015-12-05 (×2): 5 mg via ORAL
  Filled 2015-12-03 (×2): qty 1

## 2015-12-03 MED ORDER — SODIUM CHLORIDE 0.9 % IV SOLN: 500.0000 mg | INTRAVENOUS | Status: DC

## 2015-12-03 NOTE — Progress Notes (Signed)
[facility] [place] NAME: [facility]    MR#:  [id number]  DATE OF BIRTH:  15-Jan-1946  SUBJECTIVE:  CHIEF COMPLAINT:   Chief Complaint  Patient presents with  . Fever   the patient is 70 year old female who presents to the hospital with fever and chills, fall smelling urine and drainage around the catheter. Patient also was noted to have right pleural effusion , for which she underwent 1.7 L thoracentesis 10th of January,  nonhealing ulcer in the left heel, bladder stones. Admits of intermittent dysphagia symptoms. Patient has not been ambulatory for the past 3 years and would like to start walking again. Complained of bilateral flank pains before coming to emergency room. Chest x-ray done today revealed reaccumulation of pleural fluid in the right chest in about 8-12 hours after thoracentesis. Thoracic surgeon does not recommend Peridex catheter placement. Patient was transferred to CCU due to somnolence and retention of CO2 for BiPAP, however, refuses BiPAP. She was diuresed and her condition improved, now she is off oxygen  and denies any pain or shortness of breath. Urinary cultures came back positive for ESBL Escherichia coli, now on meropenem. Patient feels good today. Urologist would get cystoscopy done with bladder stone removal if patient still doing poorly next week.   Review of Systems  Constitutional: Positive for malaise/fatigue. Negative for fever, chills and weight loss.  HENT: Negative for congestion.   Eyes: Negative for blurred vision and double vision.  Respiratory: Positive for shortness of breath. Negative for cough, sputum production and wheezing.   Cardiovascular: Negative for chest pain, palpitations, orthopnea, leg swelling and PND.  Gastrointestinal: Negative for nausea, vomiting, abdominal pain, diarrhea, constipation and blood in stool.  Genitourinary: Negative for dysuria, urgency, frequency and hematuria.   Musculoskeletal: Negative for falls.  Neurological: Negative for dizziness, tremors, focal weakness and headaches.  Endo/Heme/Allergies: Does not bruise/bleed easily.  Psychiatric/Behavioral: Negative for depression. The patient does not have insomnia.     VITAL SIGNS: Blood pressure 113/43, pulse 68, temperature 97.5 F (36.4 C), temperature source Oral, resp. rate 18, height 5\' 6"  (1.676 m), weight 87.998 kg (194 lb), SpO2 98 %.  PHYSICAL EXAMINATION:   GENERAL:  70 y.o.-year-old patient lying in the bed with no acute distress. alert today, answers questions appropriately EYES: Pupils equal, round, reactive to light and accommodation. No scleral icterus. Extraocular muscles intact.  HEENT: Head atraumatic, normocephalic. Oropharynx and nasopharynx clear.  NECK:  Supple, no jugular venous distention. No thyroid enlargement, no tenderness.  LUNGS: diminished breath sounds on the right at the base with relatively good air entrance area from the mid lung her up words on the right side, , relatively good air entrance on the left, no wheezing, rales,rhonchi or crepitation on the left. Not  using  accessory muscles of respiration. Patient is positioned on the right side of her chest CARDIOVASCULAR: S1, S2 normal. No murmurs, rubs, or gallops.  ABDOMEN: Soft, mild discomfort , diffusely but mostly in suprapubic area on palpation but no rebound or guarding, nondistended. Bowel sounds present. No organomegaly or mass.  EXTREMITIES: No pedal edema, cyanosis, or clubbing. Left heel ulcer was noted with mild serous drainage, no faul smell noted NEUROLOGIC: Cranial nerves II through XII are intact. Muscle strength 5/5 in all extremities. Sensation intact. Gait not checked.  PSYCHIATRIC: The patient is more alert,, oriented x 3.  SKIN: No obvious rash, lesion, or ulcer, except ulceration and left heel. Some cyanotic feet bilaterally  with diminished bilateral pulses in the lower extremities. Dressing  displaced in the left buttock area, no drainage  ORDERS/RESULTS REVIEWED:   CBC  Recent Labs Lab 11/29/15 2119 12/03/15 0529  WBC 4.4  --   HGB 8.9* 8.0*  HCT 28.8*  --   PLT 208  --   MCV 76.9*  --   MCH 23.9*  --   MCHC 31.0*  --   RDW 18.6*  --   LYMPHSABS 0.6*  --   MONOABS 0.2  --   EOSABS 0.0  --   BASOSABS 0.0  --    ------------------------------------------------------------------------------------------------------------------  Chemistries   Recent Labs Lab 11/29/15 2119 11/30/15 0716 12/01/15 0635 12/02/15 0635 12/03/15 0529  NA 137 134* 134* 135 134*  K 3.7 3.9  --  3.8 4.3  CL 104 105  --   --  101  CO2 26 25  --   --  27  GLUCOSE 115* 116*  --   --  86  BUN 27* 26*  --   --  33*  CREATININE 0.62 0.67  --   --  0.85  CALCIUM 10.2 9.6  --   --  9.2  AST 14* 12*  --   --   --   ALT 10* 9*  --   --   --   ALKPHOS 65 61  --   --   --   BILITOT 0.6 0.4  --   --   --    ------------------------------------------------------------------------------------------------------------------ estimated creatinine clearance is 69.8 mL/min (by C-G formula based on Cr of 0.85). ------------------------------------------------------------------------------------------------------------------ No results for input(s): TSH, T4TOTAL, T3FREE, THYROIDAB in the last 72 hours.  Invalid input(s): FREET3  Cardiac Enzymes No results for input(s): CKMB, TROPONINI, MYOGLOBIN in the last 168 hours.  Invalid input(s): CK ------------------------------------------------------------------------------------------------------------------ Invalid input(s): POCBNP ---------------------------------------------------------------------------------------------------------------  RADIOLOGY: No results found.  EKG:  Orders placed or performed during the hospital encounter of 11/29/15  . ED EKG 12-Lead  . ED EKG 12-Lead    ASSESSMENT AND PLAN:  Principal Problem:   UTI (lower  urinary tract infection) Active Problems:   Dyspnea   Hepatic cirrhosis ([place]) 1. Acute pyelonephritis due to Proteus mirabilis  and Escherichia coli ESBL due to chronic indwelling Foley catheter, blood cultures are negative so far, continue patient on  Merpenem, PICC line is placed  2. Peripheral vascular disease, appreciate vascular surgery input, apparently patient had recent vascular evaluation, symptoms were felt to be due to systemic medical disease,  3. Left heel ulcer, appreciate podiatry input, also is chronic and does not require any intervention 4 . Hyponatremia, improved when Patient was initiated on Lasix, , now hyponatremic again , follow closely sodium level . Marland Kitchen  5. Bladder stones, discussed with Dr. Erlene Quan, urologist, was able intervention next week if patient remains septic or does poorly,   continue . Meropenem intravenously, likely discharged on ertapenem 6.  Right pleural effusion, likely due to liver cirrhosis and ascites , status post thoracentesis January 10th 2017, pleural fluid cytology is  pending. . It is exudate, echocardiogram i reveals ejection fraction of 40-45% , wall motion abnormalities. In apical as well as anterior walls , mild mitral and moderate tricuspid regurgitation , continue Lasix, spironolactone, appreciate  Dr. Genevive Bi input 7. Dysphagia continue dysphagia 3 diet , appreciate speech  Therapist input 8. Failure to thrive adult , getting palliative care involved for further discussion with family  9. Altered mental status, somnolence, due to CO2 retention ,resolved now  Ammonia level was  normal at 20  10. Liver cirrhosis, getting hepatitis panel, further workup by gastroenterologis obtained 11. Acute on chronic systolic CHF, continue patient on Lasix,  initiate ACE inhibitor , follow closely Management plans discussed with the patient, family and they are in agreement.   DRUG ALLERGIES:  Allergies  Allergen Reactions  . Macrobid [Nitrofurantoin Monohyd  Macro] Itching, Rash and Swelling    PT HOSPITALIZED  . Penicillins Shortness Of Breath, Rash and Other (See Comments)    Has patient had a PCN reaction causing immediate rash, facial/tongue/throat swelling, SOB or lightheadedness with hypotension: Yes Has patient had a PCN reaction causing severe rash involving mucus membranes or skin necrosis: No Has patient had a PCN reaction that required hospitalization Yes Has patient had a PCN reaction occurring within the last 10 years: No If all of the above answers are "NO", then may proceed with Cephalosporin use.   . Tape Rash  . Azithromycin Other (See Comments)    Reaction: unknown   . Clarithromycin Other (See Comments)    Abdominal pain  . Nitroglycerin Other (See Comments)    Reaction - unknown  . Leflunomide Rash  . Morphine Nausea And Vomiting    Nausea and vomiting  . Naproxen Rash    CODE STATUS:     Code Status Orders        Start     Ordered   11/30/15 0310  Full code   Continuous     11/30/15 0309    Code Status History    Date Active Date Inactive Code Status Order ID Comments User Context   06/12/2015  3:47 AM 06/18/2015  5:34 PM Full Code [id number]  Juluis Mire, MD Inpatient      TOTAL CRITICAL CARE TIME TAKING CThis patient 40 minutes  Discussed with patient's family Liberta Gimpel M.D on 12/03/2015 at 3:28 PM  Between 7am to 6pm - Pager - [phone]  After 6pm go to [email or web address] - password [email or web address] [facility]  Office  [phone]  CC: Primary care physician; Juluis Pitch, MD

## 2015-12-03 NOTE — Consult Note (Signed)
Pharmacy Antibiotic Follow-up Note  Suzanne Ewing is a 70 y.o. year-old female admitted on 11/29/2015.  The patient is currently on day 2 of Meropenem 1 gm IV q8h.   Assessment/Plan: Continue current dose of Meropenem 1 gm IV q8h based on renal function.   Temp (24hrs), Avg:98.4 F (36.9 C), Min:97.5 F (36.4 C), Max:98.7 F (37.1 C)   Recent Labs Lab 11/29/15 2119  WBC 4.4     Recent Labs Lab 11/29/15 2119 11/30/15 0716 12/03/15 0529  CREATININE 0.62 0.67 0.85   Estimated Creatinine Clearance: 69.8 mL/min (by C-G formula based on Cr of 0.85).    Allergies  Allergen Reactions  . Macrobid [Nitrofurantoin Monohyd Macro] Itching, Rash and Swelling    PT HOSPITALIZED  . Penicillins Shortness Of Breath, Rash and Other (See Comments)    Has patient had a PCN reaction causing immediate rash, facial/tongue/throat swelling, SOB or lightheadedness with hypotension: Yes Has patient had a PCN reaction causing severe rash involving mucus membranes or skin necrosis: No Has patient had a PCN reaction that required hospitalization Yes Has patient had a PCN reaction occurring within the last 10 years: No If all of the above answers are "NO", then may proceed with Cephalosporin use.   . Tape Rash  . Azithromycin Other (See Comments)    Reaction: unknown   . Clarithromycin Other (See Comments)    Abdominal pain  . Nitroglycerin Other (See Comments)    Reaction - unknown  . Leflunomide Rash  . Morphine Nausea And Vomiting    Nausea and vomiting  . Naproxen Rash    Antimicrobials this admission: 1/9 levofloxacin >> 1/11 1/11 gentamicin >> 1/11 1/11 meropenem >>  Levels/dose changes this admission:   Microbiology results: BCx: ngtd  UCx: 50,000 ESBL E. Coli, >100,000 Proteus Mirabilis MRSA PCR: neg Bodily fluid: ngtd  Thank you for allowing pharmacy to be a part of this patient's care.  Suzanne Ewing G PharmD 12/03/2015 12:11 PM

## 2015-12-03 NOTE — Progress Notes (Signed)
Nutrition Follow-up   INTERVENTION:   Meals and Snacks: Cater to patient preferences; SLP following on dysphagia III Medical Food Supplement Therapy: Continue Prostat will also add Magic Cup BID as pt likes ice cream, for added protein Coordination of Care: will recommend collecting daily weights   NUTRITION DIAGNOSIS:   Inadequate oral intake related to acute illness as evidenced by per patient/family report.  GOAL:   Patient will meet greater than or equal to 90% of their needs  MONITOR:    (Energy intake, Digestive system)  REASON FOR ASSESSMENT:    (pressure ulcer stage II)    ASSESSMENT:    Pt s/p 1.7L removal via thoracentesis on 1/10. Pt remains on sizewise bed with stage III pressure ulcer to sacrum.  Diet Order:  DIET DYS 3 Room service appropriate?: Yes with Assist; Fluid consistency:: Thin    Current Nutrition: Pt ate 85% of breakfast this am including french toast and coffee. Pt reports appetite is doing fair. RD notes pt ate 75-80% of meals yesterday. Pt reports PTA appetite was not good a few days PTA because she was not feeling well, not sure exactly how long as pt reports it started 'when my cultures were sent' presumably at [facility] PTA. RD notes husband previously reported that wife was eating bites for the week PTA. Pt reports eating 3 meals per day previously with a good appetite. Pt confirms she does not like Ensure/Boost supplements. Pro-stat at bedside, pt had not taken yet this am; per [facility] pt has been taking one per day.    Gastrointestinal Profile: Last BM: per chart review none to scant amounts of stool output via ostomy charted since admission, RN Hinton Dyer made aware   Scheduled Medications:  . albuterol  2.5 mg Nebulization Q6H  . antiseptic oral rinse  7 mL Mouth Rinse q12n4p  . aspirin EC  81 mg Oral Daily  . [START ON 12/16/2015] cyanocobalamin  1,000 mcg Intramuscular Q30 days  . diltiazem  120 mg Oral BID  . enoxaparin (LOVENOX)  injection  40 mg Subcutaneous Q24H  . escitalopram  10 mg Oral Daily  . etodolac  400 mg Oral BID  . feeding supplement (PRO-STAT SUGAR FREE 64)  30 mL Oral BID  . ferrous sulfate  325 mg Oral TID  . folic acid  1 mg Oral Daily  . furosemide  20 mg Oral BID  . guaiFENesin  600 mg Oral BID  . insulin aspart  0-9 Units Subcutaneous TID WC  . insulin aspart  3 Units Subcutaneous TID WC  . insulin glargine  15 Units Subcutaneous Daily  . meropenem (MERREM) IV  1 g Intravenous 3 times per day  . metFORMIN  500 mg Oral Q breakfast  . metoprolol tartrate  25 mg Oral BID  . mometasone-formoterol  2 puff Inhalation BID  . multivitamin with minerals  1 tablet Oral Daily  . omega-3 acid ethyl esters  1 g Oral Daily  . oxybutynin  5 mg Oral BID  . pantoprazole  40 mg Oral Daily  . pentoxifylline  400 mg Oral TID WC  . potassium chloride  20 mEq Oral BID  . predniSONE  7.5 mg Oral Q breakfast  . sodium chloride  3 mL Intravenous Q12H  . spironolactone  25 mg Oral BID  . tiotropium  18 mcg Inhalation Daily  . ascorbic acid  1,000 mg Oral Daily     Electrolyte/Renal Profile and Glucose Profile:   Recent Labs Lab 11/29/15 2119  11/30/15 0716 12/01/15 0635 12/02/15 0635 12/03/15 0529  NA 137 134* 134* 135 134*  K 3.7 3.9  --  3.8 4.3  CL 104 105  --   --  101  CO2 26 25  --   --  27  BUN 27* 26*  --   --  33*  CREATININE 0.62 0.67  --   --  0.85  CALCIUM 10.2 9.6  --   --  9.2  GLUCOSE 115* 116*  --   --  86   Protein Profile:   Recent Labs Lab 11/29/15 2119 11/30/15 0716  ALBUMIN 2.8* 2.5*    Hepatic Profile:  Hepatic Function Latest Ref Rng 11/30/2015 11/29/2015 06/16/2015  Total Protein 6.5 - 8.1 g/dL 6.8 7.2 6.2(L)  Albumin 3.5 - 5.0 g/dL 2.5(L) 2.8(L) 1.9(L)  AST 15 - 41 U/L 12(L) 14(L) 18  ALT 14 - 54 U/L 9(L) 10(L) 15  Alk Phosphatase 38 - 126 U/L 61 65 114  Total Bilirubin 0.3 - 1.2 mg/dL 0.4 0.6 0.5      Nutrition-Focused Physical Exam Findings:  Nutrition-Focused physical exam completed. Findings are no to mild fat depletion of triceps however difficult to assess triceps fully and thoracic region secondary to positioning in bed and pts ability, mild-moderate muscle depletion, and non-pitting edema. RD unable to fully assess lower extremities either.    Weight Trend since Admission: Filed Weights   11/29/15 2115 12/01/15 2015 12/03/15 1000  Weight: 164 lb 14.4 oz (74.798 kg) 200 lb 9.9 oz (91 kg) 194 lb (87.998 kg)   Weight Trend Previously: Pt reports weight of 160lbs 3 weeks PTA. RD notes weight on admission 11/29/2015 164lbs. Current measured weight with Nsg this am of 194lbs.   Skin:   Stage III pressure ulcer to sacrum, left heel ulcer chronic   BMI:  Body mass index is 31.33 kg/(m^2).  Estimated Nutritional Needs:   Kcal:  BEE 1281 kcals (IF 1.0-1.3, AF 1.2) 0122-2411 kcals.d.   Protein:  (1.0-1.2 g/kg) 74-89 g/d  Fluid:  (25-77m/kg) 1850-22231md  EDUCATION NEEDS:   No education needs identified at this time   MOCypressRD, LDN Pager ([phone]/On-Call Pager ([phone]

## 2015-12-03 NOTE — Progress Notes (Signed)
Per [facility] liaison patient has used her Medicare days as of 11/12/15. Per [facility] application is pending. Suzanne Ewing reported that patient can return to [facility] if a bed is available. CSW met with patient and her husband was at bedside. Husband requested for CSW to call their daughter Suzanne Ewing [phone]. CSW contacted Suzanne Ewing and made her aware of above. Per Suzanne Ewing she is agreeable to SNF search in case [facility] does not have a bed. Per Suzanne Ewing she would like for patient to return to [facility]. Suzanne Ewing voiced her concerns about the MD at [facility]. CSW notified Suzanne Ewing of those concerns. CSW discussed case with RN Case Manager. Patient will likely not be ready for D/C over the weekend. CSW will continue to follow and assist as needed.   Suzanne Ewing, [place] [phone]

## 2015-12-03 NOTE — Progress Notes (Signed)
Urology F/U:  Briefly in ICU, now back on floor.  Clinically improving.  UCx c/w Proteus and ESBL E. Coli.    Patient was seen today but not examined. Her husband was at the bedside.  Antibiotics currently meropenem for appropriate coverage.    Plan to see patient again on Monday if she remains in house. We will assess at that point whether to treat bladder stones while inpatient pending overall dispo while on IV antibiotics or 2 have her follow-up as an outpatient with either myself or her urologist Dr. Bernardo Heater.  She and her husband were agreeable with this plan.

## 2015-12-03 NOTE — Care Management Important Message (Signed)
Important Message  Patient Details  Name: Suzanne Ewing MRN: [id number] Date of Birth: [birth year]   Medicare Important Message Given:  Yes    Shelbie Ammons, RN 12/03/2015, 10:44 AM

## 2015-12-03 NOTE — Progress Notes (Signed)
Speech Language Pathology Treatment: Dysphagia  Patient Details Name: Suzanne E[facility] MRN: [id number] DOB: 1946/05/16 Today's Date: 12/03/2015 Time: 9470-9628 SLP Time Calculation (min) (ACUTE ONLY): 25 min  Assessment / Plan / Recommendation Clinical Impression  Pt appears to be adequately tolerating her current dys. 3 diet w/ thin liquids w/ no overt s/s of aspiration reported by pt/family nor noted by [facility] staff. NSG also reported pt was tolerating meds w/ liquid and puree for larger pills. Pt appears more alert and verbally engaged today than at eval; stronger and eager to go home. Thoroughly discussed and modeled general aspiration precautions and appropriate positioning upright for eating/drinking (to lessen risk for aspiration d/t reclined positioning during oral intake). Family member educated as well. Rec. Food options easier to eat when not feeling like eating such as cereal. Pt appears at her baseline and no further skilled ST services indicated at this time. NSG to reconsult if any change in status. Pt and family agreed.   HPI HPI: Pt is a 70 y.o. female with a known history of GERD, HTN, arthritis, recurrent urinary tract infections, chronic indwelling Foley catheter, diabetes mellitus, decubitus ulcer, heel ulcer, coronary artery disease, hyperlipidemia was referred from [facility] for fever and chills. Patient has chronic indwelling Foley catheter. Catheter was changed yesterday after she had some foul-smelling urine and drainage around the catheter. Since yesterday evening patient had some low-grade fever and chills. Patient was managed for urinary tract infection in the past and received IV antibiotics. According to the family member patient has history of bilateral kidney stones and they were concerned that this was causing the infection. Patient has nonhealing ulcer in the left heel also has skin breakdown in the left malleolus. Patient gets wound care at the nursing home. She  is on oxygen at the nursing home and nasal cannula. No history of chest pain. No complaints of any shortness of breath. Generalized weakness fatigue. Family present and denied any s/s of dysphagia at the facility; pt's husband stated pt has not taken much po's in the past week. Currently, she is on a dsy. 3 diet w/ no c/o difficulty swallowing or eating/drinking at meals. NSG reported toelration of diet and taking pills w/ both liquid and applesauce (for larger pills). Reviewed chart notes of labs and temps.      SLP Plan  All goals met     Recommendations  Diet recommendations: Dysphagia 3 (mechanical soft);Thin liquid Liquids provided via: Cup;Straw Medication Administration: Whole meds with puree (as nec.) Supervision: Patient able to self feed;Intermittent supervision to cue for compensatory strategies Compensations: Minimize environmental distractions;Slow rate;Small sips/bites;Follow solids with liquid Postural Changes and/or Swallow Maneuvers: Seated upright 90 degrees;Upright 30-60 min after meal (GERD)             General recommendations:  (none) Oral Care Recommendations: Oral care BID;Staff/trained caregiver to provide oral care Follow up Recommendations: None Plan: All goals met     GO               Orinda Kenner, MS, CCC-SLP  Watson,Katherine 12/03/2015, 4:52 PM

## 2015-12-04 DIAGNOSIS — K9419 Other complications of enterostomy: Secondary | ICD-10-CM

## 2015-12-04 LAB — BODY FLUID CULTURE: Culture: NO GROWTH

## 2015-12-04 LAB — CULTURE, BLOOD (ROUTINE X 2)
Culture: NO GROWTH
Culture: NO GROWTH

## 2015-12-04 LAB — SODIUM: Sodium: 137 mmol/L (ref 135–145)

## 2015-12-04 LAB — GLUCOSE, CAPILLARY
GLUCOSE-CAPILLARY: 131 mg/dL — AB (ref 65–99)
GLUCOSE-CAPILLARY: 117 mg/dL — AB (ref 65–99)
GLUCOSE-CAPILLARY: 224 mg/dL — AB (ref 65–99)
Glucose-Capillary: 69 mg/dL (ref 65–99)
Glucose-Capillary: 66 mg/dL (ref 65–99)
Glucose-Capillary: 130 mg/dL — ABNORMAL HIGH (ref 65–99)

## 2015-12-04 LAB — HEMOGLOBIN: HEMOGLOBIN: 8.9 g/dL — AB (ref 12.0–16.0)

## 2015-12-04 MED ORDER — ALBUTEROL SULFATE (2.5 MG/3ML) 0.083% IN NEBU: 2.5000 mg | INHALATION_SOLUTION | Freq: Four times a day (QID) | RESPIRATORY_TRACT | Status: DC | PRN

## 2015-12-04 MED ORDER — CALCIUM CARBONATE ANTACID 500 MG PO CHEW
1.0000 | CHEWABLE_TABLET | ORAL | Status: DC | PRN
  Administered 2015-12-04: 200 mg via ORAL
  Filled 2015-12-04: qty 1

## 2015-12-04 NOTE — Plan of Care (Signed)
Problem: Physical Regulation: Goal: Ability to maintain clinical measurements within normal limits will improve Outcome: Progressing In am CBG 69, after rechecked 66. 4OZ OJ given and breakfast CBG up to 131. MD notified, lantus and metformin held. Colostomy leaked, noted small amt of bleeding from stoma. Dr Burt Knack notified, no new orders. Colostomy care, wafer and colostomy bag changed.

## 2015-12-04 NOTE — Consult Note (Signed)
Surgical Consultation  12/04/2015  Suzanne Ewing is an 70 y.o. female.   CC: Bleeding from colostomy  HPI: I was asked see this patient by the ward clerk who was reading from the order for consult. There is no mention of the colostomy bleeding and any of the recent notes nor was there any phone call from the provider who requested the consult. Patient describes occasional minimal bleeding from the edge of the colostomy since she has had the colostomy which was placed for diversion for hygiene of her decubitus. Husband is present and confirms this. She also states that there is breakdown of the skin and at the ostomy appliance does not fit well. I have attempted to contact the nurse caring for the patient but she is not available.  Past Medical History  Diagnosis Date  . Arthritis   . Anemia   . GERD (gastroesophageal reflux disease)   . Heart disease   . Hypertension   . Nephrolithiasis   . Heel ulcer ([place])   . Diabetes mellitus without complication ([place])   . Hyperlipidemia   . Coronary artery disease     Past Surgical History  Procedure Laterality Date  . Coronary artery bypass graft    . Back surgery    . Cholecystectomy    . Shoulder surgery    . Knee surgery Left   . Carpal tunnel release Bilateral   . Joint replacement      Family History  Problem Relation Age of Onset  . Stroke Mother   . Diabetes type II Sister   . Hypertension Sister     Social History:  reports that she quit smoking about 18 months ago. She has never used smokeless tobacco. She reports that she does not drink alcohol or use illicit drugs.  Allergies:  Allergies  Allergen Reactions  . Macrobid [Nitrofurantoin Monohyd Macro] Itching, Rash and Swelling    PT HOSPITALIZED  . Penicillins Shortness Of Breath, Rash and Other (See Comments)    Has patient had a PCN reaction causing immediate rash, facial/tongue/throat swelling, SOB or lightheadedness with hypotension: Yes Has patient had a PCN  reaction causing severe rash involving mucus membranes or skin necrosis: No Has patient had a PCN reaction that required hospitalization Yes Has patient had a PCN reaction occurring within the last 10 years: No If all of the above answers are "NO", then may proceed with Cephalosporin use.   . Tape Rash  . Azithromycin Other (See Comments)    Reaction: unknown   . Clarithromycin Other (See Comments)    Abdominal pain  . Nitroglycerin Other (See Comments)    Reaction - unknown  . Leflunomide Rash  . Morphine Nausea And Vomiting    Nausea and vomiting  . Naproxen Rash    Medications reviewed.   Review of Systems:   Review of Systems  Unable to perform ROS: age     Physical Exam:  BP 124/50 mmHg  Pulse 94  Temp(Src) 98.1 F (36.7 C) (Oral)  Resp 18  Ht 5' 6"  (1.676 m)  Wt 182 lb (82.555 kg)  BMI 29.39 kg/m2  SpO2 100%  Physical Exam  Constitutional: No distress.  HENT:  Head: Normocephalic and atraumatic.  Eyes: Right eye exhibits no discharge. Left eye exhibits no discharge. No scleral icterus.  Abdominal: Soft. She exhibits no distension. There is no tenderness. There is no rebound and no guarding.  Ostomy on left side of abdomen appliance is leaking inspection demonstrates no obvious active  bleeding. There are some mild excoriations along the edge of the corrosive.  Musculoskeletal: Normal range of motion. She exhibits edema.  Skin: She is not diaphoretic.  Vitals reviewed.     Results for orders placed or performed during the hospital encounter of 11/29/15 (from the past 48 hour(s))  Hepatitis panel, acute     Status: None   Collection Time: 12/02/15  1:00 PM  Result Value Ref Range   Hepatitis B Surface Ag Negative Negative   HCV Ab <0.1 0.0 - 0.9 s/co ratio    Comment: (NOTE)                                  Negative:     < 0.8                             Indeterminate: 0.8 - 0.9                                  Positive:     > 0.9 The CDC recommends  that a positive HCV antibody result be followed up with a HCV Nucleic Acid Amplification test ([id number]). Performed At: [facility] [place], [place] [id number] Lindon Romp MD ZC:[id number]    Hep A IgM Negative Negative   Hep B C IgM Negative Negative  Glucose, capillary     Status: None   Collection Time: 12/02/15  1:32 PM  Result Value Ref Range   Glucose-Capillary 78 65 - 99 mg/dL  Glucose, capillary     Status: None   Collection Time: 12/02/15  4:47 PM  Result Value Ref Range   Glucose-Capillary 97 65 - 99 mg/dL   Comment 1 Notify RN   Alpha-1 antitrypsin phenotype     Status: None   Collection Time: 12/02/15  4:52 PM  Result Value Ref Range   A-1 Antitrypsin Pheno MM     Comment: (NOTE)       Phenotype   Population      A-1-AT Concentration                   Incidence %      Reference Interval       MM            86.5%                96 - 189       MS             8.0%                83 - 161       MZ             3.9%                60 - 111       FM             0.4%                93 - 191       SZ             0.3%                42 -  75  SS             0.1%                62 - 119       ZZ             0.05%               16 -  38       FS             0.05%               70 - 128       FZ            Unknown              44 -  88       FF            Unknown              Unknown Performed At: [facility] [place], [place] [id number] Lindon Romp MD [id number]    A-1 Antitrypsin, Ser 194 90 - 200 mg/dL  Antinuclear Antibodies, IFA     Status: Abnormal   Collection Time: 12/02/15  4:52 PM  Result Value Ref Range   ANA Ab, IFA Positive (A)     Comment: (NOTE)                                     Negative   <1:80                                     Borderline  1:80                                     Positive   >1:80 Performed At: [facility] [place], [place] [id number] Lindon Romp MD GB:[id number]   Anti-smooth muscle antibody, IgG     Status: Abnormal   Collection Time: 12/02/15  4:52 PM  Result Value Ref Range   F-Actin IgG 46 (H) 0 - 19 Units    Comment: (NOTE)                 Negative                     0 - 19                 Weak positive               20 - 30                 Moderate to strong positive     >30 Actin Antibodies are found in 52-85% of patients with autoimmune hepatitis or chronic active hepatitis and in 22% of patients with primary biliary cirrhosis. Performed At: [facility] [place], [place] [id number] Lindon Romp MD IY:[id number]   Ceruloplasmin     Status: None   Collection Time: 12/02/15  4:52 PM  Result Value Ref Range   Ceruloplasmin 33.6 19.0 - 39.0 mg/dL    Comment: (NOTE) Performed At: [facility] [place], [place]  [id number] Lindon Romp MD GS:[id number]   Ferritin     Status: None   Collection Time: 12/02/15  4:52 PM  Result Value Ref Range   Ferritin 175 11 - 307 ng/mL  Mitochondrial antibodies     Status: None   Collection Time: 12/02/15  4:52 PM  Result Value Ref Range   Mitochondrial M2 Ab, IgG 9.9 0.0 - 20.0 Units    Comment: (NOTE)                                Negative    0.0 - 20.0                                Equivocal  20.1 - 24.9                                Positive         >24.9 Mitochondrial (M2) Antibodies are found in 90-96% of patients with primary biliary cirrhosis. Performed At: [facility] [place], [place] [id number] Lindon Romp MD KM:[id number]   Iron and TIBC     Status: Abnormal   Collection Time: 12/02/15  4:52 PM  Result Value Ref Range   Iron 13 (L) 28 - 170 ug/dL   TIBC 174 (L) 250 - 450 ug/dL   Saturation Ratios 8 (L) 10.4 - 31.8 %   UIBC 161 ug/dL  Protime-INR     Status: Abnormal   Collection Time: 12/02/15  4:52 PM  Result Value Ref Range   Prothrombin Time 16.3 (H) 11.4 - 15.0  seconds   INR 1.30   IgG, IgA, IgM     Status: Abnormal   Collection Time: 12/02/15  4:52 PM  Result Value Ref Range   IgG (Immunoglobin G), Serum 1398 700 - 1600 mg/dL   IgA 386 (H) 87 - 352 mg/dL   IgM, Serum 233 (H) 26 - 217 mg/dL    Comment: (NOTE) Performed At: [facility] [place], [place] [id number] Lindon Romp MD VA:[id number]   FANA Staining Patterns     Status: None   Collection Time: 12/02/15  4:52 PM  Result Value Ref Range   Homogeneous Pattern 1:1280    Note: Comment     Comment: (NOTE) A positive ANA result may occur in healthy individuals (low titer) or be associated with a variety of diseases.  See interpretation chart which is not all inclusive: Pattern      Antigen Detected  Suggested Disease Association -----------  ----------------  ----------------------------- Homogeneous  DNA(ds,ss),       SLE - High titers             Nucleosomes,             Histones          Drug-induced SLE -----------  ----------------  ----------------------------- Speckled     Sm, RNP, SCL-70,  SLE,MCTD,PSS (diffuse form),             SS-A/SS-B         Sjogrens -----------  ----------------  ----------------------------- Nucleolar    SCL-70, PM-1/SCL  High titers Scleroderma,  PM/DM -----------  ----------------  ----------------------------- Centromere   Centromere        PSS (limited form) w/Crest                               syndrome variable -----------  ----------------  ----------------------------- Nuclear Dot  Sp100,p80-c oilin  Primary Biliary Cirrhosis -----------  ----------------  ----------------------------- Nuclear      GP210,            Primary Biliary Cirrhosis Membrane     lamin A,B,C -----------  ----------------  ----------------------------- Performed At: [facility] [place], [place] [id number] Lindon Romp MD HQ:[id number]   Glucose, capillary     Status: Abnormal    Collection Time: 12/02/15 10:18 PM  Result Value Ref Range   Glucose-Capillary 108 (H) 65 - 99 mg/dL   Comment 1 Notify RN   Basic metabolic panel     Status: Abnormal   Collection Time: 12/03/15  5:29 AM  Result Value Ref Range   Sodium 134 (L) 135 - 145 mmol/L   Potassium 4.3 3.5 - 5.1 mmol/L   Chloride 101 101 - 111 mmol/L   CO2 27 22 - 32 mmol/L   Glucose, Bld 86 65 - 99 mg/dL   BUN 33 (H) 6 - 20 mg/dL   Creatinine, Ser 0.85 0.44 - 1.00 mg/dL   Calcium 9.2 8.9 - 10.3 mg/dL   GFR calc non Af Amer >60 >60 mL/min   GFR calc Af Amer >60 >60 mL/min    Comment: (NOTE) The eGFR has been calculated using the CKD EPI equation. This calculation has not been validated in all clinical situations. eGFR's persistently <60 mL/min signify possible Chronic Kidney Disease.    Anion gap 6 5 - 15  Hemoglobin     Status: Abnormal   Collection Time: 12/03/15  5:29 AM  Result Value Ref Range   Hemoglobin 8.0 (L) 12.0 - 16.0 g/dL  Glucose, capillary     Status: None   Collection Time: 12/03/15  7:24 AM  Result Value Ref Range   Glucose-Capillary 88 65 - 99 mg/dL  Glucose, capillary     Status: Abnormal   Collection Time: 12/03/15 11:31 AM  Result Value Ref Range   Glucose-Capillary 132 (H) 65 - 99 mg/dL  Glucose, capillary     Status: None   Collection Time: 12/03/15  4:47 PM  Result Value Ref Range   Glucose-Capillary 89 65 - 99 mg/dL  Glucose, capillary     Status: None   Collection Time: 12/03/15  9:16 PM  Result Value Ref Range   Glucose-Capillary 97 65 - 99 mg/dL   Comment 1 Notify RN   Sodium     Status: None   Collection Time: 12/04/15  5:40 AM  Result Value Ref Range   Sodium 137 135 - 145 mmol/L  Hemoglobin     Status: Abnormal   Collection Time: 12/04/15  5:40 AM  Result Value Ref Range   Hemoglobin 8.9 (L) 12.0 - 16.0 g/dL  Glucose, capillary     Status: None   Collection Time: 12/04/15  8:21 AM  Result Value Ref Range   Glucose-Capillary 69 65 - 99 mg/dL  Glucose,  capillary     Status: None   Collection Time: 12/04/15  8:31 AM  Result Value Ref Range   Glucose-Capillary 66 65 - 99 mg/dL  Glucose, capillary     Status: Abnormal  Collection Time: 12/04/15  9:01 AM  Result Value Ref Range   Glucose-Capillary 131 (H) 65 - 99 mg/dL   No results found.  Assessment/Plan:  Last hemoglobin was 8.9 previous to that was 8.0. Active bleeding and no significant bleeding by history to suggest this is a GI bleed. There are some erosions or excoriations which are very mild and minor. The patient's bigger problem is that of a poorly fitting appliance. I will ask the ostomy nurse to see the patient on Monday but at this point there is no surgical need at this time  Florene Glen, MD, FACS

## 2015-12-04 NOTE — Progress Notes (Addendum)
[place] [facility] [place] NAME: Suzanne Ewing    MR#:  [id number]  DATE OF BIRTH:  03/14/1946  SUBJECTIVE:  CHIEF COMPLAINT:   Chief Complaint  Patient presents with  . Fever   the patient is 70 year old female who presents to the hospital with fever and chills, fall smelling urine and drainage around the catheter. Patient also was noted to have right pleural effusion , for which she underwent 1.7 L thoracentesis 10th of January,  nonhealing ulcer in the left heel, bladder stones. Admits of intermittent dysphagia symptoms. Patient has not been ambulatory for the past 3 years and would like to start walking again. Complained of bilateral flank pains before coming to emergency room. Chest x-ray done today revealed reaccumulation of pleural fluid in the right chest in about 8-12 hours after thoracentesis. Thoracic surgeon does not recommend Peridex catheter placement. Patient was transferred to CCU due to somnolence and retention of CO2 for BiPAP, however, refuses BiPAP. She was diuresed and her condition improved, now she is off oxygen  and denies any pain or shortness of breath. Urinary cultures came back positive for ESBL Escherichia coli, now on meropenem. Patient feels good today. Noted to have bloody looking discharge at the R side of stoma, no active bleeding, evaluated by surgery, ostomy nurse to see patient on Monday for ill fitting appliance.  Review of Systems  Constitutional: Positive for malaise/fatigue. Negative for fever, chills and weight loss.  HENT: Negative for congestion.   Eyes: Negative for blurred vision and double vision.  Respiratory: Positive for shortness of breath. Negative for cough, sputum production and wheezing.   Cardiovascular: Negative for chest pain, palpitations, orthopnea, leg swelling and PND.  Gastrointestinal: Negative for nausea, vomiting, abdominal pain, diarrhea, constipation and blood in stool.  Genitourinary:  Negative for dysuria, urgency, frequency and hematuria.  Musculoskeletal: Negative for falls.  Neurological: Negative for dizziness, tremors, focal weakness and headaches.  Endo/Heme/Allergies: Does not bruise/bleed easily.  Psychiatric/Behavioral: Negative for depression. The patient does not have insomnia.     VITAL SIGNS: Blood pressure 124/56, pulse 94, temperature 98.3 F (36.8 C), temperature source Oral, resp. rate 18, height 5\' 6"  (1.676 m), weight 82.555 kg (182 lb), SpO2 95 %.  PHYSICAL EXAMINATION:   GENERAL:  70 y.o.-year-old patient lying in the bed with no acute distress. alert today, answers questions appropriately EYES: Pupils equal, round, reactive to light and accommodation. No scleral icterus. Extraocular muscles intact.  HEENT: Head atraumatic, normocephalic. Oropharynx and nasopharynx clear.  NECK:  Supple, no jugular venous distention. No thyroid enlargement, no tenderness.  LUNGS: diminished breath sounds on the right at the base with relatively good air entrance area from the mid lung upwards on the right side, , relatively good air entrance on the left, no wheezing, rales,rhonchi or crepitation on the left. Not  using  accessory muscles of respiration. Patient is positioned on the right side of her chest CARDIOVASCULAR: S1, S2 normal. No murmurs, rubs, or gallops.  ABDOMEN: Soft, mild discomfort , diffusely but mostly in suprapubic area on palpation but no rebound or guarding, nondistended. Bowel sounds present. No organomegaly or mass. Dark blood noted at the R side of ostomy, soft stool in the bag with no blood EXTREMITIES: No pedal edema, cyanosis, or clubbing. Left heel ulcer was noted with mild serous drainage, no faul smell noted NEUROLOGIC: Cranial nerves II through XII are intact. Muscle strength 5/5 in all extremities. Sensation intact. Gait not checked.  PSYCHIATRIC: The patient is more alert,, oriented x 3.  SKIN: No obvious rash, lesion, or ulcer, except  ulceration and left heel. Some cyanotic feet bilaterally with diminished bilateral pulses in the lower extremities. Dressing displaced in the left buttock area, no drainage  ORDERS/RESULTS REVIEWED:   CBC  Recent Labs Lab 11/29/15 2119 12/03/15 0529 12/04/15 0540  WBC 4.4  --   --   HGB 8.9* 8.0* 8.9*  HCT 28.8*  --   --   PLT 208  --   --   MCV 76.9*  --   --   MCH 23.9*  --   --   MCHC 31.0*  --   --   RDW 18.6*  --   --   LYMPHSABS 0.6*  --   --   MONOABS 0.2  --   --   EOSABS 0.0  --   --   BASOSABS 0.0  --   --    ------------------------------------------------------------------------------------------------------------------  Chemistries   Recent Labs Lab 11/29/15 2119 11/30/15 0716 12/01/15 0635 12/02/15 0635 12/03/15 0529 12/04/15 0540  NA 137 134* 134* 135 134* 137  K 3.7 3.9  --  3.8 4.3  --   CL 104 105  --   --  101  --   CO2 26 25  --   --  27  --   GLUCOSE 115* 116*  --   --  86  --   BUN 27* 26*  --   --  33*  --   CREATININE 0.62 0.67  --   --  0.85  --   CALCIUM 10.2 9.6  --   --  9.2  --   AST 14* 12*  --   --   --   --   ALT 10* 9*  --   --   --   --   ALKPHOS 65 61  --   --   --   --   BILITOT 0.6 0.4  --   --   --   --    ------------------------------------------------------------------------------------------------------------------ estimated creatinine clearance is 67.6 mL/min (by C-G formula based on Cr of 0.85). ------------------------------------------------------------------------------------------------------------------ No results for input(s): TSH, T4TOTAL, T3FREE, THYROIDAB in the last 72 hours.  Invalid input(s): FREET3  Cardiac Enzymes No results for input(s): CKMB, TROPONINI, MYOGLOBIN in the last 168 hours.  Invalid input(s): CK ------------------------------------------------------------------------------------------------------------------ Invalid input(s):  POCBNP ---------------------------------------------------------------------------------------------------------------  RADIOLOGY: No results found.  EKG:  Orders placed or performed during the hospital encounter of 11/29/15  . ED EKG 12-Lead  . 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 Acute pyelonephritis due to Proteus mirabilis  and Escherichia coli ESBL due to chronic indwelling Foley catheter, blood cultures are negative, continue patient on  Merpenem, PICC line is placed, discharge on ertapenen, possible intervention by urologist during this hospitalization to remove bladder stones.   2. Peripheral vascular disease, appreciate vascular surgery input, patient had recent vascular evaluation, symptoms were felt to be due to systemic medical disease, stable at present  3. Left heel ulcer, appreciate podiatry input, felt to be chronic and did not require intervention 4 . Hyponatremia, improved when Patient was initiated on Lasix,  follow closely sodium level . Marland Kitchen  5. Bladder stones, discussed with Dr. Erlene Quan, urologist, possible urologic intervention during this hospitalization  next week by her or Dr. Bernardo Heater.  6.  Right pleural effusion, due to liver cirrhosis and ascites , status post thoracentesis January 10th 2017, pleural fluid cytology is  pending, cultures are negative . It is exudate, echocardiogram  reveals ejection fraction of 40-45% , wall motion abnormalities in the apical as well as anterior walls , mild mitral and moderate tricuspid regurgitation , continue Lasix, spironolactone, appreciate  Dr. Genevive Bi input, would benefit from cardiology evaluation as well as outpatient likely.  7. Dysphagia continue dysphagia 3 diet , appreciate speech  Therapist input 8. Failure to thrive adult , asked palliative input ,  but has not seen patient yet.  9. Altered mental status, somnolence, due to CO2 retention ,resolved now .  Ammonia level was  normal at 20  10. Liver cirrhosis, acute hepatitis panel is negative, further workup by gastroenterologis obtained, follow up with GI for input as outpatient. Continue lasix/spironolactone 11. Acute on chronic systolic CHF, continue patient on Lasix,  ACE inhibitor , improved overall 12. Colostomy area erosions, ostomy nurse to see patient on Monday, appreciate DR Burt Knack input, no active bleeding, following HGb levels closely  Management plans discussed with the patient, family and they are in agreement.   DRUG ALLERGIES:  Allergies  Allergen Reactions  . Macrobid [Nitrofurantoin Monohyd Macro] Itching, Rash and Swelling    PT HOSPITALIZED  . Penicillins Shortness Of Breath, Rash and Other (See Comments)    Has patient had a PCN reaction causing immediate rash, facial/tongue/throat swelling, SOB or lightheadedness with hypotension: Yes Has patient had a PCN reaction causing severe rash involving mucus membranes or skin necrosis: No Has patient had a PCN reaction that required hospitalization Yes Has patient had a PCN reaction occurring within the last 10 years: No If all of the above answers are "NO", then may proceed with Cephalosporin use.   . Tape Rash  . Azithromycin Other (See Comments)    Reaction: unknown   . Clarithromycin Other (See Comments)    Abdominal pain  . Nitroglycerin Other (See Comments)    Reaction - unknown  . Leflunomide Rash  . Morphine Nausea And Vomiting    Nausea and vomiting  . Naproxen Rash    CODE STATUS:     Code Status Orders        Start     Ordered   11/30/15 0310  Full code   Continuous     11/30/15 0309    Code Status History    Date Active Date Inactive Code Status Order ID Comments User Context   06/12/2015  3:47 AM 06/18/2015  5:34 PM Full Code [id number]  Juluis Mire, MD Inpatient      TOTAL CRITICAL  CARE TIME TAKING CThis patient 40 minutes  Discussed with patient's family Nezar Buckles M.D on 12/04/2015 at 3:19 PM  Between 7am to 6pm - Pager - [phone]  After 6pm go to [email or web address] - password [email or web address] [facility]  Office  [phone]  CC: Primary care physician; Juluis Pitch, MD

## 2015-12-05 LAB — GLUCOSE, CAPILLARY
GLUCOSE-CAPILLARY: 84 mg/dL (ref 65–99)
GLUCOSE-CAPILLARY: 135 mg/dL — AB (ref 65–99)
Glucose-Capillary: 175 mg/dL — ABNORMAL HIGH (ref 65–99)
Glucose-Capillary: 209 mg/dL — ABNORMAL HIGH (ref 65–99)
Glucose-Capillary: 88 mg/dL (ref 65–99)

## 2015-12-05 NOTE — Progress Notes (Signed)
[facility] [place] NAME: Suzanne Ewing    MR#:  [id number]  DATE OF BIRTH:  [birth year]  SUBJECTIVE:  CHIEF COMPLAINT:   Chief Complaint  Patient presents with  . Fever   the patient is 70 year old female who presents to the hospital with fever and chills, fall smelling urine and drainage around the catheter. Patient also was noted to have right pleural effusion , for which she underwent 1.7 L thoracentesis 10th of January,  nonhealing ulcer in the left heel, bladder stones. Admits of intermittent dysphagia symptoms. Patient has not been ambulatory for the past 3 years and would like to start walking again. Complained of bilateral flank pains before coming to emergency room. Chest x-ray done today revealed reaccumulation of pleural fluid in the right chest in about 8-12 hours after thoracentesis. Thoracic surgeon does not recommend Peridex catheter placement. Patient was transferred to CCU due to somnolence and retention of CO2 for BiPAP, however, refuses BiPAP. She was diuresed and her condition improved, now she is off oxygen  and denies any pain or shortness of breath. Urinary cultures came back positive for ESBL Escherichia coli, now on meropenem. Patient feels good today. Noted to have bloody looking discharge at the R side of stoma, no active bleeding, evaluated by surgery, ostomy nurse to see patient on Monday for ill fitting appliance.  Patient feels comfortable today. Denies any significant shortness of breath. Patient's blood glucose levels are relatively low and she is off Lantus insulin at present. Eats about 40% or less of offered meals Review of Systems  Constitutional: Positive for malaise/fatigue. Negative for fever, chills and weight loss.  HENT: Negative for congestion.   Eyes: Negative for blurred vision and double vision.  Respiratory: Positive for shortness of breath. Negative for cough, sputum production and wheezing.    Cardiovascular: Negative for chest pain, palpitations, orthopnea, leg swelling and PND.  Gastrointestinal: Negative for nausea, vomiting, abdominal pain, diarrhea, constipation and blood in stool.  Genitourinary: Negative for dysuria, urgency, frequency and hematuria.  Musculoskeletal: Negative for falls.  Neurological: Negative for dizziness, tremors, focal weakness and headaches.  Endo/Heme/Allergies: Does not bruise/bleed easily.  Psychiatric/Behavioral: Negative for depression. The patient does not have insomnia.     VITAL SIGNS: Blood pressure 136/49, pulse 91, temperature 98.5 F (36.9 C), temperature source Oral, resp. rate 18, height 5\' 6"  (1.676 m), weight 81.194 kg (179 lb), SpO2 95 %.  PHYSICAL EXAMINATION:   GENERAL:  70 y.o.-year-old patient lying in the bed with no acute distress. alert today, answers questions appropriately EYES: Pupils equal, round, reactive to light and accommodation. No scleral icterus. Extraocular muscles intact.  HEENT: Head atraumatic, normocephalic. Oropharynx and nasopharynx clear.  NECK:  Supple, no jugular venous distention. No thyroid enlargement, no tenderness.  LUNGS: diminished breath sounds on the right at the base with relatively good air entrance area from the mid lung upwards on the right side, , relatively good air entrance on the left, no wheezing, rales,rhonchi or crepitation on the left. Not  using  accessory muscles of respiration. Patient is positioned on the right side of her chest CARDIOVASCULAR: S1, S2 normal. No murmurs, rubs, or gallops.  ABDOMEN: Soft, mild discomfort , diffusely but mostly in suprapubic area on palpation but no rebound or guarding, nondistended. Bowel sounds present. No organomegaly or mass. No bleeding beside the stoma , soft stool in the bag with no blood EXTREMITIES: No pedal edema, cyanosis, or clubbing. Left heel ulcer  was noted with mild serous drainage, no faul smell noted NEUROLOGIC: Cranial nerves II  through XII are intact. Muscle strength 5/5 in all extremities. Sensation intact. Gait not checked.  PSYCHIATRIC: The patient is more alert,, oriented x 3.  SKIN: No obvious rash, lesion, or ulcer, except ulceration and left heel. Some cyanotic feet bilaterally with diminished bilateral pulses in the lower extremities. Dressing displaced in the left buttock area, no drainage  ORDERS/RESULTS REVIEWED:   CBC  Recent Labs Lab 11/29/15 2119 12/03/15 0529 12/04/15 0540  WBC 4.4  --   --   HGB 8.9* 8.0* 8.9*  HCT 28.8*  --   --   PLT 208  --   --   MCV 76.9*  --   --   MCH 23.9*  --   --   MCHC 31.0*  --   --   RDW 18.6*  --   --   LYMPHSABS 0.6*  --   --   MONOABS 0.2  --   --   EOSABS 0.0  --   --   BASOSABS 0.0  --   --    ------------------------------------------------------------------------------------------------------------------  Chemistries   Recent Labs Lab 11/29/15 2119 11/30/15 0716 12/01/15 0635 12/02/15 0635 12/03/15 0529 12/04/15 0540  NA 137 134* 134* 135 134* 137  K 3.7 3.9  --  3.8 4.3  --   CL 104 105  --   --  101  --   CO2 26 25  --   --  27  --   GLUCOSE 115* 116*  --   --  86  --   BUN 27* 26*  --   --  33*  --   CREATININE 0.62 0.67  --   --  0.85  --   CALCIUM 10.2 9.6  --   --  9.2  --   AST 14* 12*  --   --   --   --   ALT 10* 9*  --   --   --   --   ALKPHOS 65 61  --   --   --   --   BILITOT 0.6 0.4  --   --   --   --    ------------------------------------------------------------------------------------------------------------------ estimated creatinine clearance is 67.2 mL/min (by C-G formula based on Cr of 0.85). ------------------------------------------------------------------------------------------------------------------ No results for input(s): TSH, T4TOTAL, T3FREE, THYROIDAB in the last 72 hours.  Invalid input(s): FREET3  Cardiac Enzymes No results for input(s): CKMB, TROPONINI, MYOGLOBIN in the last 168 hours.  Invalid  input(s): CK ------------------------------------------------------------------------------------------------------------------ Invalid input(s): POCBNP ---------------------------------------------------------------------------------------------------------------  RADIOLOGY: No results found.  EKG:  Orders placed or performed during the hospital encounter of 11/29/15  . ED EKG 12-Lead  . 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 Acute pyelonephritis due to Proteus mirabilis  and Escherichia coli ESBL due to chronic indwelling Foley catheter, blood cultures are negative, continue patient on  Merpenem, PICC line to be placed prior to discharge to skilled nursing facility, discharge on ertapenen, possible intervention by urologist during this hospitalization to remove bladder stones next week, to be reassessed on Monday.   2. Peripheral vascular disease, appreciate vascular surgery input, patient had recent vascular evaluation, symptoms were felt to be due to systemic medical disease, stable at present  3. Left heel  ulcer, appreciate podiatry input, felt to be chronic and did not require intervention 4 . Hyponatremia, improved when Patient was initiated on Lasix,  follow closely sodium level . Marland Kitchen  5. Bladder stones, discussed with Dr. Erlene Quan, urologist, possible urologic intervention during this hospitalization next week by her or Dr. Bernardo Heater.  6.  Right pleural effusion, due to liver cirrhosis and ascites , status post thoracentesis January 10th 2017, pleural fluid cytology is  pending, cultures are negative . It is exudate, echocardiogram  reveals ejection fraction of 40-45% , wall motion abnormalities in the apical as well as anterior walls , mild mitral and moderate tricuspid regurgitation , continue Lasix,  spironolactone, appreciate  Dr. Genevive Bi input, would benefit from cardiology evaluation as outpatient .  7. Dysphagia continue dysphagia 3 diet , appreciate speech  Therapist input, will intake is minimal to 40% 8. Failure to thrive adult , asked palliative input , but has not seen patient yet.  9. Altered mental status, somnolence, due to CO2 retention ,resolved now .  Ammonia level was  normal at 20  10. Liver cirrhosis, acute hepatitis panel is negative, further workup by gastroenterologis obtained, follow up with GI for input as outpatient. Continue lasix/spironolactone, [place] , latter would help with peripheral vascular disease as well 11. Acute on chronic systolic CHF, continue patient on Lasix,  ACE inhibitor , improved overall 12. Colostomy area erosions, ostomy nurse to see patient on Monday, appreciate DR Burt Knack input, no active bleeding, following HGb level in a.m.  Management plans discussed with the patient, family and they are in agreement.   DRUG ALLERGIES:  Allergies  Allergen Reactions  . Macrobid [Nitrofurantoin Monohyd Macro] Itching, Rash and Swelling    PT HOSPITALIZED  . Penicillins Shortness Of Breath, Rash and Other (See Comments)    Has patient had a PCN reaction causing immediate rash, facial/tongue/throat swelling, SOB or lightheadedness with hypotension: Yes Has patient had a PCN reaction causing severe rash involving mucus membranes or skin necrosis: No Has patient had a PCN reaction that required hospitalization Yes Has patient had a PCN reaction occurring within the last 10 years: No If all of the above answers are "NO", then may proceed with Cephalosporin use.   . Tape Rash  . Azithromycin Other (See Comments)    Reaction: unknown   . Clarithromycin Other (See Comments)    Abdominal pain  . Nitroglycerin Other (See Comments)    Reaction - unknown  . Leflunomide Rash  . Morphine Nausea And Vomiting    Nausea and vomiting  . Naproxen Rash    CODE  STATUS:     Code Status Orders        Start     Ordered   11/30/15 0310  Full code   Continuous     11/30/15 0309    Code Status History    Date Active Date Inactive Code Status Order ID Comments User Context   06/12/2015  3:47 AM 06/18/2015  5:34 PM Full Code [id number]  Juluis Mire, MD Inpatient      TOTAL CRITICAL CARE TIME TAKING CThis patient 40 minutes  Discussed with patient's family Orvel Cutsforth M.D on 12/05/2015 at 3:26 PM  Between 7am to 6pm - Pager - [phone]  After 6pm go to [email or web address] - password [email or web address]  Office  [phone]  CC: Primary care physician; Juluis Pitch, MD

## 2015-12-05 NOTE — Plan of Care (Signed)
Problem: Physical Regulation: Goal: Ability to maintain clinical measurements within normal limits will improve Outcome: Progressing CBG's monitored. Lantus d/c. Correction insulin not administered with meals due to meal intake<50%.  Problem: Nutrition: Goal: Adequate nutrition will be maintained Outcome: Progressing Fair intake. Pt encouraged to eat. Feeding supplement given.  Problem: Bowel/Gastric: Goal: Will not experience complications related to bowel motility Outcome: Progressing Loose Green/brown stool via colostomy. No blood noted in stool. Ostomy Consultant changed colostomy appliance.

## 2015-12-05 NOTE — Consult Note (Signed)
[place] ostomy consult note Requested to evaluate appliance due to leakage, skin issue.  This patient has had colostomy greater than 6 months, she had diverting ostomy at [facility].  She has been living in SNF with assistance with ostomy.  Stoma type/location: LLQ, end colostomy Stomal assessment/size: retracted stoma with some bleeding noted at 9 o'clock.  Certainly the reason she is having leakage is that she has a stoma that is not budded and retracted below skin level. She has a large abdomen and when sitting the stoma is even more retracted. She may benefit from ostomy belt, she has not used in the past.  Peristomal assessment: >0.1cm x of denudation at 8-10 o'clock I question if this is wear the leakage occurs, she reports yes.  Treatment options for stomal/peristomal skin: used a tiny bit of ostomy powder on the slight denudation.  Continued to note bleeding at the edge of the stoma. May be from irritation at this side.  She has a stable Hgb per the bedside nurse.  Do not see active bleeding from the stoma os.  Output: liquid green, appears to have some blood.  Ostomy pouching: 1pc.convex used, with 1/2 of ostomy barrier ring used from 6-11 o'clock    I have discussed with bedside nurse.  Will report off to my [place] partner to check on pouching system tomorrow as well to see if this is going to work well.   Thailan Sava Sprague RN,CWOCN [id number]

## 2015-12-06 ENCOUNTER — Inpatient Hospital Stay: Payer: Medicare Other

## 2015-12-06 DIAGNOSIS — I70209 Unspecified atherosclerosis of native arteries of extremities, unspecified extremity: Secondary | ICD-10-CM

## 2015-12-06 DIAGNOSIS — J9601 Acute respiratory failure with hypoxia: Secondary | ICD-10-CM

## 2015-12-06 DIAGNOSIS — J90 Pleural effusion, not elsewhere classified: Secondary | ICD-10-CM

## 2015-12-06 DIAGNOSIS — R627 Adult failure to thrive: Secondary | ICD-10-CM

## 2015-12-06 DIAGNOSIS — B964 Proteus (mirabilis) (morganii) as the cause of diseases classified elsewhere: Secondary | ICD-10-CM

## 2015-12-06 DIAGNOSIS — Z1612 Extended spectrum beta lactamase (ESBL) resistance: Secondary | ICD-10-CM

## 2015-12-06 DIAGNOSIS — N210 Calculus in bladder: Secondary | ICD-10-CM

## 2015-12-06 DIAGNOSIS — K633 Ulcer of intestine: Secondary | ICD-10-CM

## 2015-12-06 DIAGNOSIS — I5023 Acute on chronic systolic (congestive) heart failure: Secondary | ICD-10-CM

## 2015-12-06 DIAGNOSIS — L97429 Non-pressure chronic ulcer of left heel and midfoot with unspecified severity: Secondary | ICD-10-CM

## 2015-12-06 DIAGNOSIS — G9341 Metabolic encephalopathy: Secondary | ICD-10-CM

## 2015-12-06 DIAGNOSIS — Z9889 Other specified postprocedural states: Secondary | ICD-10-CM

## 2015-12-06 DIAGNOSIS — R1310 Dysphagia, unspecified: Secondary | ICD-10-CM

## 2015-12-06 DIAGNOSIS — R188 Other ascites: Secondary | ICD-10-CM

## 2015-12-06 DIAGNOSIS — K7460 Unspecified cirrhosis of liver: Secondary | ICD-10-CM

## 2015-12-06 DIAGNOSIS — A498 Other bacterial infections of unspecified site: Secondary | ICD-10-CM

## 2015-12-06 DIAGNOSIS — E871 Hypo-osmolality and hyponatremia: Secondary | ICD-10-CM

## 2015-12-06 DIAGNOSIS — J9602 Acute respiratory failure with hypercapnia: Secondary | ICD-10-CM

## 2015-12-06 LAB — GLUCOSE, CAPILLARY
GLUCOSE-CAPILLARY: 68 mg/dL (ref 65–99)
GLUCOSE-CAPILLARY: 80 mg/dL (ref 65–99)
GLUCOSE-CAPILLARY: 172 mg/dL — AB (ref 65–99)

## 2015-12-06 LAB — CREATININE, SERUM
Creatinine, Ser: 0.79 mg/dL (ref 0.44–1.00)
GFR calc Af Amer: >60 mL/min (ref >60)
GFR calc non Af Amer: >60 mL/min (ref >60)

## 2015-12-06 MED ORDER — TRAMADOL HCL 50 MG PO TABS: 50.0000 mg | ORAL_TABLET | ORAL | Status: AC | PRN

## 2015-12-06 MED ORDER — TIOTROPIUM BROMIDE MONOHYDRATE 18 MCG IN CAPS
18.0000 ug | ORAL_CAPSULE | Freq: Every day | RESPIRATORY_TRACT | Status: AC
Start: 2015-12-06 — End: ?

## 2015-12-06 MED ORDER — LISINOPRIL 5 MG PO TABS
5.0000 mg | ORAL_TABLET | Freq: Every day | ORAL | Status: AC
Start: 2015-12-06 — End: ?

## 2015-12-06 MED ORDER — PENTOXIFYLLINE ER 400 MG PO TBCR: 400.0000 mg | EXTENDED_RELEASE_TABLET | Freq: Three times a day (TID) | ORAL | Status: DC

## 2015-12-06 MED ORDER — ALBUTEROL SULFATE (2.5 MG/3ML) 0.083% IN NEBU: 2.5000 mg | INHALATION_SOLUTION | Freq: Four times a day (QID) | RESPIRATORY_TRACT | Status: AC | PRN

## 2015-12-06 MED ORDER — CETYLPYRIDINIUM CHLORIDE 0.05 % MT LIQD: 7.0000 mL | Freq: Two times a day (BID) | OROMUCOSAL | Status: AC

## 2015-12-06 MED ORDER — MAGIC MOUTHWASH: 10.0000 mL | Freq: Four times a day (QID) | ORAL | Status: AC | PRN

## 2015-12-06 MED ORDER — SODIUM CHLORIDE 0.9 % IV SOLN: 500.0000 mg | INTRAVENOUS | Status: DC

## 2015-12-06 NOTE — NC FL2 (Signed)
[place] LEVEL OF CARE SCREENING TOOL     IDENTIFICATION  Patient Name: Suzanne Ewing Birthdate: 05-11-1946 Sex: female Admission Date (Current Location): 11/29/2015  [place] and [place] Number:  Engineering geologist and Address:  [facility], [address] [place], [place] [address]      Provider Number: [id number]  Attending Physician Name and Address:  Theodoro Grist, MD  Relative Name and Phone Number:       Current Level of Care: Hospital Recommended Level of Care: [place] Prior Approval Number:    Date Approved/Denied:   PASRR Number:  ([id number])  Discharge Plan: SNF    Current Diagnoses: Patient Active Problem List   Diagnosis Date Noted  . Proteus (mirabilis) ([place]) as the cause of diseases classified elsewhere 12/06/2015  . Infection due to ESBL-producing Escherichia coli 12/06/2015  . Atherosclerotic peripheral vascular disease ([place]) 12/06/2015  . Ulcer of left heel ([place]) 12/06/2015  . Bladder stones 12/06/2015  . Pleural effusion, right 12/06/2015  . S/P thoracentesis 12/06/2015  . Liver cirrhosis ([place]) 12/06/2015  . Ascites 12/06/2015  . Hyponatremia 12/06/2015  . Encephalopathy, metabolic [id number]  . Acute respiratory failure with hypoxia and hypercapnia ([place]) 12/06/2015  . Acute on chronic systolic CHF (congestive heart failure) ([place]) 12/06/2015  . Dysphagia 12/06/2015  . Failure to thrive in adult 12/06/2015  . Erosion of intestine 12/06/2015  . 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 Dyspnea   . Hepatic cirrhosis ([place])   . Acute pyelonephritis 11/30/2015  . Sepsis secondary to UTI ([place]) 06/12/2015  . Cellulitis and abscess 06/12/2015  . Acute renal failure ([place]) 06/12/2015  . Hyperkalemia 06/12/2015  . Anemia 06/12/2015  . CAD (coronary artery disease) 06/12/2015  . DM (diabetes mellitus) ([place]) 06/12/2015  . HTN (hypertension) 06/12/2015  . Rheumatoid  arthritis ([place]) 06/12/2015  . GERD (gastroesophageal reflux disease) 06/12/2015  . DM2 (diabetes mellitus, type 2) ([place]) 06/12/2015    Orientation RESPIRATION BLADDER Height & Weight    Self, Place  O2 (Nasal Cannula (2 L/min) ) Continent 5\' 6"  (167.6 cm) 164 lbs.  BEHAVIORAL SYMPTOMS/MOOD NEUROLOGICAL BOWEL NUTRITION STATUS   (None)  (None) Continent Diet (DYS 3)  AMBULATORY STATUS COMMUNICATION OF NEEDS Skin   Extensive Assist Verbally Other (Comment) (Pressure Ulcer Location: Sacrum )                       Personal Care Assistance Level of Assistance  Bathing, Feeding, Dressing Bathing Assistance: Limited assistance Feeding assistance: Independent Dressing Assistance: Limited assistance     Functional Limitations Info  Sight, Hearing, Speech Sight Info: Adequate Hearing Info: Adequate Speech Info: Adequate    SPECIAL CARE FACTORS FREQUENCY        PT Frequency:  (5)              Contractures      Additional Factors Info  Insulin Sliding Scale, Code Status, Allergies Code Status Info:  (Full Code ) Allergies Info:  (Macrobid Nitrofurantoin Monohyd Macro, Penicillins, Tape, Azithromycin, Clarithromycin, Nitroglycerin, Leflunomide, Morphine, & Naproxen)   Insulin Sliding Scale Info:  (insulin aspart (novoLOG) injection 3 Units- 3 times a day with each mill )       Current Medications (12/06/2015):  This is the current hospital active medication list Current Facility-Administered Medications  Medication Dose Route Frequency Provider Last Rate Last Dose  . 0.9 %  sodium chloride infusion  250 mL Intravenous PRN Pavan  Pyreddy, MD      . acetaminophen (TYLENOL) tablet 650 mg  650 mg Oral Q6H PRN Pavan Pyreddy, MD      . albuterol (PROVENTIL) (2.5 MG/3ML) 0.083% nebulizer solution 2.5 mg  2.5 mg Nebulization Q6H PRN Flora Lipps, MD      . antiseptic oral rinse (CPC / CETYLPYRIDINIUM CHLORIDE 0.05%) solution 7 mL  7 mL Mouth Rinse q12n4p Theodoro Grist, MD   7 mL  at 12/05/15 1715  . aspirin EC tablet 81 mg  81 mg Oral Daily Saundra Shelling, MD   81 mg at 12/06/15 1152  . calcium carbonate (TUMS - dosed in mg elemental calcium) chewable tablet 200 mg of elemental calcium  1 tablet Oral Q4H PRN Lytle Butte, MD   200 mg of elemental calcium at 12/04/15 2255  . [START ON 12/16/2015] cyanocobalamin ((VITAMIN B-12)) injection 1,000 mcg  1,000 mcg Intramuscular Q30 days Saundra Shelling, MD      . diltiazem (CARDIZEM SR) 12 hr capsule 120 mg  120 mg Oral BID Saundra Shelling, MD   120 mg at 12/06/15 1154  . diphenhydrAMINE (BENADRYL) capsule 25 mg  25 mg Oral Q6H PRN Pavan Pyreddy, MD      . enoxaparin (LOVENOX) injection 40 mg  40 mg Subcutaneous Q24H Saundra Shelling, MD   40 mg at 12/05/15 2039  . escitalopram (LEXAPRO) tablet 10 mg  10 mg Oral Daily Saundra Shelling, MD   10 mg at 12/06/15 1151  . etodolac (LODINE) tablet 400 mg  400 mg Oral BID Saundra Shelling, MD   400 mg at 12/05/15 2040  . feeding supplement (PRO-STAT SUGAR FREE 64) liquid 30 mL  30 mL Oral BID Saundra Shelling, MD   30 mL at 12/06/15 1058  . ferrous sulfate tablet 325 mg  325 mg Oral TID Saundra Shelling, MD   325 mg at 12/06/15 1151  . folic acid (FOLVITE) tablet 1 mg  1 mg Oral Daily Pavan Pyreddy, MD   1 mg at 12/06/15 1151  . furosemide (LASIX) tablet 20 mg  20 mg Oral BID Theodoro Grist, MD   20 mg at 12/06/15 1151  . guaiFENesin (MUCINEX) 12 hr tablet 600 mg  600 mg Oral BID Saundra Shelling, MD   600 mg at 12/06/15 1151  . insulin aspart (novoLOG) injection 0-9 Units  0-9 Units Subcutaneous TID WC Theodoro Grist, MD   3 Units at 12/05/15 1710  . insulin aspart (novoLOG) injection 3 Units  3 Units Subcutaneous TID WC Theodoro Grist, MD   3 Units at 12/04/15 1721  . lisinopril (PRINIVIL,ZESTRIL) tablet 5 mg  5 mg Oral QHS Theodoro Grist, MD   5 mg at 12/05/15 2042  . loratadine (CLARITIN) tablet 10 mg  10 mg Oral Daily PRN Saundra Shelling, MD      . LORazepam (ATIVAN) tablet 0.5 mg  0.5 mg Oral Q8H PRN Saundra Shelling, MD   0.5 mg at 12/02/15 2205  . magic mouthwash  10 mL Oral QID PRN Saundra Shelling, MD      . meropenem (MERREM) 1 g in sodium chloride 0.9 % 100 mL IVPB  1 g Intravenous 3 times per day Henreitta Leber, MD   1 g at 12/06/15 0551  . metFORMIN (GLUCOPHAGE-XR) 24 hr tablet 500 mg  500 mg Oral Q breakfast Reatha Harps Pyreddy, MD   500 mg at 12/06/15 1146  . mometasone-formoterol (DULERA) 100-5 MCG/ACT inhaler 2 puff  2 puff Inhalation BID Saundra Shelling, MD  2 puff at 12/06/15 1145  . multivitamin with minerals tablet 1 tablet  1 tablet Oral Daily Saundra Shelling, MD   1 tablet at 12/06/15 1147  . omega-3 acid ethyl esters (LOVAZA) capsule 1 g  1 g Oral Daily Saundra Shelling, MD   1 g at 12/06/15 1151  . ondansetron (ZOFRAN) tablet 4 mg  4 mg Oral Q6H PRN Saundra Shelling, MD   4 mg at 12/01/15 0850  . oxybutynin (DITROPAN) tablet 5 mg  5 mg Oral BID Saundra Shelling, MD   5 mg at 12/06/15 1146  . pantoprazole (PROTONIX) EC tablet 40 mg  40 mg Oral Daily Saundra Shelling, MD   40 mg at 12/06/15 1146  . pentoxifylline (TRENTAL) CR tablet 400 mg  400 mg Oral TID WC Theodoro Grist, MD   400 mg at 12/06/15 1151  . predniSONE (DELTASONE) tablet 7.5 mg  7.5 mg Oral Q breakfast Pavan Pyreddy, MD   7.5 mg at 12/06/15 1146  . senna (SENOKOT) tablet 8.6 mg  1 tablet Oral Daily PRN Theodoro Grist, MD      . sodium chloride 0.9 % injection 3 mL  3 mL Intravenous Q12H Pavan Pyreddy, MD   3 mL at 12/06/15 1153  . sodium chloride 0.9 % injection 3 mL  3 mL Intravenous PRN Saundra Shelling, MD   3 mL at 12/05/15 1358  . tiotropium (SPIRIVA) inhalation capsule 18 mcg  18 mcg Inhalation Daily Flora Lipps, MD   18 mcg at 12/06/15 1145  . traMADol (ULTRAM) tablet 50 mg  50 mg Oral Q4H PRN Saundra Shelling, MD   50 mg at 12/06/15 1151  . vitamin C (ASCORBIC ACID) tablet 1,000 mg  1,000 mg Oral Daily Saundra Shelling, MD   1,000 mg at 12/06/15 1147     Discharge Medications: Please see discharge summary for a list of discharge  medications.  Relevant Imaging Results:  Relevant Lab Results:   Additional Information  (SSN [id number])  Loralyn Freshwater, LCSW

## 2015-12-06 NOTE — Progress Notes (Addendum)
Pt for discharge [facility] to [facility]. Alert. No resp distress.  Colostomy intact.  Soft  dk green stool. Foley patent. Sl d/cd.  [place]  [facility] in today to place picc in  Left upper arm. Dressing intact and cxr portable  Shows   Catheter intact.report called to [facility] and spoke to [place]

## 2015-12-06 NOTE — Progress Notes (Signed)
[place] vascular wellness notified  For picc placement.

## 2015-12-06 NOTE — Plan of Care (Signed)
Problem: Safety: Goal: Ability to remain free from injury will improve Outcome: Progressing Pt remains free from injury.  Fall precautions in place.  Problem: Pain Managment: Goal: General experience of comfort will improve Outcome: Progressing Pt takes Tramadol for generalized pain.  One given this shift.    Problem: Physical Regulation: Goal: Will remain free from infection Outcome: Progressing Pt receiving IV antibiotics.

## 2015-12-06 NOTE — Discharge Summary (Signed)
[facility] NAME: Suzanne Ewing    MR#:  [id number]  DATE OF BIRTH:  1946/11/06  DATE OF ADMISSION:  11/29/2015 ADMITTING PHYSICIAN: Saundra Shelling, MD  DATE OF DISCHARGE: No discharge date for patient encounter.  PRIMARY CARE PHYSICIAN: Juluis Pitch, MD     ADMISSION DIAGNOSIS:  Pelvic pain in female [R10.2] Acute urinary tract infection [N39.0] Pelvic pain affecting pregnancy [O99.89, R10.2]  DISCHARGE DIAGNOSIS:  Principal Problem:   Acute pyelonephritis Active Problems:   Proteus (mirabilis) (morganii) as the cause of diseases classified elsewhere   Infection due to ESBL-producing Escherichia coli   Dyspnea   Hepatic cirrhosis (HCC)   Pleural effusion, right   S/P thoracentesis   Acute respiratory failure with hypoxia and hypercapnia (HCC)   Acute on chronic systolic CHF (congestive heart failure) (HCC)   Dysphagia   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 Arthritis   . Anemia   . GERD (gastroesophageal reflux disease)   . Heart disease   . Hypertension   . Nephrolithiasis   . Heel ulcer ([place])   . Diabetes mellitus without complication ([place])   . Hyperlipidemia   . Coronary artery disease     .pro HOSPITAL COURSE:  the patient is 70 year old female who presents to the hospital with fever and chills, fall smelling urine and drainage around the catheter. Urinalysis revealed pyuria, concerning for UTI and patient was initiated on broad-spectrum antibiotic therapy. Patient also was noted to have right pleural effusion , for which she underwent 1.7 L thoracentesis 10th of January, nonhealing ulcer  in the left heel, CT of abdomen and pelvis revealed hepatic cirrhosis, left adrenal adenoma, atherosclerosis of the abdominal aorta without aneurysm, bilateral nephrolithiasis without hydronephrosis, multiple bladder stones. Admited of intermittent dysphagia symptoms. Complained of bilateral flank pains before coming to emergency room. Chest x-ray done one day after thoracentesis revealed reaccumulation of pleural fluid in the right chest.  Thoracic surgeon, was consulted. However, he did not recommend Pleurx catheter placement. Patient was transferred to CCU due to somnolence and retention of CO2 for BiPAP, however, eventually refused BiPAP. She was diuresed and her condition improved, patient was weaned off oxygen. Urinary cultures came back positive for ESBL Escherichia coli and Proteus mirabilis, she was continued on meropenem while in the hospital. However, she is going to be discharged on ertapenem to skilled nursing facility to continue antibiotic for 10 days to complete 14 day course. She will be seen by urologist and decision will be made in regards to bladder stone removal while she is on antibiotic therapy. While in the hospital patient was noted to have bloody looking discharge at the R side of colostomy stoma, but no no active bleeding, she was evaluated by surgery, ostomy nurse to see patient on Monday for ill fitting appliance. While in the hospital. Patient's appetite has been relatively poor. She was eating 40-50% of offered female with intermittent hypoglycemia episodes, so insulin Lantus was discontinued. It is recommended to advance it as needed depending on her oral intake. Patient was felt to be stable to be discharged back to skilled nursing facility  Discussion by problem 1. Acute pyelonephritis due to Proteus mirabilis and Escherichia coli ESBL due to chronic indwelling Foley catheter, blood cultures were negative, continue patient on ertapenem for 10 more days to complete 14 day course,  PICC line to be placed prior to discharge to skilled nursing facility, follow-up by urologist  to remove bladder stones 2. Peripheral vascular disease, evaluated by vascular surgery , patient had recent vascular evaluation, symptoms were felt to be due to systemic medical disease, stable at present  3. Left heel ulcer, seen by podiatry , felt to be chronic and did not require intervention 4 . Hyponatremia, improved when Patient was initiated on Lasix, follow closely sodium level as outpatient. .  5. Bladder stones, patient is to follow-up with Dr. Erlene Quan, urologist, for urologic intervention  by her or Dr. Bernardo Heater.  6. Right pleural effusion, due to liver cirrhosis and ascites , status post thoracentesis January 10th 2017, pleural fluid cytology is pending, cultures are negative . It i was  exudate, echocardiogram reveals ejection fraction of 40-45% , wall motion abnormalities in the apical as well as anterior walls , mild mitral and moderate tricuspid regurgitation , continue Lasix, appreciate Dr. Genevive Bi input, would benefit from cardiology evaluation as outpatient .  7. Dysphagia continue dysphagia 3 diet , appreciate speech Therapist input, oral intake is about 40%, insulin has been discontinued until oral intake improves. Patient is continued on sliding scale insulin and metformin for now. Blood glucose levels ranging between 70s to 200s 8. Failure to thrive adult , asked palliative input , but has not seen patient yet, may benefit from evaluation as outpatient.  9. Altered mental status, somnolence, due to CO2 retention ,resolved now . Ammonia level was normal at 20  10. Liver cirrhosis, acute hepatitis panel was negative, further workup by gastroenterologist as outpatient  Continue lasix , Trental , latter would help with peripheral vascular disease as well 11. Acute on chronic systolic CHF, continue patient on Lasix, ACE inhibitor , improved overall 12. Colostomy area erosions, ostomy  nurse to see patient on Monday, appreciate DR Burt Knack input, no active bleeding, following HGb level as outpatient    DISCHARGE CONDITIONS:   Stable  CONSULTS OBTAINED:  Treatment Team:  Samara Deist, DPM Katha Cabal, MD Hollice Espy, MD Florene Glen, MD  DRUG ALLERGIES:   Allergies  Allergen Reactions  . Macrobid [Nitrofurantoin Monohyd Macro] Itching, Rash and Swelling    PT HOSPITALIZED  . Penicillins Shortness Of Breath, Rash and Other (See Comments)    Has patient had a PCN reaction causing immediate rash, facial/tongue/throat swelling, SOB or lightheadedness with hypotension: Yes Has patient had a PCN reaction causing severe rash involving mucus membranes or skin necrosis: No Has patient had a PCN reaction that required hospitalization Yes Has patient had a PCN reaction occurring within the last 10 years: No If all of the above answers are "NO", then may proceed with Cephalosporin use.   . Tape Rash  . Azithromycin Other (See Comments)    Reaction: unknown   . Clarithromycin Other (See Comments)    Abdominal pain  . Nitroglycerin Other (See Comments)    Reaction - unknown  . Leflunomide Rash  . Morphine Nausea And Vomiting    Nausea and vomiting  . Naproxen Rash    DISCHARGE MEDICATIONS:   Current Discharge Medication List    START taking these medications   Details  albuterol (PROVENTIL) (2.5 MG/3ML) 0.083% nebulizer solution Take 3 mLs (2.5 mg total) by  nebulization every 6 (six) hours as needed for wheezing or shortness of breath. Qty: 75 mL, Refills: 12    antiseptic oral rinse (CPC / CETYLPYRIDINIUM CHLORIDE 0.05%) 0.05 % LIQD solution 7 mLs by Mouth Rinse route 2 times daily at 12 noon and 4 pm. Qty: 354 mL, Refills: 0    ertapenem 0.5 g in sodium chloride 0.9 % 50 mL Inject 0.5 g into the vein daily. Qty: 10 Dose, Refills: 0    lisinopril (PRINIVIL,ZESTRIL) 5 MG tablet Take 1 tablet (5 mg total) by mouth at bedtime. Qty: 30 tablet,  Refills: 6    magic mouthwash SOLN Take 10 mLs by mouth 4 (four) times daily as needed (candidiasis). Qty: 200 mL, Refills: 6    pentoxifylline (TRENTAL) 400 MG CR tablet Take 1 tablet (400 mg total) by mouth 3 (three) times daily with meals. Qty: 90 tablet, Refills: 6    tiotropium (SPIRIVA) 18 MCG inhalation capsule Place 1 capsule (18 mcg total) into inhaler and inhale daily. Qty: 30 capsule, Refills: 12      CONTINUE these medications which have CHANGED   Details  traMADol (ULTRAM) 50 MG tablet Take 1 tablet (50 mg total) by mouth every 4 (four) hours as needed for moderate pain. Qty: 30 tablet, Refills: 0      CONTINUE these medications which have NOT CHANGED   Details  acetaminophen (TYLENOL) 325 MG tablet Take 2 tablets (650 mg total) by mouth every 6 (six) hours as needed for mild pain (or Fever >/= 101). Qty: 60 tablet, Refills: 0    alum & mag hydroxide-simeth (GERI-LANTA) [id number] MG/5ML suspension Take 30 mLs by mouth every 4 (four) hours as needed for indigestion or heartburn.    Amino Acids-Protein Hydrolys (FEEDING SUPPLEMENT, PRO-STAT SUGAR FREE 64,) LIQD Take 30 mLs by mouth 2 (two) times daily.    ascorbic acid (VITAMIN C) 1000 MG tablet Take 1,000 mg by mouth daily.    cyanocobalamin (,VITAMIN B-12,) 1000 MCG/ML injection Inject 1,000 mcg into the muscle every 30 (thirty) days. Given on the 26th day of the month    dextromethorphan-guaiFENesin (MUCINEX DM) 30-600 MG 12hr tablet Take 1 tablet by mouth at bedtime.    diltiazem (CARDIZEM SR) 120 MG 12 hr capsule Take 120 mg by mouth 2 (two) times daily.    Diphenhyd-Hydrocort-Nystatin (FIRST-DUKES MOUTHWASH MT) Use as directed 5 mLs in the mouth or throat 4 (four) times daily as needed (candidiasis).    diphenhydrAMINE (SOMINEX) 25 MG tablet Take 25 mg by mouth every 6 (six) hours as needed for itching or sleep.     escitalopram (LEXAPRO) 10 MG tablet Take 10 mg by mouth daily.    etodolac (LODINE) 400 MG  tablet Take 400 mg by mouth 2 (two) times daily.    ferrous sulfate 325 (65 FE) MG tablet Take 325 mg by mouth 3 (three) times daily.     Fluticasone-Salmeterol (ADVAIR) 100-50 MCG/DOSE AEPB Inhale 1 puff into the lungs 2 (two) times daily.    folic acid (FOLVITE) 1 MG tablet Take 1 mg by mouth daily.     furosemide (LASIX) 20 MG tablet Take 20 mg by mouth 2 (two) times daily.    glucose blood test strip 1 each by Other route 4 (four) times daily -  before meals and at bedtime. Use as instructed    guaiFENesin (MUCINEX) 600 MG 12 hr tablet Take 300 mg by mouth daily.    ibandronate (BONIVA) 3 MG/3ML SOLN injection Inject 3 mg  into the vein every 3 (three) months. Taken on the 12th day of the month    loperamide (IMODIUM) 2 MG capsule Take 1 capsule (2 mg total) by mouth every 6 (six) hours as needed for diarrhea or loose stools. Qty: 30 capsule, Refills: 0    loratadine (CLARITIN) 10 MG tablet Take 10 mg by mouth daily as needed for allergies.    LORazepam (ATIVAN) 0.5 MG tablet Take 0.5 mg by mouth every 8 (eight) hours as needed for anxiety. Take 1/2 to 1(one) tablet by mouth once daily as needed for anxiety per medication list    metFORMIN (GLUCOPHAGE-XR) 500 MG 24 hr tablet Take 500 mg by mouth daily with breakfast.    metoprolol tartrate (LOPRESSOR) 25 MG tablet Take 25 mg by mouth 2 (two) times daily.    Multiple Vitamins-Minerals (CENTRUM SILVER PO) Take 1 tablet by mouth daily.     Omega-3 Fatty Acids (FISH OIL) 1000 MG CAPS Take 1 capsule by mouth daily.     omeprazole (PRILOSEC) 20 MG capsule Take 20 mg by mouth 2 (two) times daily.     ondansetron (ZOFRAN) 4 MG tablet Take 4 mg by mouth every 6 (six) hours as needed for nausea or vomiting.    oxybutynin (DITROPAN) 5 MG tablet Take 5 mg by mouth 2 (two) times daily.    potassium chloride (KLOR-CON M10) 10 MEQ tablet Take 1 tablet (10 mEq total) by mouth daily. Take with Lasix Qty: 30 tablet, Refills: 0    predniSONE  (DELTASONE) 2.5 MG tablet Take 7.5 mg by mouth daily with breakfast.    promethazine (PHENERGAN) 25 MG tablet Take 25 mg by mouth every 6 (six) hours as needed for nausea or vomiting.    vitamin A 10000 UNIT capsule Take 10,000 Units by mouth daily.     aspirin EC 81 MG EC tablet Take 1 tablet (81 mg total) by mouth daily. Qty: 30 tablet, Refills: 0    bismuth subsalicylate (PEPTO BISMOL) 262 MG/15ML suspension Take 30 mLs by mouth every 4 (four) hours as needed for diarrhea or loose stools. Qty: 360 mL, Refills: 0    collagenase (SANTYL) ointment Apply topically daily. Qty: 15 g, Refills: 0    insulin aspart (NOVOLOG) 100 UNIT/ML injection Inject 0-9 Units into the skin 3 (three) times daily with meals. Qty: 10 mL, Refills: 11    nystatin (MYCOSTATIN) 100000 UNIT/ML suspension Take 5 mLs (500,000 Units total) by mouth 4 (four) times daily. Qty: 60 mL, Refills: 0      STOP taking these medications     insulin detemir (LEVEMIR) 100 unit/ml SOLN      ondansetron (ZOFRAN) 4 MG/2ML SOLN injection          DISCHARGE INSTRUCTIONS:    Patient is to follow-up with primary care physician, urologist as outpatient  If you experience worsening of your admission symptoms, develop shortness of breath, life threatening emergency, suicidal or homicidal thoughts you must seek medical attention immediately by calling 911 or calling your MD immediately  if symptoms less severe.  You Must read complete instructions/literature along with all the possible adverse reactions/side effects for all the Medicines you take and that have been prescribed to you. Take any new Medicines after you have completely understood and accept all the possible adverse reactions/side effects.   Please note  You were cared for by a hospitalist during your hospital stay. If you have any questions about your discharge medications or the care you received while you  were in the hospital after you are discharged, you can  call the unit and asked to speak with the hospitalist on call if the hospitalist that took care of you is not available. Once you are discharged, your primary care physician will handle any further medical issues. Please note that NO REFILLS for any discharge medications will be authorized once you are discharged, as it is imperative that you return to your primary care physician (or establish a relationship with a primary care physician if you do not have one) for your aftercare needs so that they can reassess your need for medications and monitor your lab values.    Today   CHIEF COMPLAINT:   Chief Complaint  Patient presents with  . Fever    HISTORY OF PRESENT ILLNESS:  Suzanne Ewing  is a 70 y.o. female who presents to the hospital with fever and chills, fall smelling urine and drainage around the catheter. Urinalysis revealed pyuria, concerning for UTI and patient was initiated on broad-spectrum antibiotic therapy. Patient also was noted to have right pleural effusion , for which she underwent 1.7 L thoracentesis 10th of January, nonhealing ulcer in the left heel, CT of abdomen and pelvis revealed hepatic cirrhosis, left adrenal adenoma, atherosclerosis of the abdominal aorta without aneurysm, bilateral nephrolithiasis without hydronephrosis, multiple bladder stones. Admited of intermittent dysphagia symptoms. Complained of bilateral flank pains before coming to emergency room. Chest x-ray done one day after thoracentesis revealed reaccumulation of pleural fluid in the right chest.  Thoracic surgeon, was consulted. However, he did not recommend Pleurx catheter placement. Patient was transferred to CCU due to somnolence and retention of CO2 for BiPAP, however, eventually refused BiPAP. She was diuresed and her condition improved, patient was weaned off oxygen. Urinary cultures came back positive for ESBL Escherichia coli and Proteus mirabilis, she was continued on meropenem while in the hospital.  However, she is going to be discharged on ertapenem to skilled nursing facility to continue antibiotic for 10 days to complete 14 day course. She will be seen by urologist and decision will be made in regards to bladder stone removal while she is on antibiotic therapy. While in the hospital patient was noted to have bloody looking discharge at the R side of colostomy stoma, but no no active bleeding, she was evaluated by surgery, ostomy nurse to see patient on Monday for ill fitting appliance. While in the hospital. Patient's appetite has been relatively poor. She was eating 40-50% of offered female with intermittent hypoglycemia episodes, so insulin Lantus was discontinued. It is recommended to advance it as needed depending on her oral intake. Patient was felt to be stable to be discharged back to skilled nursing facility Discussion by problem 1. Acute pyelonephritis due to Proteus mirabilis and Escherichia coli ESBL due to chronic indwelling Foley catheter, blood cultures were negative, continue patient on ertapenem for 10 more days to complete 14 day course, PICC line to be placed prior to discharge to skilled nursing facility, follow-up by urologist  to remove bladder stones 2. Peripheral vascular disease, evaluated by vascular surgery , patient had recent vascular evaluation, symptoms were felt to be due to systemic medical disease, stable at present  3. Left heel ulcer, seen by podiatry , felt to be chronic and did not require intervention 4 . Hyponatremia, improved when Patient was initiated on Lasix, follow closely sodium level as outpatient. .  5. Bladder stones, patient is to follow-up with Dr. Erlene Quan, urologist, for urologic intervention  by her or  Dr. Bernardo Heater.  6. Right pleural effusion, due to liver cirrhosis and ascites , status post thoracentesis January 10th 2017, pleural fluid cytology is pending, cultures are negative . It i was  exudate, echocardiogram reveals ejection fraction of  40-45% , wall motion abnormalities in the apical as well as anterior walls , mild mitral and moderate tricuspid regurgitation , continue Lasix, appreciate Dr. Genevive Bi input, would benefit from cardiology evaluation as outpatient .  7. Dysphagia continue dysphagia 3 diet , appreciate speech Therapist input, oral intake is about 40%, insulin has been discontinued until oral intake improves. Patient is continued on sliding scale insulin and metformin for now. Blood glucose levels ranging between 70s to 200s 8. Failure to thrive adult , asked palliative input , but has not seen patient yet, may benefit from evaluation as outpatient.  9. Altered mental status, somnolence, due to CO2 retention ,resolved now . Ammonia level was normal at 20  10. Liver cirrhosis, acute hepatitis panel was negative, further workup by gastroenterologist as outpatient  Continue lasix , Trental , latter would help with peripheral vascular disease as well 11. Acute on chronic systolic CHF, continue patient on Lasix, ACE inhibitor , improved overall 12. Colostomy area erosions, ostomy nurse to see patient on Monday, appreciate DR Burt Knack input, no active bleeding, following HGb level as outpatient    VITAL SIGNS:  Blood pressure 145/53, pulse 78, temperature 98.4 F (36.9 C), temperature source Oral, resp. rate 16, height 5\' 6"  (1.676 m), weight 84.823 kg (187 lb), SpO2 94 %.  I/O:   Intake/Output Summary (Last 24 hours) at 12/06/15 1133 Last data filed at 12/06/15 0310  Gross per 24 hour  Intake      0 ml  Output   2800 ml  Net  -2800 ml    PHYSICAL EXAMINATION:  GENERAL:  70 y.o.-year-old patient lying in the bed with no acute distress.  EYES: Pupils equal, round, reactive to light and accommodation. No scleral icterus. Extraocular muscles intact.  HEENT: Head atraumatic, normocephalic. Oropharynx and nasopharynx clear.  NECK:  Supple, no jugular venous distention. No thyroid enlargement, no tenderness.  LUNGS:  Normal breath sounds bilaterally, no wheezing, rales,rhonchi or crepitation. No use of accessory muscles of respiration.  CARDIOVASCULAR: S1, S2 normal. No murmurs, rubs, or gallops.  ABDOMEN: Soft, non-tender, non-distended. Bowel sounds present. No organomegaly or mass.  EXTREMITIES: No pedal edema, cyanosis, or clubbing.  NEUROLOGIC: Cranial nerves II through XII are intact. Muscle strength 5/5 in all extremities. Sensation intact. Gait not checked.  PSYCHIATRIC: The patient is alert and oriented x 3.  SKIN: No obvious rash, lesion, or ulcer.   DATA REVIEW:   CBC  Recent Labs Lab 11/29/15 2119  12/04/15 0540  WBC 4.4  --   --   HGB 8.9*  < > 8.9*  HCT 28.8*  --   --   PLT 208  --   --   < > = values in this interval not displayed.  Chemistries   Recent Labs Lab 11/30/15 0716  12/03/15 0529 12/04/15 0540 12/06/15 0454  NA 134*  < > 134* 137  --   K 3.9  < > 4.3  --   --   CL 105  --  101  --   --   CO2 25  --  27  --   --   GLUCOSE 116*  --  86  --   --   BUN 26*  --  33*  --   --  CREATININE 0.67  --  0.85  --  0.79  CALCIUM 9.6  --  9.2  --   --   AST 12*  --   --   --   --   ALT 9*  --   --   --   --   ALKPHOS 61  --   --   --   --   BILITOT 0.4  --   --   --   --   < > = values in this interval not displayed.  Cardiac Enzymes No results for input(s): TROPONINI in the last 168 hours.  Microbiology Results  Results for orders placed or performed during the hospital encounter of 11/29/15  Urine culture     Status: None   Collection Time: 11/29/15  9:19 PM  Result Value Ref Range Status   Specimen Description URINE, CLEAN CATCH  Final   Special Requests NONE  Final   Culture   Final    >=100,000 COLONIES/mL PROTEUS MIRABILIS 50,000 COLONIES/mL ESCHERICHIA COLI CRITICAL RESULT CALLED TO, READ BACK BY AND VERIFIED WITH: Apolinar Junes @ [id number] 12/03/15 by [facility]    Report Status 12/03/2015 FINAL  Final   Organism ID, Bacteria ESCHERICHIA COLI  Final   Organism  ID, Bacteria PROTEUS MIRABILIS  Final      Susceptibility   Escherichia coli - MIC*    AMPICILLIN >=32 RESISTANT Resistant     CEFAZOLIN >=64 RESISTANT Resistant     CEFTRIAXONE 32 RESISTANT Resistant     CIPROFLOXACIN >=4 RESISTANT Resistant     GENTAMICIN 2 SENSITIVE Sensitive     IMIPENEM <=0.25 SENSITIVE Sensitive     NITROFURANTOIN <=16 SENSITIVE Sensitive     TRIMETH/SULFA >=320 RESISTANT Resistant     Extended ESBL POSITIVE Resistant     AMPICILLIN/SULBACTAM Value in next row Sensitive      SENSITIVE<=8    PIP/TAZO Value in next row Sensitive      SENSITIVE<=4    * 50,000 COLONIES/mL ESCHERICHIA COLI   Proteus mirabilis - MIC*    AMPICILLIN Value in next row Resistant      SENSITIVE<=4    CEFAZOLIN Value in next row Sensitive      SENSITIVE<=4    CEFTRIAXONE Value in next row Sensitive      SENSITIVE<=4    CIPROFLOXACIN Value in next row Resistant      SENSITIVE<=4    GENTAMICIN Value in next row Resistant      SENSITIVE<=4    IMIPENEM Value in next row Sensitive      SENSITIVE<=4    NITROFURANTOIN Value in next row Resistant      SENSITIVE<=4    TRIMETH/SULFA Value in next row Resistant      SENSITIVE<=4    CEFTAZIDIME Value in next row Sensitive      SENSITIVE<=1    PIP/TAZO Value in next row Sensitive      SENSITIVE<=4    * >=100,000 COLONIES/mL PROTEUS MIRABILIS  Blood Culture (routine x 2)     Status: None   Collection Time: 11/29/15  9:19 PM  Result Value Ref Range Status   Specimen Description BLOOD RIGHT ARM  Final   Special Requests BOTTLES DRAWN AEROBIC AND ANAEROBIC 5ML  Final   Culture NO GROWTH 5 DAYS  Final   Report Status 12/04/2015 FINAL  Final  Blood Culture (routine x 2)     Status: None   Collection Time: 11/29/15 10:01 PM  Result Value Ref Range Status  Specimen Description BLOOD RIGHT ARM  Final   Special Requests BOTTLES DRAWN AEROBIC AND ANAEROBIC 7ML  Final   Culture NO GROWTH 5 DAYS  Final   Report Status 12/04/2015 FINAL  Final   MRSA PCR Screening     Status: None   Collection Time: 11/30/15  5:36 AM  Result Value Ref Range Status   MRSA by PCR NEGATIVE NEGATIVE Final    Comment:        The GeneXpert MRSA Assay (FDA approved for NASAL specimens only), is one component of a comprehensive MRSA colonization surveillance program. It is not intended to diagnose MRSA infection nor to guide or monitor treatment for MRSA infections.   Body fluid culture     Status: None   Collection Time: 11/30/15  1:40 PM  Result Value Ref Range Status   Specimen Description PLEURAL  Final   Special Requests NONE  Final   Gram Stain RARE WBC SEEN NO ORGANISMS SEEN   Final   Culture No growth aerobically or anaerobically.  Final   Report Status 12/04/2015 FINAL  Final    RADIOLOGY:  No results found.  EKG:   Orders placed or performed during the hospital encounter of 11/29/15  . ED EKG 12-Lead  . ED EKG 12-Lead      Management plans discussed with the patient, family and they are in agreement.  CODE STATUS:     Code Status Orders        Start     Ordered   11/30/15 0310  Full code   Continuous     11/30/15 0309    Code Status History    Date Active Date Inactive Code Status Order ID Comments User Context   06/12/2015  3:47 AM 06/18/2015  5:34 PM Full Code [id number]  Juluis Mire, MD Inpatient      TOTAL TIME TAKING CARE OF THIS PATIENT: 40 minutes.    Theodoro Grist M.D on 12/06/2015 at 11:33 AM  Between 7am to 6pm - Pager - [phone]  After 6pm go to [email or web address] - password [email or web address]  Office  [phone]  CC: Primary care physician; Juluis Pitch, MD

## 2015-12-06 NOTE — Progress Notes (Signed)
Clinical Education officer, museum (CSW) spoke with patient's daughter Suzanne Ewing. Suzanne Ewing inquired about other bed offers. CSW contacted [facility] admissions coordinator. Per Tiffany they cannot accept patient because all Medicare days have been used and Medicaid application is pending. CSW made daughter aware of no other bed offers. Daughter is agreeable for patient to return to [facility].   Patient is medically stable for D/C to [facility] today. Per [facility] liaison patient is going to room 607. RN will call report and arrange EMS for transport. CSW sent D/C Summary, D/C Packet and FL2 via [facility]. CSW met with patient and her husband and made them aware of above. Daughter Suzanne Ewing is aware of above. Please reconsult if future social work needs arise. CSW signing off.   Suzanne Ewing, [place] [phone]

## 2015-12-06 NOTE — Care Management Important Message (Signed)
Important Message  Patient Details  Name: Keiarra Pendry MRN: [id number] Date of Birth: [birth year]   Medicare Important Message Given:  Yes    Shelbie Ammons, RN 12/06/2015, 9:40 AM

## 2015-12-07 LAB — GLUCOSE, CAPILLARY: GLUCOSE-CAPILLARY: 98 mg/dL (ref 65–99)

## 2015-12-13 LAB — PH, BODY FLUID: PH, BODY FLUID: 5.8

## 2015-12-15 ENCOUNTER — Other Ambulatory Visit: Payer: Self-pay | Admitting: Urology

## 2015-12-28 ENCOUNTER — Other Ambulatory Visit: Payer: Self-pay | Admitting: Vascular Surgery

## 2016-01-03 ENCOUNTER — Encounter
Admission: RE | Disposition: A | Discharge status: Home / Self Care | Payer: Self-pay | Source: Ambulatory Visit | Attending: Vascular Surgery

## 2016-01-03 ENCOUNTER — Ambulatory Visit
Admission: RE | Admit: 2016-01-03 | Discharge: 2016-01-03 | Disposition: A | Discharge status: Home / Self Care | Payer: Medicare Other | Source: Ambulatory Visit | Attending: Vascular Surgery | Admitting: Vascular Surgery

## 2016-01-03 ENCOUNTER — Encounter: Payer: Self-pay | Admitting: *Deleted

## 2016-01-03 DIAGNOSIS — L97421 Non-pressure chronic ulcer of left heel and midfoot limited to breakdown of skin: Secondary | ICD-10-CM | POA: Diagnosis not present

## 2016-01-03 DIAGNOSIS — Z7984 Long term (current) use of oral hypoglycemic drugs: Secondary | ICD-10-CM | POA: Diagnosis not present

## 2016-01-03 DIAGNOSIS — I10 Essential (primary) hypertension: Secondary | ICD-10-CM | POA: Diagnosis not present

## 2016-01-03 DIAGNOSIS — Z87891 Personal history of nicotine dependence: Secondary | ICD-10-CM | POA: Insufficient documentation

## 2016-01-03 DIAGNOSIS — Z79899 Other long term (current) drug therapy: Secondary | ICD-10-CM | POA: Insufficient documentation

## 2016-01-03 DIAGNOSIS — E119 Type 2 diabetes mellitus without complications: Secondary | ICD-10-CM | POA: Diagnosis not present

## 2016-01-03 DIAGNOSIS — L97321 Non-pressure chronic ulcer of left ankle limited to breakdown of skin: Secondary | ICD-10-CM | POA: Insufficient documentation

## 2016-01-03 DIAGNOSIS — I70243 Atherosclerosis of native arteries of left leg with ulceration of ankle: Secondary | ICD-10-CM | POA: Insufficient documentation

## 2016-01-03 DIAGNOSIS — Z794 Long term (current) use of insulin: Secondary | ICD-10-CM | POA: Diagnosis not present

## 2016-01-03 HISTORY — DX: Acute myocardial infarction, unspecified: I21.9

## 2016-01-03 HISTORY — PX: PERIPHERAL VASCULAR CATHETERIZATION: SHX172C

## 2016-01-03 LAB — BASIC METABOLIC PANEL
ANION GAP: 3 — AB (ref 5–15)
BUN: 30 mg/dL — ABNORMAL HIGH (ref 6–20)
CALCIUM: 10 mg/dL (ref 8.9–10.3)
CHLORIDE: 108 mmol/L (ref 101–111)
CO2: 27 mmol/L (ref 22–32)
Creatinine, Ser: 0.74 mg/dL (ref 0.44–1.00)
GFR calc non Af Amer: >60 mL/min (ref >60)
Glucose, Bld: 154 mg/dL — ABNORMAL HIGH (ref 65–99)
Potassium: 3.8 mmol/L (ref 3.5–5.1)
SODIUM: 138 mmol/L (ref 135–145)

## 2016-01-03 SURGERY — LOWER EXTREMITY ANGIOGRAPHY
Anesthesia: Moderate Sedation | Laterality: Left

## 2016-01-03 MED ORDER — CLINDAMYCIN PHOSPHATE 300 MG/50ML IV SOLN
INTRAVENOUS | Status: AC
  Administered 2016-01-03: 09:00:00
  Filled 2016-01-03: qty 50

## 2016-01-03 MED ORDER — HYDRALAZINE HCL 20 MG/ML IJ SOLN: 5.0000 mg | INTRAMUSCULAR | Status: DC | PRN

## 2016-01-03 MED ORDER — LIDOCAINE-EPINEPHRINE (PF) 1 %-1:200000 IJ SOLN
INTRAMUSCULAR | Status: AC
  Filled 2016-01-03: qty 30

## 2016-01-03 MED ORDER — OXYCODONE-ACETAMINOPHEN 5-325 MG PO TABS: 1.0000 | ORAL_TABLET | ORAL | Status: DC | PRN

## 2016-01-03 MED ORDER — HEPARIN (PORCINE) IN NACL 2-0.9 UNIT/ML-% IJ SOLN
INTRAMUSCULAR | Status: AC
  Filled 2016-01-03: qty 1000

## 2016-01-03 MED ORDER — CEFUROXIME SODIUM 1.5 G IJ SOLR
INTRAMUSCULAR | Status: AC
  Filled 2016-01-03 (×23): qty 1.5

## 2016-01-03 MED ORDER — IOHEXOL 300 MG/ML  SOLN
INTRAMUSCULAR | Status: DC | PRN
  Administered 2016-01-03: 55 mL via INTRA_ARTERIAL

## 2016-01-03 MED ORDER — ACETAMINOPHEN 325 MG PO TABS: 325.0000 mg | ORAL_TABLET | ORAL | Status: DC | PRN

## 2016-01-03 MED ORDER — HEPARIN SOD (PORK) LOCK FLUSH 10 UNIT/ML IV SOLN
INTRAVENOUS | Status: AC
  Filled 2016-01-03: qty 1

## 2016-01-03 MED ORDER — PHENOL 1.4 % MT LIQD: 1.0000 | OROMUCOSAL | Status: DC | PRN

## 2016-01-03 MED ORDER — GUAIFENESIN-DM 100-10 MG/5ML PO SYRP: 15.0000 mL | ORAL_SOLUTION | ORAL | Status: DC | PRN

## 2016-01-03 MED ORDER — ONDANSETRON HCL 4 MG/2ML IJ SOLN: 4.0000 mg | Freq: Four times a day (QID) | INTRAMUSCULAR | Status: DC | PRN

## 2016-01-03 MED ORDER — MIDAZOLAM HCL 5 MG/5ML IJ SOLN
INTRAMUSCULAR | Status: AC
  Filled 2016-01-03: qty 5

## 2016-01-03 MED ORDER — FAMOTIDINE 20 MG PO TABS: 40.0000 mg | ORAL_TABLET | ORAL | Status: DC | PRN

## 2016-01-03 MED ORDER — CLINDAMYCIN PHOSPHATE 300 MG/50ML IV SOLN: 300.0000 mg | Freq: Once | INTRAVENOUS | Status: DC

## 2016-01-03 MED ORDER — LABETALOL HCL 5 MG/ML IV SOLN: 10.0000 mg | INTRAVENOUS | Status: DC | PRN

## 2016-01-03 MED ORDER — HEPARIN SODIUM (PORCINE) 1000 UNIT/ML IJ SOLN
INTRAMUSCULAR | Status: AC
  Filled 2016-01-03: qty 1

## 2016-01-03 MED ORDER — METOPROLOL TARTRATE 1 MG/ML IV SOLN: 2.0000 mg | INTRAVENOUS | Status: DC | PRN

## 2016-01-03 MED ORDER — SODIUM CHLORIDE 0.9 % IV SOLN
INTRAVENOUS | Status: DC
  Administered 2016-01-03: 08:00:00 via INTRAVENOUS

## 2016-01-03 MED ORDER — LIDOCAINE-EPINEPHRINE (PF) 1 %-1:200000 IJ SOLN
INTRAMUSCULAR | Status: DC | PRN
  Administered 2016-01-03: 10 mL via INTRADERMAL

## 2016-01-03 MED ORDER — FENTANYL CITRATE (PF) 100 MCG/2ML IJ SOLN
INTRAMUSCULAR | Status: AC
  Filled 2016-01-03: qty 2

## 2016-01-03 MED ORDER — SODIUM CHLORIDE 0.9 % IV SOLN: 500.0000 mL | Freq: Once | INTRAVENOUS | Status: DC | PRN

## 2016-01-03 MED ORDER — ACETAMINOPHEN 325 MG RE SUPP: 325.0000 mg | RECTAL | Status: DC | PRN

## 2016-01-03 MED ORDER — MIDAZOLAM HCL 2 MG/2ML IJ SOLN
INTRAMUSCULAR | Status: DC | PRN
  Administered 2016-01-03: 2 mg via INTRAVENOUS

## 2016-01-03 MED ORDER — HYDROMORPHONE HCL 1 MG/ML IJ SOLN: 0.5000 mg | INTRAMUSCULAR | Status: DC | PRN

## 2016-01-03 MED ORDER — METHYLPREDNISOLONE SODIUM SUCC 125 MG IJ SOLR: 125.0000 mg | INTRAMUSCULAR | Status: DC | PRN

## 2016-01-03 MED ORDER — FENTANYL CITRATE (PF) 100 MCG/2ML IJ SOLN
INTRAMUSCULAR | Status: DC | PRN
  Administered 2016-01-03: 50 ug via INTRAVENOUS

## 2016-01-03 SURGICAL SUPPLY — 7 items
CATH PIG 70CM (CATHETERS) ×4 IMPLANT
DEVICE STARCLOSE SE CLOSURE (Vascular Products) ×4 IMPLANT
PACK ANGIOGRAPHY (CUSTOM PROCEDURE TRAY) ×4 IMPLANT
SHEATH BRITE TIP 5FRX11 (SHEATH) ×4 IMPLANT
SYR MEDRAD MARK V 150ML (SYRINGE) ×4 IMPLANT
TUBING CONTRAST HIGH PRESS 72 (TUBING) ×4 IMPLANT
WIRE J 3MM .035X145CM (WIRE) ×4 IMPLANT

## 2016-01-03 NOTE — Discharge Instructions (Signed)
Angiogram, Care After °Refer to this sheet in the next few weeks. These instructions provide you with information about caring for yourself after your procedure. Your health care provider may also give you more specific instructions. Your treatment has been planned according to current medical practices, but problems sometimes occur. Call your health care provider if you have any problems or questions after your procedure. °WHAT TO EXPECT AFTER THE PROCEDURE °After your procedure, it is typical to have the following: °· Bruising at the catheter insertion site that usually fades within 1-2 weeks. °· Blood collecting in the tissue (hematoma) that may be painful to the touch. It should usually decrease in size and tenderness within 1-2 weeks. °HOME CARE INSTRUCTIONS °· Take medicines only as directed by your health care provider. °· You may shower 24-48 hours after the procedure or as directed by your health care provider. Remove the bandage (dressing) and gently wash the site with plain soap and water. Pat the area dry with a clean towel. Do not rub the site, because this may cause bleeding. °· Do not take baths, swim, or use a hot tub until your health care provider approves. °· Check your insertion site every day for redness, swelling, or drainage. °· Do not apply powder or lotion to the site. °· Do not lift over 10 lb (4.5 kg) for 5 days after your procedure or as directed by your health care provider. °· Ask your health care provider when it is okay to: °¨ Return to work or school. °¨ Resume usual physical activities or sports. °¨ Resume sexual activity. °· Do not drive home if you are discharged the same day as the procedure. Have someone else drive you. °· You may drive 24 hours after the procedure unless otherwise instructed by your health care provider. °· Do not operate machinery or power tools for 24 hours after the procedure or as directed by your health care provider. °· If your procedure was done as an  outpatient procedure, which means that you went home the same day as your procedure, a responsible adult should be with you for the first 24 hours after you arrive home. °· Keep all follow-up visits as directed by your health care provider. This is important. °SEEK MEDICAL CARE IF: °· You have a fever. °· You have chills. °· You have increased bleeding from the catheter insertion site. Hold pressure on the site. °SEEK IMMEDIATE MEDICAL CARE IF: °· You have unusual pain at the catheter insertion site. °· You have redness, warmth, or swelling at the catheter insertion site. °· You have drainage (other than a small amount of blood on the dressing) from the catheter insertion site. °· The catheter insertion site is bleeding, and the bleeding does not stop after 30 minutes of holding steady pressure on the site. °· The area near or just beyond the catheter insertion site becomes pale, cool, tingly, or numb. °  °This information is not intended to replace advice given to you by your health care provider. Make sure you discuss any questions you have with your health care provider. °  °Document Released: 05/25/2005 Document Revised: 11/27/2014 Document Reviewed: 04/09/2013 °Elsevier Interactive Patient Education ©2016 [employer] ° °

## 2016-01-03 NOTE — Op Note (Signed)
[facility] Percutaneous Study/Intervention Procedural Note   Date of Surgery: 01/03/2016  Surgeon(s):Artesha Wemhoff   Assistants:none  Pre-operative Diagnosis: PAD with ulceration left lower extremity  Post-operative diagnosis: Same  Procedure(s) Performed: 1. Ultrasound guidance for vascular access  right femoral artery 2. Catheter placement into  left SFA from right femoral approach 3. Aortogram and selective  left lower extremity angiogram 4. StarClose closure device  right femoral artery  EBL:  Minimal  Contrast:  65 cc  Fluro Time:  2.1 minutes  Moderate Conscious Sedation Time: approximately  25 minutes using  2 mg of Versed and  25 mcg of Fentanyl  Indications: Patient is a 70 y.o.female with  a nonhealing ulceration of the left lower extremity. The patient has noninvasive study showing  significant medial calcification throughout the left lower extremity and suspected tibial disease. The patient is brought in for angiography for further evaluation and potential treatment. Risks and benefits are discussed and informed consent is obtained  Procedure: The patient was identified and appropriate procedural time out was performed. The patient was then placed supine on the table and prepped and draped in the usual sterile fashion.Moderate conscious sedation was administered during a face to face encounter with the patient throughout the procedure with my supervision of the RN administering medicines and monitoring the patient's vital signs, pulse oximetry, telemetry and mental status throughout from the start of the procedure until the patient was taken to the recovery room. Ultrasound was used to evaluate the  right common femoral artery. It was patent . A digital ultrasound image was acquired. A Seldinger needle was used to access the  right common femoral artery under direct ultrasound  guidance and a permanent image was performed. A 0.035 J wire was advanced without resistance and a 5Fr sheath was placed. Pigtail catheter was placed into the aorta and an AP aortogram was performed. This demonstrated normal renal arteries and normal aorta and iliac segments without significant stenosis. I then crossed the aortic bifurcation and advanced to the  left femoral head.  Due to slow flow the catheter was advanced to the mid superficial femoral artery to help opacify distally.Selective  left lower extremity angiogram was then performed. This demonstrated approximately 30% mid SFA stenosis which was not flow limiting. Posterior tibial artery continuous to the foot. Peroneal artery small but continuous to the ankle. Anterior tibial artery without focal stenosis but terminating at the level of the distal leg just above the ankle with no distal reconstitution seen. Severe small vessel distally. At this point, the patient had adequate flow from a large vessel standpoint to heal her ulceration and there was no role for endovascular revascularization without focal stenosis or occlusion identified. I elected to terminate the procedure. The sheath was removed and StarClose closure device was deployed in the left femoral artery with excellent hemostatic result. The patient was taken to the recovery room in stable condition having tolerated the procedure well.  Findings:  Aortogram: normal aorta, normal renal arteries, normal iliac arteries Left Lower Extremity: Approximately 30% mid SFA stenosis which was not flow limiting. Posterior tibial artery continuous to the foot. Peroneal artery small but continuous to the ankle. Anterior tibial artery without focal stenosis but terminating at the level of the distal leg just above the ankle with no distal reconstitution seen. Severe small vessel distally   Disposition: Patient was taken to the recovery room in stable condition having  tolerated the procedure well.  Complications: None  Elianah Karis 01/03/2016 9:08  AM

## 2016-01-03 NOTE — H&P (Signed)
  [facility] History & Physical Update  The patient was interviewed and re-examined.  The patient's previous History and Physical has been reviewed and is unchanged.  There is no change in the plan of care. We plan to proceed with the scheduled procedure.  DEW,JASON, MD  01/03/2016, 8:02 AM

## 2016-01-04 ENCOUNTER — Encounter: Payer: Self-pay | Admitting: Vascular Surgery

## 2016-01-18 ENCOUNTER — Telehealth: Payer: Self-pay

## 2016-01-18 NOTE — Telephone Encounter (Signed)
-----   Message from Lucilla Lame, MD sent at 01/11/2016  1:15 PM EST ----- Have this patient follow up in the office

## 2016-02-16 NOTE — Telephone Encounter (Signed)
I have tried contacting pt to schedule a follow up appt with Dr. Allen Norris. No return calls. I will mail a letter to pt to call and schedule.

## 2016-05-25 ENCOUNTER — Other Ambulatory Visit: Payer: Self-pay | Admitting: Cardiology

## 2016-05-25 DIAGNOSIS — R140 Abdominal distension (gaseous): Secondary | ICD-10-CM

## 2016-05-25 DIAGNOSIS — R600 Localized edema: Secondary | ICD-10-CM

## 2016-05-26 ENCOUNTER — Ambulatory Visit: Payer: Medicare Other

## 2016-05-26 ENCOUNTER — Ambulatory Visit
Admission: RE | Admit: 2016-05-26 | Discharge: 2016-05-26 | Disposition: A | Discharge status: Home / Self Care | Payer: Medicare Other | Source: Ambulatory Visit | Attending: Cardiology | Admitting: Cardiology

## 2016-05-26 ENCOUNTER — Ambulatory Visit: Admission: RE | Admit: 2016-05-26 | Payer: Medicare Other | Source: Ambulatory Visit

## 2016-05-26 DIAGNOSIS — R140 Abdominal distension (gaseous): Secondary | ICD-10-CM | POA: Insufficient documentation

## 2016-05-26 DIAGNOSIS — R600 Localized edema: Secondary | ICD-10-CM

## 2016-05-26 DIAGNOSIS — R188 Other ascites: Secondary | ICD-10-CM | POA: Diagnosis not present

## 2016-05-26 DIAGNOSIS — R609 Edema, unspecified: Secondary | ICD-10-CM | POA: Diagnosis not present

## 2016-06-01 ENCOUNTER — Other Ambulatory Visit
Admission: RE | Admit: 2016-06-01 | Discharge: 2016-06-01 | Disposition: A | Discharge status: Home / Self Care | Payer: Medicare Other | Source: Ambulatory Visit | Attending: Family Medicine | Admitting: Family Medicine

## 2016-06-01 DIAGNOSIS — R197 Diarrhea, unspecified: Secondary | ICD-10-CM | POA: Diagnosis present

## 2016-06-01 LAB — CLOSTRIDIUM DIFFICILE BY PCR: Toxigenic C. Difficile by PCR: POSITIVE — AB

## 2016-06-01 LAB — C DIFFICILE QUICK SCREEN W PCR REFLEX
C DIFFICILE (CDIFF) TOXIN: NEGATIVE
C Diff antigen: POSITIVE — AB

## 2016-07-08 ENCOUNTER — Inpatient Hospital Stay: Payer: Medicare Other

## 2016-07-08 ENCOUNTER — Encounter: Payer: Self-pay | Admitting: Emergency Medicine

## 2016-07-08 ENCOUNTER — Inpatient Hospital Stay
Admission: EM | Admit: 2016-07-08 | Discharge: 2016-07-21 | DRG: 871 | Disposition: E | Payer: Medicare Other | Attending: Internal Medicine | Admitting: Internal Medicine

## 2016-07-08 ENCOUNTER — Emergency Department: Payer: Medicare Other

## 2016-07-08 ENCOUNTER — Inpatient Hospital Stay: Payer: Medicare Other | Admitting: Certified Registered Nurse Anesthetist

## 2016-07-08 ENCOUNTER — Encounter: Admission: EM | Disposition: E | Payer: Self-pay | Source: Home / Self Care | Attending: Internal Medicine

## 2016-07-08 DIAGNOSIS — E872 Acidosis: Secondary | ICD-10-CM | POA: Diagnosis not present

## 2016-07-08 DIAGNOSIS — N170 Acute kidney failure with tubular necrosis: Secondary | ICD-10-CM | POA: Diagnosis present

## 2016-07-08 DIAGNOSIS — I252 Old myocardial infarction: Secondary | ICD-10-CM | POA: Diagnosis not present

## 2016-07-08 DIAGNOSIS — J449 Chronic obstructive pulmonary disease, unspecified: Secondary | ICD-10-CM | POA: Diagnosis present

## 2016-07-08 DIAGNOSIS — I959 Hypotension, unspecified: Secondary | ICD-10-CM | POA: Diagnosis present

## 2016-07-08 DIAGNOSIS — N132 Hydronephrosis with renal and ureteral calculous obstruction: Secondary | ICD-10-CM | POA: Diagnosis present

## 2016-07-08 DIAGNOSIS — A419 Sepsis, unspecified organism: Secondary | ICD-10-CM | POA: Diagnosis present

## 2016-07-08 DIAGNOSIS — Z79899 Other long term (current) drug therapy: Secondary | ICD-10-CM

## 2016-07-08 DIAGNOSIS — Z515 Encounter for palliative care: Secondary | ICD-10-CM | POA: Diagnosis not present

## 2016-07-08 DIAGNOSIS — Z4659 Encounter for fitting and adjustment of other gastrointestinal appliance and device: Secondary | ICD-10-CM

## 2016-07-08 DIAGNOSIS — N200 Calculus of kidney: Secondary | ICD-10-CM | POA: Diagnosis not present

## 2016-07-08 DIAGNOSIS — E1151 Type 2 diabetes mellitus with diabetic peripheral angiopathy without gangrene: Secondary | ICD-10-CM | POA: Diagnosis present

## 2016-07-08 DIAGNOSIS — Z66 Do not resuscitate: Secondary | ICD-10-CM | POA: Diagnosis not present

## 2016-07-08 DIAGNOSIS — E1122 Type 2 diabetes mellitus with diabetic chronic kidney disease: Secondary | ICD-10-CM | POA: Diagnosis present

## 2016-07-08 DIAGNOSIS — N179 Acute kidney failure, unspecified: Secondary | ICD-10-CM

## 2016-07-08 DIAGNOSIS — A4151 Sepsis due to Escherichia coli [E. coli]: Secondary | ICD-10-CM | POA: Diagnosis present

## 2016-07-08 DIAGNOSIS — J9601 Acute respiratory failure with hypoxia: Secondary | ICD-10-CM | POA: Diagnosis present

## 2016-07-08 DIAGNOSIS — Z87440 Personal history of urinary (tract) infections: Secondary | ICD-10-CM

## 2016-07-08 DIAGNOSIS — N189 Chronic kidney disease, unspecified: Secondary | ICD-10-CM | POA: Diagnosis present

## 2016-07-08 DIAGNOSIS — I2510 Atherosclerotic heart disease of native coronary artery without angina pectoris: Secondary | ICD-10-CM | POA: Diagnosis present

## 2016-07-08 DIAGNOSIS — L8990 Pressure ulcer of unspecified site, unspecified stage: Secondary | ICD-10-CM | POA: Insufficient documentation

## 2016-07-08 DIAGNOSIS — Z8249 Family history of ischemic heart disease and other diseases of the circulatory system: Secondary | ICD-10-CM

## 2016-07-08 DIAGNOSIS — I4891 Unspecified atrial fibrillation: Secondary | ICD-10-CM | POA: Diagnosis present

## 2016-07-08 DIAGNOSIS — Z87891 Personal history of nicotine dependence: Secondary | ICD-10-CM

## 2016-07-08 DIAGNOSIS — M810 Age-related osteoporosis without current pathological fracture: Secondary | ICD-10-CM | POA: Diagnosis present

## 2016-07-08 DIAGNOSIS — N390 Urinary tract infection, site not specified: Secondary | ICD-10-CM | POA: Diagnosis present

## 2016-07-08 DIAGNOSIS — R6521 Severe sepsis with septic shock: Secondary | ICD-10-CM | POA: Diagnosis present

## 2016-07-08 DIAGNOSIS — K219 Gastro-esophageal reflux disease without esophagitis: Secondary | ICD-10-CM | POA: Diagnosis present

## 2016-07-08 DIAGNOSIS — I130 Hypertensive heart and chronic kidney disease with heart failure and stage 1 through stage 4 chronic kidney disease, or unspecified chronic kidney disease: Secondary | ICD-10-CM | POA: Diagnosis present

## 2016-07-08 DIAGNOSIS — E874 Mixed disorder of acid-base balance: Secondary | ICD-10-CM | POA: Diagnosis present

## 2016-07-08 DIAGNOSIS — E785 Hyperlipidemia, unspecified: Secondary | ICD-10-CM | POA: Diagnosis present

## 2016-07-08 DIAGNOSIS — L97429 Non-pressure chronic ulcer of left heel and midfoot with unspecified severity: Secondary | ICD-10-CM | POA: Diagnosis present

## 2016-07-08 DIAGNOSIS — Z933 Colostomy status: Secondary | ICD-10-CM

## 2016-07-08 DIAGNOSIS — I5022 Chronic systolic (congestive) heart failure: Secondary | ICD-10-CM | POA: Diagnosis present

## 2016-07-08 DIAGNOSIS — Z7982 Long term (current) use of aspirin: Secondary | ICD-10-CM

## 2016-07-08 DIAGNOSIS — N136 Pyonephrosis: Secondary | ICD-10-CM

## 2016-07-08 DIAGNOSIS — L97419 Non-pressure chronic ulcer of right heel and midfoot with unspecified severity: Secondary | ICD-10-CM | POA: Diagnosis present

## 2016-07-08 DIAGNOSIS — Z833 Family history of diabetes mellitus: Secondary | ICD-10-CM

## 2016-07-08 DIAGNOSIS — J9600 Acute respiratory failure, unspecified whether with hypoxia or hypercapnia: Secondary | ICD-10-CM

## 2016-07-08 DIAGNOSIS — Z823 Family history of stroke: Secondary | ICD-10-CM

## 2016-07-08 DIAGNOSIS — F329 Major depressive disorder, single episode, unspecified: Secondary | ICD-10-CM | POA: Diagnosis present

## 2016-07-08 DIAGNOSIS — Z951 Presence of aortocoronary bypass graft: Secondary | ICD-10-CM

## 2016-07-08 DIAGNOSIS — R339 Retention of urine, unspecified: Secondary | ICD-10-CM

## 2016-07-08 DIAGNOSIS — R0600 Dyspnea, unspecified: Secondary | ICD-10-CM

## 2016-07-08 DIAGNOSIS — J9690 Respiratory failure, unspecified, unspecified whether with hypoxia or hypercapnia: Secondary | ICD-10-CM

## 2016-07-08 DIAGNOSIS — G9341 Metabolic encephalopathy: Secondary | ICD-10-CM | POA: Diagnosis present

## 2016-07-08 DIAGNOSIS — Z7983 Long term (current) use of bisphosphonates: Secondary | ICD-10-CM

## 2016-07-08 DIAGNOSIS — J90 Pleural effusion, not elsewhere classified: Secondary | ICD-10-CM | POA: Diagnosis present

## 2016-07-08 DIAGNOSIS — J9811 Atelectasis: Secondary | ICD-10-CM

## 2016-07-08 DIAGNOSIS — R34 Anuria and oliguria: Secondary | ICD-10-CM | POA: Diagnosis present

## 2016-07-08 HISTORY — DX: Major depressive disorder, single episode, unspecified: F32.9

## 2016-07-08 HISTORY — DX: Atherosclerotic heart disease of native coronary artery without angina pectoris: I25.10

## 2016-07-08 HISTORY — DX: Long term (current) use of antibiotics: Z79.2

## 2016-07-08 HISTORY — DX: Non-pressure chronic ulcer of unspecified part of right lower leg with unspecified severity: L97.919

## 2016-07-08 HISTORY — PX: CYSTOSCOPY WITH RETROGRADE PYELOGRAM, URETEROSCOPY AND STENT PLACEMENT: SHX5789

## 2016-07-08 HISTORY — DX: Heart failure, unspecified: I50.9

## 2016-07-08 HISTORY — DX: Anxiety disorder, unspecified: F41.9

## 2016-07-08 HISTORY — DX: Unspecified protein-calorie malnutrition: E46

## 2016-07-08 HISTORY — DX: Pressure ulcer of sacral region, unspecified stage: L89.159

## 2016-07-08 HISTORY — DX: Necrotizing fasciitis: M72.6

## 2016-07-08 HISTORY — DX: Edema, unspecified: R60.9

## 2016-07-08 HISTORY — DX: Other chronic pain: G89.29

## 2016-07-08 HISTORY — DX: Shortness of breath: R06.02

## 2016-07-08 HISTORY — DX: Depression, unspecified: F32.A

## 2016-07-08 HISTORY — DX: Urinary tract infection, site not specified: N39.0

## 2016-07-08 HISTORY — DX: Candidiasis, unspecified: B37.9

## 2016-07-08 HISTORY — DX: Chronic obstructive pulmonary disease, unspecified: J44.9

## 2016-07-08 HISTORY — DX: Other seasonal allergic rhinitis: J30.2

## 2016-07-08 HISTORY — DX: Non-pressure chronic ulcer of unspecified part of left lower leg with unspecified severity: L97.929

## 2016-07-08 HISTORY — DX: Acute upper respiratory infection, unspecified: J06.9

## 2016-07-08 HISTORY — DX: Chronic kidney disease, unspecified: N18.9

## 2016-07-08 HISTORY — DX: Hyperkalemia: E87.5

## 2016-07-08 HISTORY — DX: Age-related osteoporosis without current pathological fracture: M81.0

## 2016-07-08 HISTORY — DX: Retention of urine, unspecified: R33.9

## 2016-07-08 HISTORY — DX: Methicillin resistant Staphylococcus aureus infection, unspecified site: A49.02

## 2016-07-08 LAB — COMPREHENSIVE METABOLIC PANEL
ALT: 46 U/L (ref 14–54)
ALT: 77 U/L — ABNORMAL HIGH (ref 14–54)
ANION GAP: 10 (ref 5–15)
ANION GAP: 5 (ref 5–15)
AST: 76 U/L — ABNORMAL HIGH (ref 15–41)
AST: 123 U/L — AB (ref 15–41)
Albumin: 2.3 g/dL — ABNORMAL LOW (ref 3.5–5.0)
Albumin: 2 g/dL — ABNORMAL LOW (ref 3.5–5.0)
Alkaline Phosphatase: 92 U/L (ref 38–126)
Alkaline Phosphatase: 77 U/L (ref 38–126)
BUN: 45 mg/dL — ABNORMAL HIGH (ref 6–20)
BUN: 43 mg/dL — AB (ref 6–20)
CALCIUM: 9.5 mg/dL (ref 8.9–10.3)
CHLORIDE: 113 mmol/L — AB (ref 101–111)
CHLORIDE: 114 mmol/L — AB (ref 101–111)
CO2: 18 mmol/L — AB (ref 22–32)
CO2: 19 mmol/L — ABNORMAL LOW (ref 22–32)
CREATININE: 1.5 mg/dL — AB (ref 0.44–1.00)
Calcium: 8 mg/dL — ABNORMAL LOW (ref 8.9–10.3)
Creatinine, Ser: 1.53 mg/dL — ABNORMAL HIGH (ref 0.44–1.00)
GFR calc Af Amer: 39 mL/min — ABNORMAL LOW (ref >60)
GFR, EST AFRICAN AMERICAN: 40 mL/min — AB (ref >60)
GFR, EST NON AFRICAN AMERICAN: 34 mL/min — AB (ref >60)
GFR, EST NON AFRICAN AMERICAN: 33 mL/min — AB (ref >60)
Glucose, Bld: 65 mg/dL (ref 65–99)
Glucose, Bld: 111 mg/dL — ABNORMAL HIGH (ref 65–99)
POTASSIUM: 4.1 mmol/L (ref 3.5–5.1)
Potassium: 4.1 mmol/L (ref 3.5–5.1)
SODIUM: 141 mmol/L (ref 135–145)
Sodium: 138 mmol/L (ref 135–145)
Total Bilirubin: 0.5 mg/dL (ref 0.3–1.2)
Total Bilirubin: 0.5 mg/dL (ref 0.3–1.2)
Total Protein: 5.6 g/dL — ABNORMAL LOW (ref 6.5–8.1)
Total Protein: 5.1 g/dL — ABNORMAL LOW (ref 6.5–8.1)

## 2016-07-08 LAB — BLOOD GAS, ARTERIAL
ACID-BASE DEFICIT: 13.8 mmol/L — AB (ref 0.0–2.0)
ACID-BASE DEFICIT: 9 mmol/L — AB (ref 0.0–2.0)
Allens test (pass/fail): POSITIVE — AB
BICARBONATE: 15.2 meq/L — AB (ref 21.0–28.0)
BICARBONATE: 18 meq/L — AB (ref 21.0–28.0)
FIO2: 0.3
FIO2: 0.5
LHR: 15 {breaths}/min
LHR: 30 {breaths}/min
MECHANICAL RATE: 30
O2 SAT: 94.6 %
O2 Saturation: 80.6 %
PATIENT TEMPERATURE: 37
PCO2 ART: 42 mmHg (ref 32.0–48.0)
PEEP/CPAP: 5 cmH2O
PEEP/CPAP: 5 cmH2O
PH ART: 7.1 — AB (ref 7.350–7.450)
PO2 ART: 86 mmHg (ref 83.0–108.0)
Patient temperature: 37
VT: 400 mL
VT: 400 mL
pCO2 arterial: 49 mmHg — ABNORMAL HIGH (ref 32.0–48.0)
pH, Arterial: 7.24 — ABNORMAL LOW (ref 7.350–7.450)
pO2, Arterial: 62 mmHg — ABNORMAL LOW (ref 83.0–108.0)

## 2016-07-08 LAB — CBC WITH DIFFERENTIAL/PLATELET
BASOS PCT: 0 %
Basophils Absolute: 0 10*3/uL (ref 0–0.1)
Basophils Absolute: 0 10*3/uL (ref 0–0.1)
Basophils Relative: 0 %
EOS ABS: 0 10*3/uL (ref 0–0.7)
EOS PCT: 0 %
Eosinophils Absolute: 0 10*3/uL (ref 0–0.7)
Eosinophils Relative: 1 %
HEMATOCRIT: 29 % — AB (ref 35.0–47.0)
HEMATOCRIT: 26.6 % — AB (ref 35.0–47.0)
HEMOGLOBIN: 9.1 g/dL — AB (ref 12.0–16.0)
HEMOGLOBIN: 8 g/dL — AB (ref 12.0–16.0)
LYMPHS ABS: 0.1 10*3/uL — AB (ref 1.0–3.6)
LYMPHS ABS: 0.5 10*3/uL — AB (ref 1.0–3.6)
LYMPHS PCT: 2 %
Lymphocytes Relative: 3 %
MCH: 24.9 pg — ABNORMAL LOW (ref 26.0–34.0)
MCH: 24.2 pg — AB (ref 26.0–34.0)
MCHC: 31.4 g/dL — ABNORMAL LOW (ref 32.0–36.0)
MCHC: 30.1 g/dL — ABNORMAL LOW (ref 32.0–36.0)
MCV: 79.3 fL — ABNORMAL LOW (ref 80.0–100.0)
MCV: 80.2 fL (ref 80.0–100.0)
MONOS PCT: 1 %
Monocytes Absolute: 0.1 10*3/uL — ABNORMAL LOW (ref 0.2–0.9)
Monocytes Absolute: 0.8 10*3/uL (ref 0.2–0.9)
Monocytes Relative: 4 %
NEUTROS ABS: 5.7 10*3/uL (ref 1.4–6.5)
NEUTROS ABS: 18.5 10*3/uL — AB (ref 1.4–6.5)
NEUTROS PCT: 95 %
NEUTROS PCT: 94 %
Platelets: 143 10*3/uL — ABNORMAL LOW (ref 150–440)
Platelets: 138 10*3/uL — ABNORMAL LOW (ref 150–440)
RBC: 3.66 MIL/uL — AB (ref 3.80–5.20)
RBC: 3.32 MIL/uL — AB (ref 3.80–5.20)
RDW: 17.9 % — ABNORMAL HIGH (ref 11.5–14.5)
RDW: 18 % — ABNORMAL HIGH (ref 11.5–14.5)
WBC: 5.9 10*3/uL (ref 3.6–11.0)
WBC: 19.9 10*3/uL — AB (ref 3.6–11.0)

## 2016-07-08 LAB — URINALYSIS COMPLETE WITH MICROSCOPIC (ARMC ONLY)
Bilirubin Urine: NEGATIVE
Glucose, UA: NEGATIVE mg/dL
HGB URINE DIPSTICK: NEGATIVE
Ketones, ur: NEGATIVE mg/dL
NITRITE: NEGATIVE
PH: 5 (ref 5.0–8.0)
Protein, ur: 100 mg/dL — AB
SPECIFIC GRAVITY, URINE: 1.013 (ref 1.005–1.030)

## 2016-07-08 LAB — GLUCOSE, CAPILLARY
GLUCOSE-CAPILLARY: 72 mg/dL (ref 65–99)
GLUCOSE-CAPILLARY: 40 mg/dL — AB (ref 65–99)
GLUCOSE-CAPILLARY: 66 mg/dL (ref 65–99)
Glucose-Capillary: 74 mg/dL (ref 65–99)
Glucose-Capillary: 80 mg/dL (ref 65–99)
Glucose-Capillary: 93 mg/dL (ref 65–99)

## 2016-07-08 LAB — MRSA PCR SCREENING: MRSA by PCR: NEGATIVE

## 2016-07-08 LAB — TROPONIN I: Troponin I: 0.06 ng/mL (ref <0.03)

## 2016-07-08 LAB — LACTIC ACID, PLASMA
LACTIC ACID, VENOUS: 5.1 mmol/L — AB (ref 0.5–1.9)
LACTIC ACID, VENOUS: 4.4 mmol/L — AB (ref 0.5–1.9)

## 2016-07-08 LAB — PHOSPHORUS: Phosphorus: 3.4 mg/dL (ref 2.5–4.6)

## 2016-07-08 LAB — PROCALCITONIN: PROCALCITONIN: 50.01 ng/mL

## 2016-07-08 LAB — MAGNESIUM: Magnesium: 1.3 mg/dL — ABNORMAL LOW (ref 1.7–2.4)

## 2016-07-08 SURGERY — CYSTOURETEROSCOPY, WITH RETROGRADE PYELOGRAM AND STENT INSERTION
Anesthesia: General | Laterality: Left | Wound class: Clean Contaminated

## 2016-07-08 MED ORDER — VANCOMYCIN HCL IN DEXTROSE 1-5 GM/200ML-% IV SOLN
1000.0000 mg | Freq: Once | INTRAVENOUS | Status: AC
  Administered 2016-07-08: 1000 mg via INTRAVENOUS
  Filled 2016-07-08: qty 200

## 2016-07-08 MED ORDER — SODIUM CHLORIDE 0.9 % IV SOLN
INTRAVENOUS | Status: DC
Start: 2016-07-08 — End: 2016-07-08
  Administered 2016-07-08: 14:00:00 via INTRAVENOUS

## 2016-07-08 MED ORDER — LIDOCAINE HCL (CARDIAC) 20 MG/ML IV SOLN
INTRAVENOUS | Status: DC | PRN
  Administered 2016-07-08: 50 mg via INTRAVENOUS

## 2016-07-08 MED ORDER — ENOXAPARIN SODIUM 40 MG/0.4ML ~~LOC~~ SOLN: 40.0000 mg | SUBCUTANEOUS | Status: DC

## 2016-07-08 MED ORDER — INSULIN ASPART 100 UNIT/ML ~~LOC~~ SOLN
2.0000 [IU] | SUBCUTANEOUS | Status: DC
  Administered 2016-07-09: 2 [IU] via SUBCUTANEOUS
  Filled 2016-07-08: qty 2

## 2016-07-08 MED ORDER — SODIUM BICARBONATE 8.4 % IV SOLN
INTRAVENOUS | Status: DC
  Administered 2016-07-08 – 2016-07-10 (×4): via INTRAVENOUS
  Filled 2016-07-08 (×12): qty 850

## 2016-07-08 MED ORDER — MIDAZOLAM HCL 2 MG/2ML IJ SOLN
INTRAMUSCULAR | Status: AC
  Administered 2016-07-08: 1 mg via INTRAVENOUS
  Filled 2016-07-08: qty 2

## 2016-07-08 MED ORDER — FENTANYL 2500MCG IN NS 250ML (10MCG/ML) PREMIX INFUSION
10.0000 ug/h | INTRAVENOUS | Status: DC
  Administered 2016-07-08: 25 ug/h via INTRAVENOUS
  Administered 2016-07-09: 300 ug/h via INTRAVENOUS
  Administered 2016-07-09: 150 ug/h via INTRAVENOUS
  Administered 2016-07-10: 10 ug/h via INTRAVENOUS
  Administered 2016-07-10 – 2016-07-11 (×4): 275 ug/h via INTRAVENOUS
  Filled 2016-07-08 (×7): qty 250

## 2016-07-08 MED ORDER — NOREPINEPHRINE 4 MG/250ML-% IV SOLN
INTRAVENOUS | Status: AC
  Filled 2016-07-08: qty 250

## 2016-07-08 MED ORDER — PROPOFOL 10 MG/ML IV BOLUS
INTRAVENOUS | Status: DC | PRN
  Administered 2016-07-08: 100 mg via INTRAVENOUS

## 2016-07-08 MED ORDER — SODIUM CHLORIDE 0.9 % IV SOLN
INTRAVENOUS | Status: DC | PRN
  Administered 2016-07-08: 14:00:00 via INTRAVENOUS

## 2016-07-08 MED ORDER — NOREPINEPHRINE BITARTRATE 1 MG/ML IV SOLN
INTRAVENOUS | Status: DC | PRN
  Administered 2016-07-08: 25 ug/kg/min via INTRAVENOUS

## 2016-07-08 MED ORDER — FAMOTIDINE IN NACL 20-0.9 MG/50ML-% IV SOLN
20.0000 mg | INTRAVENOUS | Status: DC
  Administered 2016-07-08 – 2016-07-10 (×3): 20 mg via INTRAVENOUS
  Filled 2016-07-08 (×3): qty 50

## 2016-07-08 MED ORDER — CHLORHEXIDINE GLUCONATE 0.12% ORAL RINSE (MEDLINE KIT)
15.0000 mL | Freq: Two times a day (BID) | OROMUCOSAL | Status: DC
  Administered 2016-07-08 – 2016-07-11 (×6): 15 mL via OROMUCOSAL
  Filled 2016-07-08 (×6): qty 15

## 2016-07-08 MED ORDER — ANTISEPTIC ORAL RINSE SOLUTION (CORINZ)
7.0000 mL | Freq: Four times a day (QID) | OROMUCOSAL | Status: DC
  Administered 2016-07-08 – 2016-07-09 (×2): 7 mL via OROMUCOSAL
  Filled 2016-07-08 (×3): qty 7

## 2016-07-08 MED ORDER — NOREPINEPHRINE BITARTRATE 1 MG/ML IV SOLN
0.0000 ug/min | INTRAVENOUS | Status: DC
  Administered 2016-07-08: 25 ug/min via INTRAVENOUS

## 2016-07-08 MED ORDER — LEVOFLOXACIN IN D5W 750 MG/150ML IV SOLN
750.0000 mg | Freq: Once | INTRAVENOUS | Status: AC
  Administered 2016-07-08: 750 mg via INTRAVENOUS
  Filled 2016-07-08: qty 150

## 2016-07-08 MED ORDER — SODIUM CHLORIDE 0.9 % IV BOLUS (SEPSIS)
1000.0000 mL | Freq: Once | INTRAVENOUS | Status: AC
  Administered 2016-07-08: 1000 mL via INTRAVENOUS

## 2016-07-08 MED ORDER — IPRATROPIUM-ALBUTEROL 0.5-2.5 (3) MG/3ML IN SOLN
3.0000 mL | Freq: Four times a day (QID) | RESPIRATORY_TRACT | Status: AC
  Administered 2016-07-08 – 2016-07-10 (×9): 3 mL via RESPIRATORY_TRACT
  Filled 2016-07-08 (×8): qty 3

## 2016-07-08 MED ORDER — DEXTROSE 5 % IV SOLN
2.0000 g | Freq: Once | INTRAVENOUS | Status: DC
  Administered 2016-07-08: 2 g via INTRAVENOUS
  Filled 2016-07-08: qty 2

## 2016-07-08 MED ORDER — NOREPINEPHRINE BITARTRATE 1 MG/ML IV SOLN
10.0000 ug/min | Freq: Once | INTRAVENOUS | Status: AC
  Administered 2016-07-08: 10 ug/min via INTRAVENOUS

## 2016-07-08 MED ORDER — ONDANSETRON HCL 4 MG/2ML IJ SOLN
INTRAMUSCULAR | Status: DC | PRN
  Administered 2016-07-08: 4 mg via INTRAVENOUS

## 2016-07-08 MED ORDER — NOREPINEPHRINE 4 MG/250ML-% IV SOLN
INTRAVENOUS | Status: AC
  Administered 2016-07-08: 35 ug/min
  Filled 2016-07-08: qty 250

## 2016-07-08 MED ORDER — FENTANYL CITRATE (PF) 100 MCG/2ML IJ SOLN
50.0000 ug | INTRAMUSCULAR | Status: DC | PRN
  Administered 2016-07-11: 50 ug via INTRAVENOUS

## 2016-07-08 MED ORDER — FENTANYL CITRATE (PF) 2500 MCG/50ML IJ SOLN: 10.0000 ug/h | Status: DC

## 2016-07-08 MED ORDER — ANTISEPTIC ORAL RINSE SOLUTION (CORINZ): 7.0000 mL | Freq: Four times a day (QID) | OROMUCOSAL | Status: DC

## 2016-07-08 MED ORDER — ONDANSETRON HCL 4 MG/2ML IJ SOLN: 4.0000 mg | Freq: Four times a day (QID) | INTRAMUSCULAR | Status: DC | PRN

## 2016-07-08 MED ORDER — DIATRIZOATE MEGLUMINE & SODIUM 66-10 % PO SOLN: 15.0000 mL | Freq: Once | ORAL | Status: DC

## 2016-07-08 MED ORDER — FENTANYL CITRATE (PF) 100 MCG/2ML IJ SOLN
50.0000 ug | INTRAMUSCULAR | Status: DC | PRN
  Administered 2016-07-08: 50 ug via INTRAVENOUS
  Filled 2016-07-08: qty 2

## 2016-07-08 MED ORDER — ACETAMINOPHEN 325 MG PO TABS: 650.0000 mg | ORAL_TABLET | ORAL | Status: DC | PRN

## 2016-07-08 MED ORDER — MIDAZOLAM HCL 2 MG/2ML IJ SOLN
INTRAMUSCULAR | Status: DC | PRN
  Administered 2016-07-08: 0.5 mg via INTRAVENOUS

## 2016-07-08 MED ORDER — SODIUM BICARBONATE 8.4 % IV SOLN
150.0000 meq | Freq: Once | INTRAVENOUS | Status: AC
  Administered 2016-07-08: 150 meq via INTRAVENOUS

## 2016-07-08 MED ORDER — MAGNESIUM SULFATE 2 GM/50ML IV SOLN
2.0000 g | Freq: Once | INTRAVENOUS | Status: AC
  Administered 2016-07-08: 2 g via INTRAVENOUS
  Filled 2016-07-08: qty 50

## 2016-07-08 MED ORDER — DEXTROSE 5 % IV SOLN
0.0000 ug/min | INTRAVENOUS | Status: DC
  Administered 2016-07-08: 35 ug/min via INTRAVENOUS
  Administered 2016-07-09: 20 ug/min via INTRAVENOUS
  Administered 2016-07-09: 45 ug/min via INTRAVENOUS
  Administered 2016-07-10: 11 ug/min via INTRAVENOUS
  Administered 2016-07-11: 10 ug/min via INTRAVENOUS
  Filled 2016-07-08 (×5): qty 16

## 2016-07-08 MED ORDER — SUCCINYLCHOLINE CHLORIDE 20 MG/ML IJ SOLN
INTRAMUSCULAR | Status: DC | PRN
  Administered 2016-07-08: 100 mg via INTRAVENOUS

## 2016-07-08 MED ORDER — MIDAZOLAM HCL 2 MG/2ML IJ SOLN
1.0000 mg | INTRAMUSCULAR | Status: AC | PRN
  Administered 2016-07-08 (×3): 1 mg via INTRAVENOUS
  Filled 2016-07-08 (×3): qty 2

## 2016-07-08 MED ORDER — NOREPINEPHRINE 4 MG/250ML-% IV SOLN
INTRAVENOUS | Status: AC
  Administered 2016-07-08: 32 mg
  Filled 2016-07-08: qty 250

## 2016-07-08 MED ORDER — ENOXAPARIN SODIUM 40 MG/0.4ML ~~LOC~~ SOLN: 30.0000 mg | SUBCUTANEOUS | Status: DC

## 2016-07-08 MED ORDER — FENTANYL 2500MCG IN NS 250ML (10MCG/ML) PREMIX INFUSION
INTRAVENOUS | Status: AC
  Filled 2016-07-08: qty 250

## 2016-07-08 MED ORDER — MIDAZOLAM HCL 2 MG/2ML IJ SOLN
1.0000 mg | INTRAMUSCULAR | Status: DC | PRN
  Administered 2016-07-08 – 2016-07-09 (×3): 1 mg via INTRAVENOUS
  Filled 2016-07-08 (×4): qty 2

## 2016-07-08 MED ORDER — CHLORHEXIDINE GLUCONATE 0.12% ORAL RINSE (MEDLINE KIT): 15.0000 mL | Freq: Two times a day (BID) | OROMUCOSAL | Status: DC

## 2016-07-08 MED ORDER — DEXTROSE 50 % IV SOLN
25.0000 mL | INTRAVENOUS | Status: AC
  Administered 2016-07-08: 25 mL via INTRAVENOUS

## 2016-07-08 MED ORDER — SODIUM CHLORIDE 0.9 % IV SOLN
1.0000 g | Freq: Two times a day (BID) | INTRAVENOUS | Status: DC
  Administered 2016-07-08 – 2016-07-09 (×2): 1 g via INTRAVENOUS
  Filled 2016-07-08 (×4): qty 1

## 2016-07-08 MED ORDER — SODIUM CHLORIDE 0.9 % IV SOLN
0.0100 [IU]/min | INTRAVENOUS | Status: DC
  Administered 2016-07-08: 0.03 [IU]/min via INTRAVENOUS
  Administered 2016-07-09: 0.04 [IU]/min via INTRAVENOUS
  Filled 2016-07-08 (×3): qty 2

## 2016-07-08 MED ORDER — BUDESONIDE 0.25 MG/2ML IN SUSP: 0.2500 mg | RESPIRATORY_TRACT | Status: DC | PRN

## 2016-07-08 MED ORDER — PHENYLEPHRINE HCL 10 MG/ML IJ SOLN
INTRAMUSCULAR | Status: DC | PRN
  Administered 2016-07-08 (×4): 200 ug via INTRAVENOUS

## 2016-07-08 MED ORDER — SODIUM BICARBONATE 8.4 % IV SOLN
INTRAVENOUS | Status: AC
  Administered 2016-07-08: 22:00:00
  Filled 2016-07-08: qty 100

## 2016-07-08 MED ORDER — DEXTROSE 50 % IV SOLN
INTRAVENOUS | Status: AC
  Administered 2016-07-08: 25 mL
  Filled 2016-07-08: qty 50

## 2016-07-08 MED ORDER — HYDROCORTISONE NA SUCCINATE PF 100 MG IJ SOLR
100.0000 mg | Freq: Three times a day (TID) | INTRAMUSCULAR | Status: DC
  Administered 2016-07-08 – 2016-07-09 (×2): 100 mg via INTRAVENOUS
  Filled 2016-07-08 (×2): qty 2

## 2016-07-08 MED ORDER — SODIUM BICARBONATE 8.4 % IV SOLN
INTRAVENOUS | Status: AC
  Administered 2016-07-08: 22:00:00
  Filled 2016-07-08: qty 50

## 2016-07-08 MED ORDER — SODIUM CHLORIDE 0.9 % IV SOLN: 250.0000 mL | INTRAVENOUS | Status: DC | PRN

## 2016-07-08 SURGICAL SUPPLY — 20 items
BAG DRAIN CYSTO-URO LG1000N (MISCELLANEOUS) ×3 IMPLANT
CATH URETL 5X70 OPEN END (CATHETERS) ×3 IMPLANT
GLOVE BIO SURGEON STRL SZ7 (GLOVE) ×6 IMPLANT
GLOVE BIO SURGEON STRL SZ7.5 (GLOVE) ×3 IMPLANT
GOWN STRL REUS W/ TWL LRG LVL3 (GOWN DISPOSABLE) ×2 IMPLANT
GOWN STRL REUS W/TWL LRG LVL3 (GOWN DISPOSABLE) ×4
PACK CYSTO AR (MISCELLANEOUS) ×3 IMPLANT
PREP PVP WINGED SPONGE (MISCELLANEOUS) ×3 IMPLANT
SENSORWIRE 0.038 NOT ANGLED (WIRE) ×3
SET CYSTO W/LG BORE CLAMP LF (SET/KITS/TRAYS/PACK) ×3 IMPLANT
SOL .9 NS 3000ML IRR  AL (IV SOLUTION) ×2
SOL .9 NS 3000ML IRR UROMATIC (IV SOLUTION) ×1 IMPLANT
SOL PREP PVP 2OZ (MISCELLANEOUS) ×3
SOLUTION PREP PVP 2OZ (MISCELLANEOUS) ×1 IMPLANT
STENT URET 6FRX24 CONTOUR (STENTS) IMPLANT
STENT URET 6FRX26 CONTOUR (STENTS) ×3 IMPLANT
STENT URET 6FRX26 FIRM (Stent) ×3 IMPLANT
SURGILUBE 2OZ TUBE FLIPTOP (MISCELLANEOUS) ×3 IMPLANT
WATER STERILE IRR 1000ML POUR (IV SOLUTION) ×3 IMPLANT
WIRE SENSOR 0.038 NOT ANGLED (WIRE) ×1 IMPLANT

## 2016-07-08 NOTE — Anesthesia Procedure Notes (Signed)
Procedure Name: Intubation Date/Time: 06/27/2016 2:30 PM Performed by: Johnna Acosta Pre-anesthesia Checklist: Patient identified, Emergency Drugs available, Suction available, Patient being monitored and Timeout performed Patient Re-evaluated:Patient Re-evaluated prior to inductionOxygen Delivery Method: Circle system utilized Preoxygenation: Pre-oxygenation with 100% oxygen Intubation Type: IV induction, Rapid sequence and Cricoid Pressure applied Laryngoscope Size: Miller and 2 Grade View: Grade III Tube type: Oral Tube size: 7.0 mm Number of attempts: 1 Airway Equipment and Method: Stylet Placement Confirmation: positive ETCO2 and breath sounds checked- equal and bilateral Secured at: 21 cm Tube secured with: Tape Dental Injury: Teeth and Oropharynx as per pre-operative assessment

## 2016-07-08 NOTE — Progress Notes (Signed)
Pt in stable condition on arrival. On 4L O2. 25 mcg/kg/min. Pt is alert and oriented x4.  Report given to OR staff.  Pt transported to OR by OR staff.   Lactic acid resulted at 4.4. Dr. Stevenson Clinch notified.  No new orders at this time.  Will continue to assess.

## 2016-07-08 NOTE — ED Triage Notes (Signed)
Pt arrives to ED via [facility]. Per EMS they were called out for altered mental status. EMS states that pt is normally A&O x4 and drives herself around in a motorized wheelchair. Pt presents as lethargic at this time with a BP 60/40. 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 Pt presents with a 20g to L ac, and a 22 to L wrist, started by EMS. EMS also states that patient recently had unknown kidney stone procedure.

## 2016-07-08 NOTE — Progress Notes (Signed)
RR TO 30 & FIO2 to .50 per M. Patria Mane, NP based on latest abg.

## 2016-07-08 NOTE — ED Provider Notes (Addendum)
[facility] Emergency Department Provider Note   ____________________________________________   First MD Initiated Contact with Patient 06/28/2016 [phone]     (approximate)  I have reviewed the triage vital signs and the nursing notes.   HISTORY  Chief Complaint Code Sepsis    HPI Suzanne Ewing is a 70 y.o. female with a history of recurrent urinary tract infections as well as nephrolithiasis is presenting with altered mental status.  The patient was found this morning to be hypotensive with a pressure in the 60s over 40s. She is normally awake and alert and drives a motor scooter but was found to be somnolent and minimally responsive but arousable.  In route, 2 large bore IVs were started and she was given a 250 cc bolus of fluids but with pressures still in the 60s. After speaking with family it was found that the patient about a week and half ago had a lithotripsy and then earlier this week started complaining of the feeling that she was incompletely voiding.  She had had a urinalysis sent which showed positive nitrite as well as leuk trase but grew mixed flora. She was not started on antibiotics according to the family. She had a repeat sample sent yesterday but results have not been posted.   Past Medical History:  Diagnosis Date  . Anemia   . Anemia   . Anxiety   . Arthritis   . CAD (coronary artery disease)   . Candidiasis   . CHF (congestive heart failure) ([place])   . Chronic kidney disease   . Chronic pain   . COPD (chronic obstructive pulmonary disease) ([place])   . Coronary artery disease   . Depression   . Diabetes mellitus without complication ([place])   . Edema   . GERD (gastroesophageal reflux disease)   . GERD (gastroesophageal reflux disease)   . Heart disease   . Heel ulcer ([place])   . Hyperkalemia   . Hyperlipidemia   . Hypertension   . Long term current use of antibiotics   . Malnutrition ([place])   . MRSA (methicillin resistant  Staphylococcus aureus)   . Myocardial infarction ([place])   . Necrotizing fasciitis ([place])   . Nephrolithiasis   . Osteoporosis   . Sacral pressure ulcer   . Seasonal allergies   . Shortness of breath   . Ulcers of both lower legs ([place])    Non-pressure related  . Upper respiratory infection   . Urinary retention   . UTI (lower urinary tract infection)     Patient Active Problem List   Diagnosis Date Noted  . Sepsis ([place]) 06/22/2016  . Proteus (mirabilis) (m[place]ii) as the cause of diseases classified elsewhere 12/06/2015  . Infection due to ESBL-producing Escherichia coli 12/06/2015  . Atherosclerotic peripheral vascular disease ([place]) 12/06/2015  . Ulcer of left heel ([place]) 12/06/2015  . Bladder stones 12/06/2015  . Pleural effusion, right 12/06/2015  . S/P thoracentesis 12/06/2015  . Liver cirrhosis ([place]) 12/06/2015  . Ascites 12/06/2015  . Hyponatremia 12/06/2015  . Encephalopathy, metabolic [id number]  . Acute respiratory failure with hypoxia and hypercapnia ([place]) 12/06/2015  . Acute on chronic systolic CHF (congestive heart failure) ([place]) 12/06/2015  . Dysphagia 12/06/2015  . Failure to thrive in adult 12/06/2015  . Erosion of intestine 12/06/2015  . 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 Dyspnea   . Hepatic cirrhosis ([place])   . Acute pyelonephritis 11/30/2015  . Sepsis secondary to UTI ([place]) 06/12/2015  .  Cellulitis and abscess 06/12/2015  . Acute renal failure ([place]) 06/12/2015  . Hyperkalemia 06/12/2015  . Anemia 06/12/2015  . CAD (coronary artery disease) 06/12/2015  . DM (diabetes mellitus) ([place]) 06/12/2015  . HTN (hypertension) 06/12/2015  . Rheumatoid arthritis ([place]) 06/12/2015  . GERD (gastroesophageal reflux disease) 06/12/2015  . DM2 (diabetes mellitus, type 2) ([place]) 06/12/2015    Past Surgical History:  Procedure Laterality Date  . BACK SURGERY    . CARPAL TUNNEL RELEASE Bilateral   . CHOLECYSTECTOMY    . CORONARY ARTERY BYPASS  GRAFT    . JOINT REPLACEMENT    . KNEE SURGERY Left   . PERIPHERAL VASCULAR CATHETERIZATION Left 01/03/2016   Procedure: Lower Extremity Angiography;  Surgeon: Algernon Huxley, MD;  Location: [place] [facility];  Service: Cardiovascular;  Laterality: Left;  . PERIPHERAL VASCULAR CATHETERIZATION  01/03/2016   Procedure: Lower Extremity Intervention;  Surgeon: Algernon Huxley, MD;  Location: [place] [facility];  Service: Cardiovascular;;  . SHOULDER SURGERY      Prior to Admission medications   Medication Sig Start Date End Date Taking? Authorizing Provider  acetaminophen (TYLENOL) 325 MG tablet Take 2 tablets (650 mg total) by mouth every 6 (six) hours as needed for mild pain (or Fever >/= 101). Patient taking differently: Take 650 mg by mouth every 6 (six) hours as needed for mild pain or headache.  06/18/15  Yes Glendon Axe, MD  acetaminophen (TYLENOL) 650 MG CR tablet Take 650 mg by mouth 3 (three) times daily.   Yes Historical Provider, MD  alum & mag hydroxide-simeth (GERI-LANTA) 200-200-20 MG/5ML suspension Take 30 mLs by mouth every 4 (four) hours as needed for indigestion or heartburn.   Yes Historical Provider, MD  antiseptic oral rinse (CPC / CETYLPYRIDINIUM CHLORIDE 0.05%) 0.05 % LIQD solution 7 mLs by Mouth Rinse route 2 times daily at 12 noon and 4 pm. 12/06/15  Yes Theodoro Grist, MD  ascorbic acid (VITAMIN C) 1000 MG tablet Take 1,000 mg by mouth daily.   Yes Historical Provider, MD  Cholecalciferol (VITAMIN D3) 50000 units CAPS Take 50,000 Units by mouth every Wednesday.   Yes Historical Provider, MD  cyanocobalamin (,VITAMIN B-12,) 1000 MCG/ML injection Inject 1,000 mcg into the muscle every 30 (thirty) days. Given on the 26th day of the month   Yes Historical Provider, MD  dextromethorphan-guaiFENesin (MUCINEX DM) 30-600 MG 12hr tablet Take 1 tablet by mouth at bedtime.   Yes Historical Provider, MD  diltiazem (CARDIZEM SR) 120 MG 12 hr capsule Take 120 mg by mouth 2 (two) times daily.  07/06/16  Yes Historical Provider, MD  diphenhydrAMINE (BENADRYL) 25 mg capsule Take 25 mg by mouth every 6 (six) hours as needed for itching.   Yes Historical Provider, MD  escitalopram (LEXAPRO) 10 MG tablet Take 10 mg by mouth daily.   Yes Historical Provider, MD  etodolac (LODINE) 400 MG tablet Take 400 mg by mouth 2 (two) times daily.   Yes Historical Provider, MD  ferrous sulfate 325 (65 FE) MG tablet Take 325 mg by mouth 3 (three) times daily.    Yes Historical Provider, MD  Fluticasone-Salmeterol (ADVAIR) 100-50 MCG/DOSE AEPB Inhale 1 puff into the lungs 2 (two) times daily as needed. For shortness of breath. *Wait 5 minutes between inhalers*   Yes Historical Provider, MD  folic acid (FOLVITE) Q000111Q MCG tablet Take 800 mcg by mouth daily.   Yes Historical Provider, MD  furosemide (LASIX) 20 MG tablet Take 20 mg by mouth 2 (  two) times daily.   Yes Historical Provider, MD  guaiFENesin (MUCINEX) 600 MG 12 hr tablet Take 300 mg by mouth daily.   Yes Historical Provider, MD  HYDROcodone-acetaminophen (NORCO/VICODIN) 5-325 MG tablet Take 1 tablet by mouth every 4 (four) hours as needed for moderate pain or severe pain.   Yes Historical Provider, MD  hydroxychloroquine (PLAQUENIL) 200 MG tablet Take 400 mg by mouth daily.   Yes Historical Provider, MD  ibandronate (BONIVA) 3 MG/3ML SOLN injection Inject 3 mg into the vein every 3 (three) months. Taken on the 13th day of the month   Yes Historical Provider, MD  insulin lispro (HUMALOG) 100 UNIT/ML cartridge Inject into the skin 3 (three) times daily before meals. Use per sliding scale:  If blood sugar is less than 60 , Call MD 151-200= 2 units, 201-250= 4 units, 251-300= 6 units, 301-350= 8 units, 351-400= 10 units, 401-450= 12 units,     > 450= Call MD   Yes Historical Provider, MD  lisinopril (PRINIVIL,ZESTRIL) 5 MG tablet Take 1 tablet (5 mg total) by mouth at bedtime. 12/06/15  Yes Theodoro Grist, MD  LORazepam (ATIVAN) 0.5 MG tablet Take 0.5 mg  by mouth every 8 (eight) hours as needed for anxiety.    Yes Historical Provider, MD  metFORMIN (GLUCOPHAGE) 500 MG tablet Take 500 mg by mouth 2 (two) times daily.   Yes Historical Provider, MD  metoprolol tartrate (LOPRESSOR) 25 MG tablet Take 25 mg by mouth every 12 (twelve) hours.    Yes Historical Provider, MD  Multiple Vitamins-Minerals (CENTRUM SILVER PO) Take 1 tablet by mouth daily.    Yes Historical Provider, MD  nystatin (MYCOSTATIN) 100000 UNIT/ML suspension Take 5 mLs by mouth 4 (four) times daily. *Swish and swallow*   Yes Historical Provider, MD  Omega-3 Fatty Acids (FISH OIL) 500 MG CAPS Take 1,000 mg by mouth daily.   Yes Historical Provider, MD  omeprazole (PRILOSEC) 20 MG capsule Take 20 mg by mouth every 12 (twelve) hours.    Yes Historical Provider, MD  oxybutynin (DITROPAN) 5 MG tablet Take 5 mg by mouth every 12 (twelve) hours.    Yes Historical Provider, MD  pentoxifylline (TRENTAL) 400 MG CR tablet Take 400 mg by mouth 3 (three) times daily with meals.   Yes Historical Provider, MD  potassium chloride (KLOR-CON M10) 10 MEQ tablet Take 1 tablet (10 mEq total) by mouth daily. Take with Lasix 06/18/15  Yes Glendon Axe, MD  predniSONE (DELTASONE) 10 MG tablet Take 10 mg by mouth daily.   Yes Historical Provider, MD  promethazine (PHENERGAN) 25 MG tablet Take 25 mg by mouth every 6 (six) hours as needed for nausea or vomiting.   Yes Historical Provider, MD  tiotropium (SPIRIVA) 18 MCG inhalation capsule Place 1 capsule (18 mcg total) into inhaler and inhale daily. 12/06/15  Yes Theodoro Grist, MD  traMADol (ULTRAM) 50 MG tablet Take 1 tablet (50 mg total) by mouth every 4 (four) hours as needed for moderate pain. Patient taking differently: Take 50 mg by mouth every 4 (four) hours as needed for moderate pain. Mild to moderate pain. 12/06/15  Yes Theodoro Grist, MD  vitamin A 8000 UNIT capsule Take 8,000 Units by mouth daily.   Yes Historical Provider, MD  albuterol (PROVENTIL) (2.5  MG/3ML) 0.083% nebulizer solution Take 3 mLs (2.5 mg total) by nebulization every 6 (six) hours as needed for wheezing or shortness of breath. 12/06/15   Theodoro Grist, MD  aspirin EC 81 MG EC  tablet Take 1 tablet (81 mg total) by mouth daily. 06/18/15   Glendon Axe, MD  bismuth subsalicylate (PEPTO BISMOL) 262 MG/15ML suspension Take 30 mLs by mouth every 4 (four) hours as needed for diarrhea or loose stools. 06/18/15   Glendon Axe, MD  loperamide (IMODIUM) 2 MG capsule Take 1 capsule (2 mg total) by mouth every 6 (six) hours as needed for diarrhea or loose stools. 06/18/15   Glendon Axe, MD  magic mouthwash SOLN Take 10 mLs by mouth 4 (four) times daily as needed (candidiasis). 12/06/15   Theodoro Grist, MD    Allergies Macrobid [nitrofurantoin monohyd macro]; Penicillins; Tape; Azithromycin; Clarithromycin; Nitroglycerin; Leflunomide; Morphine; and Naproxen  Family History  Problem Relation Age of Onset  . Stroke Mother   . Diabetes type II Sister   . Hypertension Sister     Social History Social History  Substance Use Topics  . Smoking status: Former Smoker    Packs/day: 0.50    Years: 20.00    Types: Cigarettes    Quit date: 06/03/2014  . Smokeless tobacco: Never Used  . Alcohol use No    Review of Systems  Level V caveat secondary to being critically ill this time.  ____________________________________________   PHYSICAL EXAM:  VITAL SIGNS: ED Triage Vitals  Enc Vitals Group     BP      Pulse      Resp      Temp      Temp src      SpO2      Weight      Height      Head Circumference      Peak Flow      Pain Score      Pain Loc      Pain Edu?      Excl. in [place]?     Constitutional: Alert. Weak and ill-appearing. Eyes: Conjunctivae are normal. PERRL. EOMI. Head: Atraumatic. Nose: No congestion/rhinnorhea. Mouth/Throat: Mucous membranes are moist.   Neck: No stridor.   Cardiovascular: Normal rate, regular rhythm. Grossly normal heart sounds.     Respiratory: Normal respiratory effort.  No retractions. Lungs CTAB. Gastrointestinal: Soft and nontender. Unclear if distended secondary to obese abdomen. Small amount of brown stool from the abdomen which is heme-negative. Patient has a colostomy in place to the left side of the abdomen without any surrounding erythema, induration or pus. Musculoskeletal: Mild bilateral lower extremity edema. Neurologic: Grossly neurologically intact. Skin:  Multiple well healed ulcerations to the bilateral feet. Sacrum is intact but with a small amount of erythema.     ____________________________________________   LABS (all labs ordered are listed, but only abnormal results are displayed)  Labs Reviewed  COMPREHENSIVE METABOLIC PANEL - Abnormal; Notable for the following:       Result Value   Chloride 113 (*)    CO2 18 (*)    BUN 45 (*)    Creatinine, Ser 1.50 (*)    Total Protein 5.6 (*)    Albumin 2.3 (*)    AST 76 (*)    GFR calc non Af Amer 34 (*)    GFR calc Af Amer 40 (*)    All other components within normal limits  CBC WITH DIFFERENTIAL/PLATELET - Abnormal; Notable for the following:    RBC 3.66 (*)    Hemoglobin 9.1 (*)    HCT 29.0 (*)    MCV 79.3 (*)    MCH 24.9 (*)    MCHC 31.4 (*)  RDW 17.9 (*)    Platelets 143 (*)    Lymphs Abs 0.1 (*)    Monocytes Absolute 0.1 (*)    All other components within normal limits  LACTIC ACID, PLASMA - Abnormal; Notable for the following:    Lactic Acid, Venous 5.1 (*)    All other components within normal limits  TROPONIN I - Abnormal; Notable for the following:    Troponin I 0.06 (*)    All other components within normal limits  URINALYSIS COMPLETEWITH MICROSCOPIC ([facility] ONLY) - Abnormal; Notable for the following:    Color, Urine YELLOW (*)    APPearance TURBID (*)    Protein, ur 100 (*)    Leukocytes, UA 3+ (*)    Bacteria, UA RARE (*)    Squamous Epithelial / LPF 0-5 (*)    All other components within normal limits  CULTURE,  BLOOD (ROUTINE X 2)  CULTURE, BLOOD (ROUTINE X 2)  URINE CULTURE  MRSA PCR SCREENING  GLUCOSE, CAPILLARY  PROCALCITONIN  LACTIC ACID, PLASMA  COMPREHENSIVE METABOLIC PANEL  CBC WITH DIFFERENTIAL/PLATELET   ____________________________________________  EKG  ED ECG REPORT I, [place],  Youlanda Roys, the attending physician, personally viewed and interpreted this ECG.   Date: 07/18/2016  EKG Time: 910  Rate: 94  Rhythm: normal sinus rhythm  Axis: Normal axis  Intervals:right bundle branch block and left posterior fascicular block  ST&T Change: No ST segment elevation. Mild depression in V2. T wave inversions in aVL, V2 through V4. No significant change from previous EKGs on the record.  ____________________________________________  RADIOLOGY  DG Chest 1 View (Accession [id number]) (Order [id number])  Imaging  Date: 07/16/2016 Department: [facility] EMERGENCY DEPARTMENT Released By/Authorizing: Orbie Pyo, MD (auto-released)  PACS Images   Show images for DG Chest 1 View  Study Result   CLINICAL DATA:  Hypotensive.  Abdominal distension.  EXAM: CHEST 1 VIEW  COMPARISON:  12/06/2015  FINDINGS: Mildly degraded exam due to AP portable technique and patient body habitus. Prior median sternotomy. Numerous leads and wires project over the chest. Midline trachea. Moderate cardiomegaly. Small to moderate right pleural effusion is increased. possible mild loculation laterally. No pneumothorax. Mild interstitial edema is asymmetric, worse on the right. Lower lobe predominant right-sided airspace disease.  IMPRESSION: Cardiomegaly with mild interstitial edema.  Increase in moderate right pleural effusion. Possible loculation laterally. Recommend attention on follow-up.  Right base atelectasis or infection.   Electronically Signed   By: Abigail Miyamoto M.D.   On: 06/25/2016 09:47    DG Chest Portable 1 View (Accession [id number])  (Order [id number])  Imaging  Date: 06/20/2016 Department: [facility] EMERGENCY DEPARTMENT Released By: Arlyn Leak, RN (auto-released) Authorizing: Orbie Pyo, MD  PACS Images   Show images for DG Chest Portable 1 View  Study Result   CLINICAL DATA:  Status post central line placement.  EXAM: PORTABLE CHEST 1 VIEW  COMPARISON:  07/07/2016 at 9:11 a.m.  FINDINGS: Right internal jugular central venous line catheter tip projects in the mid superior vena cava. No pneumothorax.  Right-sided pleural effusion and mild interstitial thickening is stable. No new lung abnormalities.  IMPRESSION: New right internal jugular central venous line tip projects in the mid superior vena cava. No pneumothorax. No other change from the earlier study.   Electronically Signed   By: Lajean Manes M.D.   On: 07/09/2016 11:42     ____________________________________________   PROCEDURES  Procedure(s) performed:      .Critical  Care Performed by: Larae Grooms MATTHEW Authorized by: [place], [place] performed:  CRITICAL CARE Performed by: Doran Stabler   Total critical care time: 35 minutes  Critical care time was exclusive of separately billable procedures and treating other patients.  Critical care was necessary to treat or prevent imminent or life-threatening deterioration.  Critical care was time spent personally by me on the following activities: development of treatment plan with patient and/or surrogate as well as nursing, discussions with consultants, evaluation of patient's response to treatment, examination of patient, obtaining history from patient or surrogate, ordering and performing treatments and interventions, ordering and review of laboratory studies, ordering and review of radiographic studies, pulse oximetry and re-evaluation of patient's  condition.   ____________________________________________   INITIAL IMPRESSION / ASSESSMENT AND PLAN / ED COURSE  Pertinent labs & imaging results that were available during my care of the patient were reviewed by me and considered in my medical decision making (see chart for details).  Clinical Course   I discussed possible central line insertion with the family and they're agreeable. We reviewed potential consultation such as bleeding infection and pneumothorax. The patient does not have a DO NOT RESUSCITATE order however the patient's husband said that she would not want to be placed on a ventilator.  ----------------------------------------- 11:00 AM on 07/01/2016 ----------------------------------------- Dr. Stevenson Clinch evaluated the patient at the bedside and will sent the patient to the ICU. However, he is requesting a CAT scan be done prior to transfer to the ICU in case the patient has reversible cause for her septic shock such as an obstructing renal stone.  ----------------------------------------- 11:58 AM on 06/28/2016 -----------------------------------------  Multiple options were considered for imaging of this patient because we are having technical difficulties with her CAT scan however the CAT scan is now back on line and the patient will be taken immediately to CAT scan. I discussed the case with Dr. Louis Meckel who said that a renal ultrasound would suffice. However, soon after his discussion I was informed CAT scan or was on line.    ____________________________________________   FINAL CLINICAL IMPRESSION(S) / ED DIAGNOSES  Final diagnoses:  Septic shock ([place])  UTI.    NEW MEDICATIONS STARTED DURING THIS VISIT:  New Prescriptions   No medications on file     Note:  This document was prepared using Dragon voice recognition software and may include unintentional dictation errors.    Orbie Pyo, MD 06/26/2016 1159  Right-sided central line placed  by Dr. Kerman Passey.  Initially Levophed at 10 which is now at 20 because of persistent hypotension.   Orbie Pyo, MD 07/05/2016 1200  CT RENAL STONE STUDY (Accession [id number]) (Order [id number])  Imaging  Date: 07/16/2016 Department: [place] ICU/CCU Released By/Authorizing: Vilinda Boehringer, MD (auto-released)  PACS Images   Show images for CT RENAL STONE STUDY  Study Result   CLINICAL DATA:  Altered mental status with lethargy. Reported recent unknown kidney stone procedure.  EXAM: CT ABDOMEN AND PELVIS WITHOUT CONTRAST  TECHNIQUE: Multidetector CT imaging of the abdomen and pelvis was performed following the standard protocol without IV contrast.  COMPARISON:  CT 11/29/2015.  Limited abdominal ultrasound 05/26/2016  FINDINGS: Lower chest: Moderate to large right and small left pleural effusions are similar to the prior examination. There is no pericardial effusion. Patient is status post median sternotomy. There is atherosclerosis of the aorta and coronary arteries. Subtotal collapse of the right  lower lobe and lesser atelectasis in the right middle and left lower lobes are similar to the prior study.  Hepatobiliary: Morphologic changes of cirrhosis are again noted. No focal hepatic abnormalities are demonstrated on noncontrast imaging. Prior cholecystectomy. The biliary system appears grossly unchanged.  Pancreas: The pancreas is atrophied without apparent focal abnormality or surrounding inflammation.  Spleen: Normal in size without focal abnormality.  Adrenals/Urinary Tract: Stable 2.4 x 1.8 cm left adrenal nodule on image 32. The right adrenal gland appears normal. Nonobstructing small renal calculi on the right are unchanged. There is no right-sided hydronephrosis. There is a 14 mm calculus in the lower pole of the left kidney. There is new left-sided hydronephrosis and hydroureter with associated asymmetric perinephric  soft tissue stranding. There are obstructing calculi in the distal left ureter, measuring up to 9 mm on image 86. There are additional smaller stone fragments in the left ureter, best seen on the reformatted images. There are also small right-sided bladder calculi in. The bladder remains decompressed by a Foley catheter.  Stomach/Bowel: Descending colostomy noted. The colon is decompressed. No bowel wall thickening, significant distention or focal surrounding inflammatory change identified. No residual herniated bowel seen.  Vascular/Lymphatic: There are several prominent retroperitoneal lymph nodes which are similar to the prior examination. There is diffuse aortic and branch vessel atherosclerosis.  Reproductive: The uterus and adnexa appear unchanged.  Other: There is a moderate amount of ascites which has increased in volume compared with the prior study. There are multiple hernias of the lower anterior abdominal wall containing fat and ascites. No residual herniated bowel. There is some soft tissue edema throughout the abdominal wall which is asymmetric to the right.  Musculoskeletal: No acute or significant osseous findings. Old rib fractures are noted on the right. There are degenerative and postsurgical changes in the lumbar spine.  IMPRESSION: 1. There are several obstructing calculi and stone fragments within the distal left ureter with associated hydronephrosis. 2. Bilateral renal calculi and small bladder calculi are also noted. 3. Hepatic cirrhosis with increasing ascites. Right-greater-than-left pleural effusions have not significantly changed in volume. 4. Stable postsurgical changes status post descending colostomy. Multiple lower abdominal wall hernias are grossly unchanged. 5. Aortic atherosclerosis. 6. Stable left adrenal adenoma.   Electronically Signed   By: Richardean Sale M.D.   On: 06/28/2016 13:11    Patient with distal left ureter  obstructing calculi which may be the source of the patient's septic shock because of the obstruction. Patient will be dispositioned to the operating room. Dr. Louis Meckel of urology discussed the case with radiology. Also, I updated Dr. Leonidas Romberg of the intensive care unit.      Orbie Pyo, MD 07/10/2016 1336

## 2016-07-08 NOTE — Consult Note (Signed)
I have been asked to see the patient by Dr. Vilinda Boehringer, MD, for evaluation and management of urosepsis.  History of present illness: 70 year old female who was transported to the emergency department this morning from her nursing facility hypotensive with systolic blood pressures [id number]. She was found minimally responsive. She was noted to be in acute renal failure with urinalysis concerning for infection.  The patient recently underwent a left-sided ESWL for left ureteral lithiasis. This was performed at [facility]. Unfortunately, her images are unavailable to me at this time. However, I was consult above the emergency room physician and at that time CT scan was unavailable and as such I recommended ultrasound. Ultimately, the CT scanner was brought back on line and she underwent a CT scan, stone protocol. This demonstrated a large left distal ureteral stone with several smaller stones behind it. She had associated proximal hydronephrosis. Additionally, in the ED the patient's lactate was noted to be 5. Her blood pressure was low and she was on 30 mics of phenylephrine.  The patient was admitted to the ICU service.    Review of systems: A 12 point comprehensive review of systems was obtained and is negative unless otherwise stated in the history of present illness.  Patient Active Problem List   Diagnosis Date Noted  . Sepsis ([place]) 07/01/2016  . Proteus (mirabilis) (morganii) as the cause of diseases classified elsewhere 12/06/2015  . Infection due to ESBL-producing Escherichia coli 12/06/2015  . Atherosclerotic peripheral vascular disease ([place]) 12/06/2015  . Ulcer of left heel ([place]) 12/06/2015  . Bladder stones 12/06/2015  . Pleural effusion, right 12/06/2015  . S/P thoracentesis 12/06/2015  . Liver cirrhosis ([place]) 12/06/2015  . Ascites 12/06/2015  . Hyponatremia 12/06/2015  . Encephalopathy, metabolic [id number]  . Acute respiratory failure with hypoxia and hypercapnia ([place]) 12/06/2015  .  Acute on chronic systolic CHF (congestive heart failure) ([place]) 12/06/2015  . Dysphagia 12/06/2015  . Failure to thrive in adult 12/06/2015  . Erosion of intestine 12/06/2015  . 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 Dyspnea   . Hepatic cirrhosis ([place])   . Acute pyelonephritis 11/30/2015  . Sepsis secondary to UTI ([place]) 06/12/2015  . Cellulitis and abscess 06/12/2015  . Acute renal failure ([place]) 06/12/2015  . Hyperkalemia 06/12/2015  . Anemia 06/12/2015  . CAD (coronary artery disease) 06/12/2015  . DM (diabetes mellitus) ([place]) 06/12/2015  . HTN (hypertension) 06/12/2015  . Rheumatoid arthritis ([place]) 06/12/2015  . GERD (gastroesophageal reflux disease) 06/12/2015  . DM2 (diabetes mellitus, type 2) ([place]) 06/12/2015    No current facility-administered medications on file prior to encounter.    Current Outpatient Prescriptions on File Prior to Encounter  Medication Sig Dispense Refill  . acetaminophen (TYLENOL) 325 MG tablet Take 2 tablets (650 mg total) by mouth every 6 (six) hours as needed for mild pain (or Fever >/= 101). (Patient taking differently: Take 650 mg by mouth every 6 (six) hours as needed for mild pain or headache. ) 60 tablet 0  . alum & mag hydroxide-simeth (GERI-LANTA) 200-200-20 MG/5ML suspension Take 30 mLs by mouth every 4 (four) hours as needed for indigestion or heartburn.    Marland Kitchen antiseptic oral rinse (CPC / CETYLPYRIDINIUM CHLORIDE 0.05%) 0.05 % LIQD solution 7 mLs by Mouth Rinse route 2 times daily at 12 noon and 4 pm. 354 mL 0  . ascorbic acid (VITAMIN C) 1000 MG tablet Take 1,000 mg by mouth daily.    . cyanocobalamin (,VITAMIN B-12,) 1000 MCG/ML injection  Inject 1,000 mcg into the muscle every 30 (thirty) days. Given on the 26th day of the month    . dextromethorphan-guaiFENesin (MUCINEX DM) 30-600 MG 12hr tablet Take 1 tablet by mouth at bedtime.    Marland Kitchen escitalopram (LEXAPRO) 10 MG tablet Take 10 mg by mouth daily.    Marland Kitchen etodolac (LODINE) 400  MG tablet Take 400 mg by mouth 2 (two) times daily.    . ferrous sulfate 325 (65 FE) MG tablet Take 325 mg by mouth 3 (three) times daily.     . Fluticasone-Salmeterol (ADVAIR) 100-50 MCG/DOSE AEPB Inhale 1 puff into the lungs 2 (two) times daily as needed. For shortness of breath. *Wait 5 minutes between inhalers*    . furosemide (LASIX) 20 MG tablet Take 20 mg by mouth 2 (two) times daily.    Marland Kitchen guaiFENesin (MUCINEX) 600 MG 12 hr tablet Take 300 mg by mouth daily.    Marland Kitchen ibandronate (BONIVA) 3 MG/3ML SOLN injection Inject 3 mg into the vein every 3 (three) months. Taken on the 13th day of the month    . lisinopril (PRINIVIL,ZESTRIL) 5 MG tablet Take 1 tablet (5 mg total) by mouth at bedtime. 30 tablet 6  . LORazepam (ATIVAN) 0.5 MG tablet Take 0.5 mg by mouth every 8 (eight) hours as needed for anxiety.     . metoprolol tartrate (LOPRESSOR) 25 MG tablet Take 25 mg by mouth every 12 (twelve) hours.     . Multiple Vitamins-Minerals (CENTRUM SILVER PO) Take 1 tablet by mouth daily.     Marland Kitchen omeprazole (PRILOSEC) 20 MG capsule Take 20 mg by mouth every 12 (twelve) hours.     Marland Kitchen oxybutynin (DITROPAN) 5 MG tablet Take 5 mg by mouth every 12 (twelve) hours.     . potassium chloride (KLOR-CON M10) 10 MEQ tablet Take 1 tablet (10 mEq total) by mouth daily. Take with Lasix 30 tablet 0  . promethazine (PHENERGAN) 25 MG tablet Take 25 mg by mouth every 6 (six) hours as needed for nausea or vomiting.    . tiotropium (SPIRIVA) 18 MCG inhalation capsule Place 1 capsule (18 mcg total) into inhaler and inhale daily. 30 capsule 12  . traMADol (ULTRAM) 50 MG tablet Take 1 tablet (50 mg total) by mouth every 4 (four) hours as needed for moderate pain. (Patient taking differently: Take 50 mg by mouth every 4 (four) hours as needed for moderate pain. Mild to moderate pain.) 30 tablet 0  . albuterol (PROVENTIL) (2.5 MG/3ML) 0.083% nebulizer solution Take 3 mLs (2.5 mg total) by nebulization every 6 (six) hours as needed for  wheezing or shortness of breath. 75 mL 12  . aspirin EC 81 MG EC tablet Take 1 tablet (81 mg total) by mouth daily. 30 tablet 0  . bismuth subsalicylate (PEPTO BISMOL) 262 MG/15ML suspension Take 30 mLs by mouth every 4 (four) hours as needed for diarrhea or loose stools. 360 mL 0  . loperamide (IMODIUM) 2 MG capsule Take 1 capsule (2 mg total) by mouth every 6 (six) hours as needed for diarrhea or loose stools. 30 capsule 0  . magic mouthwash SOLN Take 10 mLs by mouth 4 (four) times daily as needed (candidiasis). 200 mL 6    Past Medical History:  Diagnosis Date  . Anemia   . Anemia   . Anxiety   . Arthritis   . CAD (coronary artery disease)   . Candidiasis   . CHF (congestive heart failure) ([place])   . Chronic kidney disease   .  Chronic pain   . COPD (chronic obstructive pulmonary disease) ([place])   . Coronary artery disease   . Depression   . Diabetes mellitus without complication ([place])   . Edema   . GERD (gastroesophageal reflux disease)   . GERD (gastroesophageal reflux disease)   . Heart disease   . Heel ulcer ([place])   . Hyperkalemia   . Hyperlipidemia   . Hypertension   . Long term current use of antibiotics   . Malnutrition ([place])   . MRSA (methicillin resistant Staphylococcus aureus)   . Myocardial infarction ([place])   . Necrotizing fasciitis ([place])   . Nephrolithiasis   . Osteoporosis   . Sacral pressure ulcer   . Seasonal allergies   . Shortness of breath   . Ulcers of both lower legs ([place])    Non-pressure related  . Upper respiratory infection   . Urinary retention   . UTI (lower urinary tract infection)     Past Surgical History:  Procedure Laterality Date  . BACK SURGERY    . CARPAL TUNNEL RELEASE Bilateral   . CHOLECYSTECTOMY    . CORONARY ARTERY BYPASS GRAFT    . JOINT REPLACEMENT    . KNEE SURGERY Left   . PERIPHERAL VASCULAR CATHETERIZATION Left 01/03/2016   Procedure: Lower Extremity Angiography;  Surgeon: Algernon Huxley, MD;  Location: [facility];  Service: Cardiovascular;  Laterality: Left;  . PERIPHERAL VASCULAR CATHETERIZATION  01/03/2016   Procedure: Lower Extremity Intervention;  Surgeon: Algernon Huxley, MD;  Location: [facility];  Service: Cardiovascular;;  . SHOULDER SURGERY      Social History  Substance Use Topics  . Smoking status: Former Smoker    Packs/day: 0.50    Years: 20.00    Types: Cigarettes    Quit date: 06/03/2014  . Smokeless tobacco: Never Used  . Alcohol use No    Family History  Problem Relation Age of Onset  . Stroke Mother   . Diabetes type II Sister   . Hypertension Sister     PE: Vitals:   07/20/2016 1334 07/17/2016 1335 06/27/2016 1345 07/20/2016 1400  BP: (!) 117/55 (!) 117/55 97/66 109/78  Pulse: (!) 104 (!) 103 (!) 104 (!) 104  Resp: 17 (!) 27 (!) 22 19  Temp: 98 F (36.7 C) 99.7 F (37.6 C)    TempSrc: Oral Oral    SpO2: 98% 100% 98% 100%  Weight: 90.2 kg (198 lb 13.7 oz)     Height: 5\' 6"  (1.676 m)      Patient appears Somnolent, in acute distress patient is intermittently alert, disoriented Atraumatic normocephalic head No cervical or supraclavicular lymphadenopathy appreciated Increased work of breathing work of breathing Tachycardia Abdomen is soft, diffusely tender to palpation Lower extremities are symmetric without appreciable edema Grossly neurologically intact No identifiable skin lesions   Recent Labs  07/06/2016 0918  WBC 5.9  HGB 9.1*  HCT 29.0*    Recent Labs  07/15/2016 0918  NA 141  K 4.1  CL 113*  CO2 18*  GLUCOSE 65  BUN 45*  CREATININE 1.50*  CALCIUM 9.5   No results for input(s): LABPT, INR in the last 72 hours. No results for input(s): LABURIN in the last 72 hours. Results for orders placed or performed during the hospital encounter of 06/01/16  C difficile quick scan w PCR reflex     Status: Abnormal   Collection Time: 06/01/16  9:45 AM  Result Value Ref Range Status  C Diff antigen POSITIVE (A) NEGATIVE Final   C Diff toxin  NEGATIVE NEGATIVE Final   C Diff interpretation Results are indeterminate. See PCR results.  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 In addition, the patient has ascites as well as several ventral hernias.  Imp: The patient is in septic shock with associated acute renal failure secondary to an infected left distal ureteral stone.   Recommendations: The patient is severely hypotensive with pressor requirements and an elevated lactate. Undoubtedly she has bacteremia as well. Initially, I had spoken with interventional radiology to see if a nephrostomy tube was in option. However, she would need to be transferred to [facility] in order to facilitate a nephrostomy tube on the weekends. As such, the second best option here is to take the patient to the OR and place a ureteral stent. I discussed this procedure as well as the associated physiology of urosepsis but the patient and the patient's family. We will proceed emergently to the operating room for the infected left kidney can be drained.  Louis Meckel W

## 2016-07-08 NOTE — H&P (Signed)
PULMONARY / CRITICAL CARE MEDICINE   Name: Suzanne Ewing MRN: [id number] DOB: 03-20-1946    ADMISSION DATE:  07/03/2016 Referring MD - Dr. Clearnce Hasten [facility] ER)  HISTORY: 70 yo young past medical history of recurrent UTI, COPD, chronic disease, coronary artery disease, diabetes, congestive heart failure, recent left-sided lithotripsy for left ureteral calculi and bladder calculi, presents to the ER with hypotension, confusion, and altered mental status. Patient is critically ill and confused, unable to provide a appropriate history, history from chart. Per chart review patient was found small into be hypotensive with a pressure in the 60s over 40s. She is normally awake and alert, able to move around in a motor scooter, both found to be somnolent and minimally responsive and arousable. In route, too large for IVs were started and she was given a 250 mL bolus of fluids with minimal response and pressure. Family gave a history that patient had lithotripsy about 1.5 weeks ago and then started complaining of inability to void completely this week. She had a urinalysis that showed nitrite positive organisms at a previous urology visit, then yesterday had another urinalysis done at urology follow-up, results are still pending.  In the ER she was given 3 L of normal saline, started on norepinephrine 10 g, blood pressure still in the 70s and 80s. CCM was called for admission.  Will review chart shows the patient is a resident at [facility], she is in follow-up with urology on 8/15 noted to have 3+ nitrites and a urinalysis, on 8/17 urology ordered a catheterized urine culture sample, this is yet to be resulted. ED physician placed RIJ CVL (  SIGNIFICANT EVENTS: 8/8> left ureteral calculi, bladder calculi, cystoscopy with uroscopy, lithotripsy and indwelling stent inserted on the left, removal of bladder stone, less than 2.5 cm, lithotripsy including insertion of indwelling urethral stent, crush calculus and  removal of fragments. 8/15> urology follow-up, complaining of voiding issues, UA with 3+ nitrites 8/17> family states patient incompletely able to void, urology ordered catheterized urine culture 8/19>septic shock, possible UTI   Past Medical History:  Diagnosis Date  . Anemia   . Anemia   . Anxiety   . Arthritis   . CAD (coronary artery disease)   . Candidiasis   . CHF (congestive heart failure) ([place])   . Chronic kidney disease   . Chronic pain   . COPD (chronic obstructive pulmonary disease) ([place])   . Coronary artery disease   . Depression   . Diabetes mellitus without complication ([place])   . Edema   . GERD (gastroesophageal reflux disease)   . GERD (gastroesophageal reflux disease)   . Heart disease   . Heel ulcer ([place])   . Hyperkalemia   . Hyperlipidemia   . Hypertension   . Long term current use of antibiotics   . Malnutrition ([place])   . MRSA (methicillin resistant Staphylococcus aureus)   . Myocardial infarction ([place])   . Necrotizing fasciitis ([place])   . Nephrolithiasis   . Osteoporosis   . Sacral pressure ulcer   . Seasonal allergies   . Shortness of breath   . Ulcers of both lower legs ([place])    Non-pressure related  . Upper respiratory infection   . Urinary retention   . UTI (lower urinary tract infection)     Past Surgical History:  Procedure Laterality Date  . BACK SURGERY    . CARPAL TUNNEL RELEASE Bilateral   . CHOLECYSTECTOMY    . CORONARY ARTERY BYPASS GRAFT    .  JOINT REPLACEMENT    . KNEE SURGERY Left   . PERIPHERAL VASCULAR CATHETERIZATION Left 01/03/2016   Procedure: Lower Extremity Angiography;  Surgeon: Algernon Huxley, MD;  Location: [facility];  Service: Cardiovascular;  Laterality: Left;  . PERIPHERAL VASCULAR CATHETERIZATION  01/03/2016   Procedure: Lower Extremity Intervention;  Surgeon: Algernon Huxley, MD;  Location: [facility];  Service: Cardiovascular;;  . SHOULDER SURGERY       Social History   Social History  .  Marital status: Married    Spouse name: N/A  . Number of children: N/A  . Years of education: N/A   Occupational History  . retired    Social History Main Topics  . Smoking status: Former Smoker    Packs/day: 0.50    Years: 20.00    Types: Cigarettes    Quit date: 06/03/2014  . Smokeless tobacco: Never Used  . Alcohol use No  . Drug use: No  . Sexual activity: Not on file   Other Topics Concern  . Not on file   Social History Narrative   Resident of nursing home.      VITAL SIGNS: Temp:  [86.2 F (30.1 C)-100.8 F (38.2 C)] 98.7 F (37.1 C) (08/19 1045) Pulse Rate:  [84-97] 95 (08/19 1045) Resp:  [14-25] 19 (08/19 1045) BP: (69-101)/(28-69) 88/41 (08/19 1045) SpO2:  [60 %-100 %] 100 % (08/19 1045) Weight:  [192 lb (87.1 kg)] 192 lb (87.1 kg) (08/19 0949) HEMODYNAMICS:   VENTILATOR SETTINGS:   INTAKE / OUTPUT:  Intake/Output Summary (Last 24 hours) at 07/04/2016 1108 Last data filed at 06/22/2016 1011  Gross per 24 hour  Intake                0 ml  Output              150 ml  Net             -150 ml    Review of Systems  Unable to perform ROS: Critical illness    Physical Exam  Constitutional: She is well-developed, well-nourished, and in no distress.  HENT:  Head: Normocephalic and atraumatic.  Right Ear: External ear normal.  Left Ear: External ear normal.  Eyes: Conjunctivae and EOM are normal. Pupils are equal, round, and reactive to light.  Neck: Normal range of motion. Neck supple.  Pulmonary/Chest: She is in respiratory distress.  Dec bs bilaterally (R>L), mild resp distress with tachypnea, no use of accessory muscles.  No rales.  Abdominal: Soft. She exhibits no distension and no mass. There is no tenderness. There is no rebound and no guarding.  L sided colostomy bag  Musculoskeletal: Normal range of motion.  Neurological:  Awake, but confused.   Skin: Skin is warm.  clamy Bilateral heel ulcers (R>L) - well healed  Nursing note and vitals  reviewed.    LABS:  CBC  Recent Labs Lab 06/21/2016 0918  WBC 5.9  HGB 9.1*  HCT 29.0*  PLT 143*   Coag's No results for input(s): APTT, INR in the last 168 hours. BMET  Recent Labs Lab 07/03/2016 0918  NA 141  K 4.1  CL 113*  CO2 18*  BUN 45*  CREATININE 1.50*  GLUCOSE 65   Electrolytes  Recent Labs Lab 06/29/2016 0918  CALCIUM 9.5   Sepsis Markers  Recent Labs Lab 07/03/2016 0918  LATICACIDVEN 5.1*   ABG No results for input(s): PHART, PCO2ART, PO2ART in the last 168 hours. Liver Enzymes  Recent Labs Lab 07/10/2016 0918  AST 76*  ALT 46  ALKPHOS 92  BILITOT 0.5  ALBUMIN 2.3*   Cardiac Enzymes  Recent Labs Lab 06/20/2016 0918  TROPONINI 0.06*   Glucose  Recent Labs Lab 06/23/2016 1025  GLUCAP 72    Imaging Dg Chest 1 View  Result Date: 07/06/2016 CLINICAL DATA:  Hypotensive.  Abdominal distension. EXAM: CHEST 1 VIEW COMPARISON:  12/06/2015 FINDINGS: Mildly degraded exam due to AP portable technique and patient body habitus. Prior median sternotomy. Numerous leads and wires project over the chest. Midline trachea. Moderate cardiomegaly. Small to moderate right pleural effusion is increased. possible mild loculation laterally. No pneumothorax. Mild interstitial edema is asymmetric, worse on the right. Lower lobe predominant right-sided airspace disease. IMPRESSION: Cardiomegaly with mild interstitial edema. Increase in moderate right pleural effusion. Possible loculation laterally. Recommend attention on follow-up. Right base atelectasis or infection. Electronically Signed   By: Abigail Miyamoto M.D.   On: 06/25/2016 09:47    STUDIES: CT Renal 8/19>  LINES: RIJ CVL 8/19 (placed by ER, Dr. Kerman Passey  CULTURES: Urine Cx (outside facility, [facility]) 8/17> Urine Cx 8/19> Blood Cx 8/19  ANTIBIOTICS Meropenem 8/19   DISCUSSION: 70 yo F with recent left-sided lithotripsy, history of recurrent UTI, diabetes, CAD, hyperlipidemia, now with  septic shock-possible source UTI  ASSESSMENT / PLAN:  PULMONARY Mild respiratory distress Septic shock Right-sided loculated pleural effusion-chronic Hypertension COPD  P: Septic shock mostly likely due to urinary source, f/u CT renal stone study Maintain O2 sat>88%, supplemental O2 PRN High risk for respiratory failure and intubation pulmicort nebs BID PRN Duonebs Chronic right sided pleural effusion - thoracentesis 11/30/2015, no growth. If sepsis worsens will have to consider re-tapping   CARDIO Hypotension Hx of Hypertension Afib History of coronary artery disease Septic shock Peripheral vascular disease  P: Cont with pressors, MAP>65, wean as tolerated ECHO 11/2015>EF 40-45%, LVH, sys CHF, mild MR, mild LA dilation Hold beta blockers for now given hypotension Hold lasix for now Hold diltiazem    RENAL Hx of Renal calculi status post left-sided lithotripsy Bladder calculi status post lithotripsy Recurrent UTI History of chronic indwelling catheter Acute renal failure Metabolic Acidosis/Lactic Acidosis  P: Monitor BMP, and Cr Avoid nephrotoxic drugs Gentle IVF Follow LA, 5> CT Renal Study 8/19> May need to consult urology pending CT results.   GI GERD S/p L colostomy Bag placement  P: Cont with GI prophylaxis Colostomy care   INFECTIOUS DISEASE Septic shock UTI  P: Trend lactic acid, continue with pressors, tailor antibiotics based on blood and urine cultures Follow-up pro-calcitonin Abx as stated above  ENDO DM  P: ICU hypo/hyperglycemia protocol SSI  NEURO AMS Metabolic Encephalopathy  A: Related to underlying infection Avoid sedative drugs if possible Cont to monitor neuro status.    Thank you for consulting [facility], Please feel free to contacts [place] with any questions at [phone] (please enter 7-digits).  I have personally obtained a history, examined the patient, evaluated laboratory and  imaging results, formulated the assessment and plan and placed orders.  The Patient requires high complexity decision making for assessment and support, frequent evaluation and titration of therapies, application of advanced monitoring technologies and extensive interpretation of multiple databases. Critical Care Time devoted to patient care services described in this note is 55 minutes.   Overall, patient is critically ill, prognosis is guarded. Patient at high risk for cardiac arrest and death.    Vilinda Boehringer, MD [facility] Pager [phone] (please enter 7-digits) On Call Pager - [phone] (please enter 7-digits)  Note: This note was prepared with Dragon dictation along with smaller phrase technology. Any transcriptional errors that result from this process are unintentional.  Repeat sepsis assessment completed.

## 2016-07-08 NOTE — Progress Notes (Signed)
S: Persistent hypotension with MAPs in the 50s despite pressors and fluid boluses. Repeat ABG as follows:pH 7.15, PCO2 43, PO2 59, HCO3 15, O2 sat 80%.  Exam:  Blood pressure (!) 80/51, pulse (!) 114, temperature 97.5 F (36.4 C), temperature source Other (Comment), resp. rate 13, height 5\' 6"  (1.676 m), weight 198 lb 13.7 oz (90.2 kg), SpO2 100 %. Gen: acutely ill-looking Respiratory: Intubated, bilateral breath sounds, diminished in the bases CV: Tachycardic, S1/S2, no MRG Extremities: cyanotic, faint pulses  Assessment Severe respiratory and metabolic acidosis Septic shock-severe; necessitating pressors and refractory to IV fluids Infection/obstructed left distal ureteral stone s/p ureteral stent placement Severe left hydronephrosis Oliguria  UTI  Plan -STAT ABG reviewed; vent changes made -IV fluid boluses -Levophed and vasopressin gtt titrate to keep MAP 65 or above -Dr. Louis Meckel notified of oliguria and need for pressures; no new orders -KUB -Bicarb gtt -Repeat ABG AT 23:30 -rest of the treatment plan unchanged  Suzanne Ewing S. [facility] ANP-BC Pulmonary and Critical Care Medicine [facility] Pager [phone] or [phone]

## 2016-07-08 NOTE — Op Note (Signed)
Preoperative diagnosis:  1. Septic shock   Postoperative diagnosis:  1. Same 2. Left obstructive ureteral stone   Procedure:  1. Cystoscopy 2. left ureteral stent placement 3. left retrograde pyelography with interpretation   Surgeon: Ardis Hughs, MD  Anesthesia: General  Complications: None  Intraoperative findings:  left retrograde pyelography demonstrated a filling defect within the left distal ureter consistent with the patient's known calculus.  Severe proximal hydronephrosis with medial deviation of the ureter  Drain: 26 cm times 6 French double-J ureteral stent was placed in the left ureter  EBL: Minimal  Specimens: Left renal pelvic urine culture  Indication: Suzanne Ewing is a 70 y.o. patient with septic shock secondary to left distal obstructing stone. After reviewing the management options for treatment, he elected to proceed with the above surgical procedure(s). We have discussed the potential benefits and risks of the procedure, side effects of the proposed treatment, the likelihood of the patient achieving the goals of the procedure, and any potential problems that might occur during the procedure or recuperation. Informed consent has been obtained.  Description of procedure:  The patient was taken to the operating room and general anesthesia was induced.  The patient was placed in the dorsal lithotomy position, prepped and draped in the usual sterile fashion, and preoperative antibiotics were administered. A preoperative time-out was performed.   Cystourethroscopy was performed.  The patient's urethra was examined and was normal. The bladder was then systematically examined in its entirety. There was no evidence for any bladder tumors, stones, or other mucosal pathology.    Attention then turned to the leftureteral orifice and a ureteral catheter was used to intubate the ureteral orifice. 20 mL of turbid urine was aspirated and sent for culture. Omnipaque  contrast was injected through the ureteral catheter and a retrograde pyelogram was performed with findings as dictated above.  A 0.38 sensor guidewire was then advanced up the left ureter into the renal pelvis under fluoroscopic guidance.  The wire was then backloaded through the cystoscope and a ureteral stent was advance over the wire using Seldinger technique.  The stent was positioned appropriately under fluoroscopic and cystoscopic guidance.  The wire was then removed with an adequate stent curl noted in the renal pelvis as well as in the bladder.  The bladder was then emptied and the procedure ended.  The patient appeared to tolerate the procedure well and without complications.  The patient was able to be awakened and transferred to the recovery unit in satisfactory condition.    Ardis Hughs, M.D.

## 2016-07-08 NOTE — Progress Notes (Signed)
[facility] Progress Note Patient Name: Suzanne Ewing DOB: 08-Oct-1946 MRN: [id number]   Date of Service  07/14/2016  HPI/Events of Note  ABG 7.1/49/62/80% Sats 98% on pleth with good wave form.  eICU Interventions  Start bicarb drip Increase RR on vent from 15 to 24.     Intervention Category Evaluation Type: Other  Yahira Timberman 06/26/2016, 5:29 PM

## 2016-07-08 NOTE — Anesthesia Preprocedure Evaluation (Signed)
Anesthesia Evaluation  Patient identified by MRN, date of birth, ID band Patient awake    Reviewed: Allergy & Precautions, NPO status , Patient's Chart, lab work & pertinent test results, reviewed documented beta blocker date and time   Airway Mallampati: III  TM Distance: >3 FB     Dental  (+) Chipped   Pulmonary shortness of breath, COPD,  COPD inhaler, former smoker,           Cardiovascular hypertension, Pt. on medications and Pt. on home beta blockers + CAD, + Past MI, + Peripheral Vascular Disease and +CHF       Neuro/Psych PSYCHIATRIC DISORDERS Anxiety Depression    GI/Hepatic PUD, GERD  Controlled,  Endo/Other  diabetes, Type 2  Renal/GU Renal disease     Musculoskeletal   Abdominal   Peds  Hematology  (+) anemia ,   Anesthesia Other Findings CABG. Increased trops. R pleural eff, ascites. Hb 9.1.  Reproductive/Obstetrics                             Anesthesia Physical Anesthesia Plan  ASA: IV  Anesthesia Plan: General   Post-op Pain Management:    Induction: Intravenous and Rapid sequence  Airway Management Planned: Oral ETT  Additional Equipment:   Intra-op Plan:   Post-operative Plan:   Informed Consent: I have reviewed the patients History and Physical, chart, labs and discussed the procedure including the risks, benefits and alternatives for the proposed anesthesia with the patient or authorized representative who has indicated his/her understanding and acceptance.     Plan Discussed with: CRNA  Anesthesia Plan Comments:         Anesthesia Quick Evaluation

## 2016-07-08 NOTE — Anesthesia Postprocedure Evaluation (Signed)
Anesthesia Post Note  Patient: Suzanne Ewing  Procedure(s) Performed: Procedure(s) (LRB): CYSTOSCOPY WITH RETROGRADE PYELOGRAM, STENT PLACEMENT (Left)  Patient location during evaluation: PACU Anesthesia Type: General Level of consciousness: awake and alert Pain management: pain level controlled Vital Signs Assessment: post-procedure vital signs reviewed and stable Respiratory status: spontaneous breathing, nonlabored ventilation, respiratory function stable and patient connected to nasal cannula oxygen Cardiovascular status: blood pressure returned to baseline and stable Postop Assessment: no signs of nausea or vomiting Anesthetic complications: no    Last Vitals:  Vitals:   06/29/2016 1400 07/02/2016 1531  BP: 109/78 (!) 92/53  Pulse: (!) 104 99  Resp: 19 (!) 22  Temp:  36.4 C    Last Pain:  Vitals:   07/18/2016 1531  TempSrc: Other (Comment)                 Hyman Crossan S

## 2016-07-08 NOTE — ED Notes (Signed)
Dr. Kerman Passey at bedside attempting to insert central line

## 2016-07-08 NOTE — Progress Notes (Addendum)
Update; S: status post left ureteral stent placement, came back to ICU intubated.   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 Louis Meckel (Urology) Op note: "The patient is status post left ureteral stent placement for an infected/obstructed left distal ureteral stone. Intraoperatively the patient was noted to have severe hydronephrosis to the level of the distal ureter. Behind the obstructing stone was turbid and thick urine which I sent for culture. A 26 cm x 6 French double-J ureteral stent was placed."  - cont with current abx - cont with mechanical ventilator, PRN Fentanyl/Versed for analgesia/sedation for now, if requiring frequent pushes may start fentanyl gtt.  - wean ventilator as tolerated - SAT/SBT daily - abg, cxr - cont with IVF - currently on levo, may need to add vasopressin if levo requirement continues to increase - 1L NS bolus now - f/u Ur Cx from surgery - updated Husband and Daughter  Off note: patient had a colostomy  placed last year August at [facility] after having a large gluteal ulcer with exposed bowel. 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    Repeat sepsis assessment completed.    Critical Care time - 35 mins  Vilinda Boehringer, MD [facility] Pager [phone] (please enter 7-digits) On Call Pager - [phone] (please enter 7-digits)

## 2016-07-08 NOTE — Progress Notes (Signed)
The patient is status post left ureteral stent placement for an infected/obstructed left distal ureteral stone. Intraoperatively the patient was noted to have severe hydronephrosis to the level of the distal ureter. Behind the obstructing stone was turbid and thick urine which I sent for culture. A 26 cm x 6 French double-J ureteral stent was placed. The patient should be placed on culture specific antibiotics and followed up in clinic. I requested follow-up at [facility] in 3 weeks, however she is a long-time patient of Dr. Dene Gentry in [place]. The patient will cancel her appointment if she chooses to follow up [facility]. Please consult urology when necessary.

## 2016-07-08 NOTE — Progress Notes (Signed)
Pharmacy Antibiotic Note  Suzanne Ewing is a 70 y.o. female admitted on 06/30/2016 with sepsis.  Pharmacy has been consulted for meropenem dosing. Patient has recent history, July 2017, of ESBL UTI and has tolerated carbapenems in the past.    Plan: Meropenem 1g IV Q12hr.  Height: 5\' 5"  (165.1 cm) Weight: 192 lb (87.1 kg) IBW/kg (Calculated) : 57  Temp (24hrs), Avg:96.5 F (35.8 C), Min:86.2 F (30.1 C), Max:100.8 F (38.2 C)   Recent Labs Lab 06/27/2016 0918  WBC 5.9  CREATININE 1.50*  LATICACIDVEN 5.1*    Estimated Creatinine Clearance: 38 mL/min (by C-G formula based on SCr of 1.5 mg/dL).    Allergies  Allergen Reactions  . Macrobid [Nitrofurantoin Monohyd Macro] Itching, Rash and Swelling    PT HOSPITALIZED  . Penicillins Shortness Of Breath, Rash and Other (See Comments)    Has patient had a PCN reaction causing immediate rash, facial/tongue/throat swelling, SOB or lightheadedness with hypotension: Yes Has patient had a PCN reaction causing severe rash involving mucus membranes or skin necrosis: No Has patient had a PCN reaction that required hospitalization Yes Has patient had a PCN reaction occurring within the last 10 years: No If all of the above answers are "NO", then may proceed with Cephalosporin use.   . Tape Rash  . Azithromycin Other (See Comments)    Reaction: unknown   . Clarithromycin Other (See Comments)    Abdominal pain  . Nitroglycerin Other (See Comments)    Reaction - unknown  . Leflunomide Rash  . Morphine Nausea And Vomiting    Nausea and vomiting  . Naproxen Rash    Antimicrobials this admission: Levofloxacin 8/19 >> 8/19 Vancomycin 8/19 >> 8/19 Meropenem 8/19 >>  Dose adjustments this admission: Aztreonam ordered and discontinued by provider prior to administration.   Microbiology results: 8/19 BCx: pending  8/19 UCx: pending  8/19 MRSA PCR: pending   Pharmacy will continue to monitor and adjust per consult.     Susan Arana L 06/22/2016 11:51 AM

## 2016-07-08 NOTE — ED Provider Notes (Signed)
-----------------------------------------   11:06 AM on 06/24/2016 -----------------------------------------  Patient blood pressure improving on pressors. Decision was made to place a central line for more safe administration of medications. I discussed the risk and benefits, obtained verbal consent from the patient as well as her daughter who is the power of attorney. Right internal jugular central line placed.  CENTRAL LINE Performed by: Harvest Dark Consent: The procedure was performed in an emergent situation. Required items: required blood products, implants, devices, and special equipment available Patient identity confirmed: arm band and provided demographic data Time out: Immediately prior to procedure a "time out" was called to verify the correct patient, procedure, equipment, support staff and site/side marked as required. Indications: vascular access Anesthesia: local infiltration Local anesthetic: lidocaine 1% with epinephrine Anesthetic total: 3 ml Patient sedated: no Preparation: skin prepped with 2% chlorhexidine Skin prep agent dried: skin prep agent completely dried prior to procedure Sterile barriers: all five maximum sterile barriers used - cap, mask, sterile gown, sterile gloves, and large sterile sheet Hand hygiene: hand hygiene performed prior to central venous catheter insertion  Location details: Right internal jugular   Catheter type: triple lumen Catheter size: 8 Fr Pre-procedure: landmarks identified Ultrasound guidance: Yes  Successful placement: yes Post-procedure: line sutured and dressing applied Assessment: blood return through all parts, free fluid flow, placement verified by x-ray and no pneumothorax on x-ray Patient tolerance: Patient tolerated the procedure well with no immediate complications.    Harvest Dark, MD 06/21/2016 [phone]

## 2016-07-08 NOTE — Progress Notes (Signed)
Called Elink with post intubation ABG, pH 7.1. Order to increase rate from 15 to 24 and start bicarb drip per Dr. Vaughan Browner. Called RT for rate change. Will continue to assess.

## 2016-07-08 NOTE — Transfer of Care (Signed)
Immediate Anesthesia Transfer of Care Note  Patient: Suzanne Ewing  Procedure(s) Performed: Procedure(s): CYSTOSCOPY WITH RETROGRADE PYELOGRAM, STENT PLACEMENT (Left)  Patient Location: PACU and ICU  Anesthesia Type:General  Level of Consciousness: sedated  Airway & Oxygen Therapy: Patient Spontanous Breathing and Patient remains intubated per anesthesia plan pulmonologist assuming vent management  Post-op Assessment: Report given to RN and Post -op Vital signs reviewed and stable  Post vital signs: Reviewed and stable  Last Vitals:  Vitals:   06/25/2016 1400 07/20/2016 1531  BP: 109/78 (!) 92/53  Pulse: (!) 104 99  Resp: 19 (!) 22  Temp:  36.4 C    Last Pain:  Vitals:   07/02/2016 1531  TempSrc: Other (Comment)         Complications: No apparent anesthesia complications

## 2016-07-09 ENCOUNTER — Inpatient Hospital Stay: Payer: Medicare Other

## 2016-07-09 DIAGNOSIS — R34 Anuria and oliguria: Secondary | ICD-10-CM

## 2016-07-09 DIAGNOSIS — R7881 Bacteremia: Secondary | ICD-10-CM

## 2016-07-09 LAB — RENAL FUNCTION PANEL
ALBUMIN: 1.8 g/dL — AB (ref 3.5–5.0)
ALBUMIN: 1.9 g/dL — AB (ref 3.5–5.0)
ANION GAP: 9 (ref 5–15)
ANION GAP: 8 (ref 5–15)
Albumin: 1.9 g/dL — ABNORMAL LOW (ref 3.5–5.0)
Anion gap: 11 (ref 5–15)
BUN: 48 mg/dL — AB (ref 6–20)
BUN: 40 mg/dL — ABNORMAL HIGH (ref 6–20)
BUN: 34 mg/dL — AB (ref 6–20)
CHLORIDE: 105 mmol/L (ref 101–111)
CHLORIDE: 105 mmol/L (ref 101–111)
CO2: 22 mmol/L (ref 22–32)
CO2: 24 mmol/L (ref 22–32)
CO2: 25 mmol/L (ref 22–32)
Calcium: 7.5 mg/dL — ABNORMAL LOW (ref 8.9–10.3)
Calcium: 7.6 mg/dL — ABNORMAL LOW (ref 8.9–10.3)
Calcium: 7.5 mg/dL — ABNORMAL LOW (ref 8.9–10.3)
Chloride: 104 mmol/L (ref 101–111)
Creatinine, Ser: 1.73 mg/dL — ABNORMAL HIGH (ref 0.44–1.00)
Creatinine, Ser: 1.42 mg/dL — ABNORMAL HIGH (ref 0.44–1.00)
Creatinine, Ser: 1.31 mg/dL — ABNORMAL HIGH (ref 0.44–1.00)
GFR calc Af Amer: 33 mL/min — ABNORMAL LOW (ref >60)
GFR calc Af Amer: 47 mL/min — ABNORMAL LOW (ref >60)
GFR calc non Af Amer: 29 mL/min — ABNORMAL LOW (ref >60)
GFR calc non Af Amer: 36 mL/min — ABNORMAL LOW (ref >60)
GFR, EST AFRICAN AMERICAN: 42 mL/min — AB (ref >60)
GFR, EST NON AFRICAN AMERICAN: 40 mL/min — AB (ref >60)
GLUCOSE: 129 mg/dL — AB (ref 65–99)
Glucose, Bld: 129 mg/dL — ABNORMAL HIGH (ref 65–99)
Glucose, Bld: 108 mg/dL — ABNORMAL HIGH (ref 65–99)
PHOSPHORUS: 2.8 mg/dL (ref 2.5–4.6)
POTASSIUM: 4.3 mmol/L (ref 3.5–5.1)
POTASSIUM: 4.3 mmol/L (ref 3.5–5.1)
Phosphorus: 3.9 mg/dL (ref 2.5–4.6)
Phosphorus: 3.3 mg/dL (ref 2.5–4.6)
Potassium: 4.4 mmol/L (ref 3.5–5.1)
Sodium: 138 mmol/L (ref 135–145)
Sodium: 138 mmol/L (ref 135–145)
Sodium: 137 mmol/L (ref 135–145)

## 2016-07-09 LAB — GLUCOSE, CAPILLARY
GLUCOSE-CAPILLARY: 121 mg/dL — AB (ref 65–99)
GLUCOSE-CAPILLARY: 109 mg/dL — AB (ref 65–99)
Glucose-Capillary: 101 mg/dL — ABNORMAL HIGH (ref 65–99)
Glucose-Capillary: 124 mg/dL — ABNORMAL HIGH (ref 65–99)
Glucose-Capillary: 144 mg/dL — ABNORMAL HIGH (ref 65–99)
Glucose-Capillary: 60 mg/dL — ABNORMAL LOW (ref 65–99)
Glucose-Capillary: 83 mg/dL (ref 65–99)
Glucose-Capillary: 90 mg/dL (ref 65–99)

## 2016-07-09 LAB — LACTIC ACID, PLASMA
LACTIC ACID, VENOUS: 1.7 mmol/L (ref 0.5–1.9)
Lactic Acid, Venous: 3 mmol/L (ref 0.5–1.9)
Lactic Acid, Venous: 2.7 mmol/L (ref 0.5–1.9)

## 2016-07-09 LAB — PHOSPHORUS: PHOSPHORUS: 3.3 mg/dL (ref 2.5–4.6)

## 2016-07-09 LAB — BLOOD GAS, ARTERIAL
ACID-BASE DEFICIT: 7.9 mmol/L — AB (ref 0.0–2.0)
ACID-BASE DEFICIT: 6.9 mmol/L — AB (ref 0.0–2.0)
Allens test (pass/fail): POSITIVE — AB
BICARBONATE: 18.9 meq/L — AB (ref 21.0–28.0)
BICARBONATE: 20.1 meq/L — AB (ref 21.0–28.0)
FIO2: 0.5
FIO2: 0.5
LHR: 30 {breaths}/min
MECHANICAL RATE: 30
MECHVT: 400 mL
O2 SAT: 94.9 %
O2 Saturation: 90.3 %
PATIENT TEMPERATURE: 37
PCO2 ART: 43 mmHg (ref 32.0–48.0)
PCO2 ART: 47 mmHg (ref 32.0–48.0)
PEEP/CPAP: 5 cmH2O
PH ART: 7.25 — AB (ref 7.350–7.450)
PO2 ART: 87 mmHg (ref 83.0–108.0)
Patient temperature: 37
RATE: 30 resp/min
VT: 400 mL
pH, Arterial: 7.24 — ABNORMAL LOW (ref 7.350–7.450)
pO2, Arterial: 70 mmHg — ABNORMAL LOW (ref 83.0–108.0)

## 2016-07-09 LAB — CBC
HEMATOCRIT: 25.2 % — AB (ref 35.0–47.0)
HEMOGLOBIN: 7.9 g/dL — AB (ref 12.0–16.0)
MCH: 24.6 pg — ABNORMAL LOW (ref 26.0–34.0)
MCHC: 31.4 g/dL — ABNORMAL LOW (ref 32.0–36.0)
MCV: 78.3 fL — AB (ref 80.0–100.0)
Platelets: 104 10*3/uL — ABNORMAL LOW (ref 150–440)
RBC: 3.22 MIL/uL — AB (ref 3.80–5.20)
RDW: 18.2 % — ABNORMAL HIGH (ref 11.5–14.5)
WBC: 16.1 10*3/uL — AB (ref 3.6–11.0)

## 2016-07-09 LAB — BLOOD CULTURE ID PANEL (REFLEXED)
ACINETOBACTER BAUMANNII: NOT DETECTED
ACINETOBACTER BAUMANNII: NOT DETECTED
CANDIDA GLABRATA: NOT DETECTED
CANDIDA KRUSEI: NOT DETECTED
CANDIDA PARAPSILOSIS: NOT DETECTED
CANDIDA PARAPSILOSIS: NOT DETECTED
CARBAPENEM RESISTANCE: NOT DETECTED
CARBAPENEM RESISTANCE: NOT DETECTED
Candida albicans: NOT DETECTED
Candida albicans: NOT DETECTED
Candida glabrata: NOT DETECTED
Candida krusei: NOT DETECTED
Candida tropicalis: NOT DETECTED
Candida tropicalis: NOT DETECTED
ENTEROBACTERIACEAE SPECIES: NOT DETECTED
ENTEROCOCCUS SPECIES: NOT DETECTED
ESCHERICHIA COLI: DETECTED — AB
Enterobacter cloacae complex: NOT DETECTED
Enterobacter cloacae complex: NOT DETECTED
Enterobacteriaceae species: NOT DETECTED
Enterococcus species: NOT DETECTED
Escherichia coli: NOT DETECTED
Haemophilus influenzae: NOT DETECTED
Haemophilus influenzae: NOT DETECTED
KLEBSIELLA OXYTOCA: NOT DETECTED
KLEBSIELLA PNEUMONIAE: NOT DETECTED
Klebsiella oxytoca: NOT DETECTED
Klebsiella pneumoniae: NOT DETECTED
LISTERIA MONOCYTOGENES: NOT DETECTED
Listeria monocytogenes: NOT DETECTED
Methicillin resistance: NOT DETECTED
Methicillin resistance: NOT DETECTED
NEISSERIA MENINGITIDIS: NOT DETECTED
NEISSERIA MENINGITIDIS: NOT DETECTED
PSEUDOMONAS AERUGINOSA: NOT DETECTED
Proteus species: NOT DETECTED
Proteus species: NOT DETECTED
Pseudomonas aeruginosa: NOT DETECTED
SERRATIA MARCESCENS: NOT DETECTED
STAPHYLOCOCCUS AUREUS BCID: NOT DETECTED
STAPHYLOCOCCUS SPECIES: DETECTED — AB
STAPHYLOCOCCUS SPECIES: NOT DETECTED
STREPTOCOCCUS AGALACTIAE: NOT DETECTED
STREPTOCOCCUS AGALACTIAE: NOT DETECTED
STREPTOCOCCUS PYOGENES: NOT DETECTED
STREPTOCOCCUS SPECIES: DETECTED — AB
STREPTOCOCCUS SPECIES: NOT DETECTED
Serratia marcescens: NOT DETECTED
Staphylococcus aureus (BCID): NOT DETECTED
Streptococcus pneumoniae: NOT DETECTED
Streptococcus pneumoniae: NOT DETECTED
Streptococcus pyogenes: NOT DETECTED
VANCOMYCIN RESISTANCE: NOT DETECTED
Vancomycin resistance: NOT DETECTED

## 2016-07-09 LAB — BASIC METABOLIC PANEL
Anion gap: 10 (ref 5–15)
BUN: 43 mg/dL — AB (ref 6–20)
CHLORIDE: 108 mmol/L (ref 101–111)
CO2: 20 mmol/L — ABNORMAL LOW (ref 22–32)
Calcium: 7.8 mg/dL — ABNORMAL LOW (ref 8.9–10.3)
Creatinine, Ser: 1.73 mg/dL — ABNORMAL HIGH (ref 0.44–1.00)
GFR, EST AFRICAN AMERICAN: 33 mL/min — AB (ref >60)
GFR, EST NON AFRICAN AMERICAN: 29 mL/min — AB (ref >60)
Glucose, Bld: 97 mg/dL (ref 65–99)
POTASSIUM: 4.3 mmol/L (ref 3.5–5.1)
SODIUM: 138 mmol/L (ref 135–145)

## 2016-07-09 LAB — MAGNESIUM: Magnesium: 1.7 mg/dL (ref 1.7–2.4)

## 2016-07-09 LAB — PROCALCITONIN: PROCALCITONIN: 81.46 ng/mL

## 2016-07-09 MED ORDER — VANCOMYCIN HCL 10 G IV SOLR
1250.0000 mg | Freq: Once | INTRAVENOUS | Status: AC
  Administered 2016-07-09: 1250 mg via INTRAVENOUS
  Filled 2016-07-09: qty 1250

## 2016-07-09 MED ORDER — MAGNESIUM SULFATE 2 GM/50ML IV SOLN
2.0000 g | Freq: Once | INTRAVENOUS | Status: AC
  Administered 2016-07-09: 2 g via INTRAVENOUS
  Filled 2016-07-09: qty 50

## 2016-07-09 MED ORDER — DEXTROSE 50 % IV SOLN
INTRAVENOUS | Status: AC
  Administered 2016-07-09: 25 mL via INTRAVENOUS
  Filled 2016-07-09: qty 50

## 2016-07-09 MED ORDER — DEXTROSE 50 % IV SOLN
25.0000 mL | Freq: Once | INTRAVENOUS | Status: AC
  Administered 2016-07-09: 25 mL via INTRAVENOUS

## 2016-07-09 MED ORDER — PUREFLOW DIALYSIS SOLUTION
INTRAVENOUS | Status: DC
  Administered 2016-07-09 (×2): via INTRAVENOUS_CENTRAL
  Administered 2016-07-10 – 2016-07-11 (×4): 3 via INTRAVENOUS_CENTRAL

## 2016-07-09 MED ORDER — HEPARIN SODIUM (PORCINE) 5000 UNIT/ML IJ SOLN
5000.0000 [IU] | Freq: Three times a day (TID) | INTRAMUSCULAR | Status: DC
  Administered 2016-07-09: 5000 [IU] via SUBCUTANEOUS
  Filled 2016-07-09 (×2): qty 1

## 2016-07-09 MED ORDER — HEPARIN SODIUM (PORCINE) 5000 UNIT/ML IJ SOLN
5000.0000 [IU] | Freq: Three times a day (TID) | INTRAMUSCULAR | Status: DC
  Administered 2016-07-09 – 2016-07-11 (×6): 5000 [IU] via SUBCUTANEOUS
  Filled 2016-07-09 (×5): qty 1

## 2016-07-09 MED ORDER — ANTISEPTIC ORAL RINSE SOLUTION (CORINZ)
7.0000 mL | OROMUCOSAL | Status: DC
  Administered 2016-07-09 – 2016-07-11 (×26): 7 mL via OROMUCOSAL
  Filled 2016-07-09 (×31): qty 7

## 2016-07-09 MED ORDER — IPRATROPIUM-ALBUTEROL 0.5-2.5 (3) MG/3ML IN SOLN: 3.0000 mL | Freq: Four times a day (QID) | RESPIRATORY_TRACT | Status: DC | PRN

## 2016-07-09 MED ORDER — ACETAMINOPHEN 650 MG RE SUPP
650.0000 mg | RECTAL | Status: DC | PRN
  Administered 2016-07-09: 650 mg via RECTAL
  Filled 2016-07-09 (×2): qty 1

## 2016-07-09 MED ORDER — VANCOMYCIN HCL 10 G IV SOLR
1250.0000 mg | INTRAVENOUS | Status: DC
  Administered 2016-07-09: 1250 mg via INTRAVENOUS
  Filled 2016-07-09: qty 1250

## 2016-07-09 MED ORDER — VECURONIUM BROMIDE 10 MG IV SOLR
10.0000 mg | Freq: Once | INTRAVENOUS | Status: AC
  Administered 2016-07-09: 10 mg via INTRAVENOUS

## 2016-07-09 MED ORDER — HEPARIN SODIUM (PORCINE) 1000 UNIT/ML DIALYSIS: 1000.0000 [IU] | INTRAMUSCULAR | Status: DC | PRN

## 2016-07-09 MED ORDER — VECURONIUM BROMIDE 10 MG IV SOLR
INTRAVENOUS | Status: AC
Start: 2016-07-09 — End: 2016-07-09
  Filled 2016-07-09: qty 10

## 2016-07-09 MED ORDER — MIDAZOLAM HCL 2 MG/2ML IJ SOLN
2.0000 mg | Freq: Once | INTRAMUSCULAR | Status: AC
  Administered 2016-07-09: 2 mg via INTRAVENOUS

## 2016-07-09 MED ORDER — SODIUM CHLORIDE 0.9 % IV SOLN
1.0000 g | Freq: Three times a day (TID) | INTRAVENOUS | Status: DC
  Administered 2016-07-09 – 2016-07-11 (×5): 1 g via INTRAVENOUS
  Filled 2016-07-09 (×7): qty 1

## 2016-07-09 MED ORDER — BUDESONIDE 0.5 MG/2ML IN SUSP
0.5000 mg | Freq: Two times a day (BID) | RESPIRATORY_TRACT | Status: DC
  Administered 2016-07-09 – 2016-07-11 (×5): 0.5 mg via RESPIRATORY_TRACT
  Filled 2016-07-09 (×5): qty 2

## 2016-07-09 MED ORDER — VANCOMYCIN HCL 10 G IV SOLR
1250.0000 mg | INTRAVENOUS | Status: DC
  Administered 2016-07-10: 1250 mg via INTRAVENOUS
  Filled 2016-07-09: qty 1250

## 2016-07-09 MED ORDER — HYDROCORTISONE NA SUCCINATE PF 100 MG IJ SOLR
50.0000 mg | Freq: Four times a day (QID) | INTRAMUSCULAR | Status: AC
  Administered 2016-07-09 – 2016-07-10 (×6): 50 mg via INTRAVENOUS
  Filled 2016-07-09 (×6): qty 2

## 2016-07-09 NOTE — Progress Notes (Signed)
Pharmacy Antibiotic Note  Eugina Kaercher Sillers is a 70 y.o. female admitted on 07/10/2016 with sepsis.  Pharmacy has been consulted for meropenem and vancomycin dosing. Patient has recent history, July 2017, of ESBL UTI and has tolerated carbapenems in the past. Patient now requiring CRRT and mechanical ventilation.    Plan: Meropenem 1g IV Q8hr.    Vancomycin 1250mg  IV Q24hr. Will obtain trough level with second day of CRRT.   Height: 5\' 6"  (167.6 cm) Weight: 207 lb 3.7 oz (94 kg) IBW/kg (Calculated) : 59.3  Temp (24hrs), Avg:100 F (37.8 C), Min:97.5 F (36.4 C), Max:102 F (38.9 C)   Recent Labs Lab 07/20/2016 0918 06/21/2016 1408 07/18/2016 1822 07/09/16 0430 07/09/16 0904 07/09/16 1244  WBC 5.9  --  19.9* 16.1*  --   --   CREATININE 1.50*  --  1.53* 1.73*  --  1.73*  LATICACIDVEN 5.1* 4.4*  --  3.0* 2.7*  --     Estimated Creatinine Clearance: 35 mL/min (by C-G formula based on SCr of 1.73 mg/dL).    Allergies  Allergen Reactions  . Macrobid [Nitrofurantoin Monohyd Macro] Itching, Rash and Swelling    PT HOSPITALIZED  . Penicillins Shortness Of Breath, Rash and Other (See Comments)    Has patient had a PCN reaction causing immediate rash, facial/tongue/throat swelling, SOB or lightheadedness with hypotension: Yes Has patient had a PCN reaction causing severe rash involving mucus membranes or skin necrosis: No Has patient had a PCN reaction that required hospitalization Yes Has patient had a PCN reaction occurring within the last 10 years: No If all of the above answers are "NO", then may proceed with Cephalosporin use.   . Tape Rash  . Azithromycin Other (See Comments)    Reaction: unknown   . Clarithromycin Other (See Comments)    Abdominal pain  . Nitroglycerin Other (See Comments)    Reaction - unknown  . Leflunomide Rash  . Morphine Nausea And Vomiting    Nausea and vomiting  . Naproxen Rash    Antimicrobials this admission: Levofloxacin 8/19 >> 8/19 Vancomycin  8/19 >>  Meropenem 8/19 >>   Dose adjustments this admission: Aztreonam ordered and discontinued by provider prior to administration.   Microbiology results: 8/19 BCx: Ecoli/Staph 8/19 UCx: pending  8/19 MRSA PCR: negative  Pharmacy will continue to monitor and adjust per consult.     Abbigayle Toole L 07/09/2016 3:05 PM

## 2016-07-09 NOTE — Op Note (Signed)
  OPERATIVE NOTE   PROCEDURE: 1. Ultrasound guidance for vascular access left femoral vein 2. Placement of a 30 cm Duoglide dialysis catheter left femoral vein  PRE-OPERATIVE DIAGNOSIS: 1. Acute renal failure 2. Sepsis, MSOF  POST-OPERATIVE DIAGNOSIS: Same  SURGEON: Leotis Pain, MD  ASSISTANT(S): None  ANESTHESIA: local  ESTIMATED BLOOD LOSS: Minimal   FINDING(S): 1. None  SPECIMEN(S): None  INDICATIONS:  Patient is a 70 y.o.female who presents with  sepsis and multisystem organ failure. She has severe acidosis and renal failure and the nephrology service has decided to initiate CRRT. The critical care service attempted to place a dialysis catheter without success and we're consult and for placement.  Risks and benefits were discussed with the family, and informed consent was obtained..  DESCRIPTION: After obtaining full informed written consent, the patient was laid flat in the bed.She already had an existing right IJ triple-lumen catheter and left jugular catheters do not tend to flow well for CRRT. The left groin was sterilely prepped and draped in a sterile surgical field was created. The left femoral vein was visualized with ultrasound and found to be widely patent. It was then accessed under direct guidance without difficulty with a Seldinger needle and a permanent image was recorded. A J-wire was then placed. After skin nick and dilatation, a 30 cm Duoglide dialysis catheter was placed over the wire and the wire was removed. The lumens withdrew dark red nonpulsatile blood and flushed easily with sterile saline. The catheter was secured to the skin with 3 nylon sutures. Sterile dressing was placed.  COMPLICATIONS: None  CONDITION: Stable  Suzanne Ewing 07/09/2016 12:49 PM

## 2016-07-09 NOTE — Progress Notes (Signed)
ANTIBIOTIC CONSULT NOTE - INITIAL  Pharmacy Consult for Vancomycin  Indication: sepsis  Allergies  Allergen Reactions  . Macrobid [Nitrofurantoin Monohyd Macro] Itching, Rash and Swelling    PT HOSPITALIZED  . Penicillins Shortness Of Breath, Rash and Other (See Comments)    Has patient had a PCN reaction causing immediate rash, facial/tongue/throat swelling, SOB or lightheadedness with hypotension: Yes Has patient had a PCN reaction causing severe rash involving mucus membranes or skin necrosis: No Has patient had a PCN reaction that required hospitalization Yes Has patient had a PCN reaction occurring within the last 10 years: No If all of the above answers are "NO", then may proceed with Cephalosporin use.   . Tape Rash  . Azithromycin Other (See Comments)    Reaction: unknown   . Clarithromycin Other (See Comments)    Abdominal pain  . Nitroglycerin Other (See Comments)    Reaction - unknown  . Leflunomide Rash  . Morphine Nausea And Vomiting    Nausea and vomiting  . Naproxen Rash    Patient Measurements: Height: 5\' 6"  (167.6 cm) Weight: 207 lb 3.7 oz (94 kg) IBW/kg (Calculated) : 59.3 Adjusted Body Weight: 73.2 kg   Vital Signs: Temp: 99.5 F (37.5 C) (08/20 0600) Temp Source: Core (Comment) (08/19 2300) BP: 128/64 (08/20 0600) Pulse Rate: 119 (08/20 0600) Intake/Output from previous day: 08/19 0701 - 08/20 0700 In: 6964.5 [I.V.:6764.5; IV Piggyback:200] Out: 225 [Urine:225] Intake/Output from this shift: Total I/O In: 5737.1 [I.V.:5537.1; IV Piggyback:200] Out: -   Labs:  Recent Labs  07/01/2016 0918 07/19/2016 1822 07/09/16 0430  WBC 5.9 19.9* 16.1*  HGB 9.1* 8.0* 7.9*  PLT 143* 138* 104*  CREATININE 1.50* 1.53* 1.73*   Estimated Creatinine Clearance: 35 mL/min (by C-G formula based on SCr of 1.73 mg/dL). No results for input(s): VANCOTROUGH, VANCOPEAK, VANCORANDOM, GENTTROUGH, GENTPEAK, GENTRANDOM, TOBRATROUGH, TOBRAPEAK, TOBRARND, AMIKACINPEAK,  AMIKACINTROU, AMIKACIN in the last 72 hours.   Microbiology: Recent Results (from the past 720 hour(s))  Blood Culture (routine x 2)     Status: None (Preliminary result)   Collection Time: 06/23/2016  9:18 AM  Result Value Ref Range Status   Specimen Description BLOOD LEFT HAND  Final   Special Requests   Final    BOTTLES DRAWN AEROBIC AND ANAEROBIC  AER [place] ANA 5CC   Culture  Setup Time   Final    GRAM NEGATIVE RODS AEROBIC BOTTLE ONLY CRITICAL RESULT CALLED TO, READ BACK BY AND VERIFIED WITH: Kaegan Stigler [place] AT [place] 07/09/16 MSS. GRAM POSITIVE COCCI ANAEROBIC BOTTLE ONLY CRITICAL RESULT CALLED TO, READ BACK BY AND VERIFIED WITH: Carold Eisner AT [id number] ON 07/09/16.Marland KitchenMarland KitchenBlue [facility] Hospital CONFIRMED BY [facility] Organism ID to follow    Culture PENDING  Incomplete   Report Status PENDING  Incomplete  Blood Culture (routine x 2)     Status: None (Preliminary result)   Collection Time: 07/02/2016  9:18 AM  Result Value Ref Range Status   Specimen Description BLOOD RIGHT HAND  Final   Special Requests BOTTLES DRAWN AEROBIC AND ANAEROBIC  5CC  Final   Culture  Setup Time   Final    GRAM NEGATIVE RODS AEROBIC BOTTLE ONLY CRITICAL VALUE NOTED.  VALUE IS CONSISTENT WITH PREVIOUSLY REPORTED AND CALLED VALUE. Saulo Anthis PH AT 0025 07/09/16 MSS. CONFIRMED BY [facility]    Culture PENDING  Incomplete   Report Status PENDING  Incomplete  Blood Culture ID Panel (Reflexed)     Status: Abnormal   Collection Time: 07/14/2016  9:18 AM  Result Value Ref Range Status   Enterococcus species NOT DETECTED NOT DETECTED Final   Vancomycin resistance NOT DETECTED NOT DETECTED Final   Listeria monocytogenes NOT DETECTED NOT DETECTED Final   Staphylococcus species NOT DETECTED NOT DETECTED Final   Staphylococcus aureus NOT DETECTED NOT DETECTED Final   Methicillin resistance NOT DETECTED NOT DETECTED Final   Streptococcus species NOT DETECTED NOT DETECTED Final   Streptococcus agalactiae NOT DETECTED NOT DETECTED Final    Streptococcus pneumoniae NOT DETECTED NOT DETECTED Final   Streptococcus pyogenes NOT DETECTED NOT DETECTED Final   Acinetobacter baumannii NOT DETECTED NOT DETECTED Final   Enterobacteriaceae species NOT DETECTED NOT DETECTED Final   Enterobacter cloacae complex NOT DETECTED NOT DETECTED Final   Escherichia coli DETECTED (A) NOT DETECTED Final    Comment: CRITICAL RESULT CALLED TO, READ BACK BY AND VERIFIED WITH: Areg Bialas [place] AT 0025 07/09/16 MSS.    Klebsiella oxytoca NOT DETECTED NOT DETECTED Final   Klebsiella pneumoniae NOT DETECTED NOT DETECTED Final   Proteus species NOT DETECTED NOT DETECTED Final   Serratia marcescens NOT DETECTED NOT DETECTED Final   Carbapenem resistance NOT DETECTED NOT DETECTED Final   Haemophilus influenzae NOT DETECTED NOT DETECTED Final   Neisseria meningitidis NOT DETECTED NOT DETECTED Final   Pseudomonas aeruginosa NOT DETECTED NOT DETECTED Final   Candida albicans NOT DETECTED NOT DETECTED Final   Candida glabrata NOT DETECTED NOT DETECTED Final   Candida krusei NOT DETECTED NOT DETECTED Final   Candida parapsilosis NOT DETECTED NOT DETECTED Final   Candida tropicalis NOT DETECTED NOT DETECTED Final  MRSA PCR Screening     Status: None   Collection Time: 06/22/2016  1:40 PM  Result Value Ref Range Status   MRSA by PCR NEGATIVE NEGATIVE Final    Comment:        The GeneXpert MRSA Assay (FDA approved for NASAL specimens only), is one component of a comprehensive MRSA colonization surveillance program. It is not intended to diagnose MRSA infection nor to guide or monitor treatment for MRSA infections.     Medical History: Past Medical History:  Diagnosis Date  . Anemia   . Anemia   . Anxiety   . Arthritis   . CAD (coronary artery disease)   . Candidiasis   . CHF (congestive heart failure) ([place])   . Chronic kidney disease   . Chronic pain   . COPD (chronic obstructive pulmonary disease) ([place])   . Coronary artery disease   .  Depression   . Diabetes mellitus without complication ([place])   . Edema   . GERD (gastroesophageal reflux disease)   . GERD (gastroesophageal reflux disease)   . Heart disease   . Heel ulcer ([place])   . Hyperkalemia   . Hyperlipidemia   . Hypertension   . Long term current use of antibiotics   . Malnutrition ([place])   . MRSA (methicillin resistant Staphylococcus aureus)   . Myocardial infarction ([place])   . Necrotizing fasciitis ([place])   . Nephrolithiasis   . Osteoporosis   . Sacral pressure ulcer   . Seasonal allergies   . Shortness of breath   . Ulcers of both lower legs ([place])    Non-pressure related  . Upper respiratory infection   . Urinary retention   . UTI (lower urinary tract infection)     Medications:  Prescriptions Prior to Admission  Medication Sig Dispense Refill Last Dose  . acetaminophen (TYLENOL) 325 MG tablet Take 2 tablets (650 mg total) by  mouth every 6 (six) hours as needed for mild pain (or Fever >/= 101). (Patient taking differently: Take 650 mg by mouth every 6 (six) hours as needed for mild pain or headache. ) 60 tablet 0 unknown  . acetaminophen (TYLENOL) 650 MG CR tablet Take 650 mg by mouth 3 (three) times daily.   unknown  . alum & mag hydroxide-simeth (GERI-LANTA) 200-200-20 MG/5ML suspension Take 30 mLs by mouth every 4 (four) hours as needed for indigestion or heartburn.   unknown  . antiseptic oral rinse (CPC / CETYLPYRIDINIUM CHLORIDE 0.05%) 0.05 % LIQD solution 7 mLs by Mouth Rinse route 2 times daily at 12 noon and 4 pm. 354 mL 0 unknown  . ascorbic acid (VITAMIN C) 1000 MG tablet Take 1,000 mg by mouth daily.   unknown  . Cholecalciferol (VITAMIN D3) 50000 units CAPS Take 50,000 Units by mouth every Wednesday.   unknown  . cyanocobalamin (,VITAMIN B-12,) 1000 MCG/ML injection Inject 1,000 mcg into the muscle every 30 (thirty) days. Given on the 26th day of the month   unknown  . dextromethorphan-guaiFENesin (MUCINEX DM) 30-600 MG 12hr tablet Take 1  tablet by mouth at bedtime.   unknown  . diltiazem (CARDIZEM SR) 120 MG 12 hr capsule Take 120 mg by mouth 2 (two) times daily.   unknown  . diphenhydrAMINE (BENADRYL) 25 mg capsule Take 25 mg by mouth every 6 (six) hours as needed for itching.   unknown  . escitalopram (LEXAPRO) 10 MG tablet Take 10 mg by mouth daily.   unknown  . etodolac (LODINE) 400 MG tablet Take 400 mg by mouth 2 (two) times daily.   unknown  . ferrous sulfate 325 (65 FE) MG tablet Take 325 mg by mouth 3 (three) times daily.    unknown  . Fluticasone-Salmeterol (ADVAIR) 100-50 MCG/DOSE AEPB Inhale 1 puff into the lungs 2 (two) times daily as needed. For shortness of breath. *Wait 5 minutes between inhalers*   unknown  . folic acid (FOLVITE) [id number] MCG tablet Take 800 mcg by mouth daily.   unknown  . furosemide (LASIX) 20 MG tablet Take 20 mg by mouth 2 (two) times daily.   unknown  . guaiFENesin (MUCINEX) 600 MG 12 hr tablet Take 300 mg by mouth daily.   unknown  . HYDROcodone-acetaminophen (NORCO/VICODIN) 5-325 MG tablet Take 1 tablet by mouth every 4 (four) hours as needed for moderate pain or severe pain.   unknown  . hydroxychloroquine (PLAQUENIL) 200 MG tablet Take 400 mg by mouth daily.   unknown  . ibandronate (BONIVA) 3 MG/3ML SOLN injection Inject 3 mg into the vein every 3 (three) months. Taken on the 13th day of the month   unknown  . insulin lispro (HUMALOG) 100 UNIT/ML cartridge Inject into the skin 3 (three) times daily before meals. Use per sliding scale:  If blood sugar is less than 60 , Call MD 151-200= 2 units, 201-250= 4 units, 251-300= 6 units, 301-350= 8 units, 351-400= 10 units, 401-450= 12 units,     > 450= Call MD   unknown  . lisinopril (PRINIVIL,ZESTRIL) 5 MG tablet Take 1 tablet (5 mg total) by mouth at bedtime. 30 tablet 6 unknown  . LORazepam (ATIVAN) 0.5 MG tablet Take 0.5 mg by mouth every 8 (eight) hours as needed for anxiety.    unknown  . metFORMIN (GLUCOPHAGE) 500 MG tablet Take 500 mg by  mouth 2 (two) times daily.   unknown  . metoprolol tartrate (LOPRESSOR) 25 MG tablet Take 25  mg by mouth every 12 (twelve) hours.    unknown  . Multiple Vitamins-Minerals (CENTRUM SILVER PO) Take 1 tablet by mouth daily.    unknown  . nystatin (MYCOSTATIN) 100000 UNIT/ML suspension Take 5 mLs by mouth 4 (four) times daily. *Swish and swallow*   unknown  . Omega-3 Fatty Acids (FISH OIL) 500 MG CAPS Take 1,000 mg by mouth daily.   unknown  . omeprazole (PRILOSEC) 20 MG capsule Take 20 mg by mouth every 12 (twelve) hours.    unknown  . oxybutynin (DITROPAN) 5 MG tablet Take 5 mg by mouth every 12 (twelve) hours.    unknown  . pentoxifylline (TRENTAL) 400 MG CR tablet Take 400 mg by mouth 3 (three) times daily with meals.   unknown  . potassium chloride (KLOR-CON M10) 10 MEQ tablet Take 1 tablet (10 mEq total) by mouth daily. Take with Lasix 30 tablet 0 unknown  . predniSONE (DELTASONE) 10 MG tablet Take 10 mg by mouth daily.   unknown  . promethazine (PHENERGAN) 25 MG tablet Take 25 mg by mouth every 6 (six) hours as needed for nausea or vomiting.   unknown  . tiotropium (SPIRIVA) 18 MCG inhalation capsule Place 1 capsule (18 mcg total) into inhaler and inhale daily. 30 capsule 12 unknown  . traMADol (ULTRAM) 50 MG tablet Take 1 tablet (50 mg total) by mouth every 4 (four) hours as needed for moderate pain. (Patient taking differently: Take 50 mg by mouth every 4 (four) hours as needed for moderate pain. Mild to moderate pain.) 30 tablet 0 unknown  . vitamin A 8000 UNIT capsule Take 8,000 Units by mouth daily.   unknown  . albuterol (PROVENTIL) (2.5 MG/3ML) 0.083% nebulizer solution Take 3 mLs (2.5 mg total) by nebulization every 6 (six) hours as needed for wheezing or shortness of breath. 75 mL 12 Past Week at Unknown time  . aspirin EC 81 MG EC tablet Take 1 tablet (81 mg total) by mouth daily. 30 tablet 0 01/02/2016 at Unknown time  . bismuth subsalicylate (PEPTO BISMOL) 262 MG/15ML suspension Take 30  mLs by mouth every 4 (four) hours as needed for diarrhea or loose stools. 360 mL 0 Past Week at Unknown time  . loperamide (IMODIUM) 2 MG capsule Take 1 capsule (2 mg total) by mouth every 6 (six) hours as needed for diarrhea or loose stools. 30 capsule 0 01/02/2016 at Unknown time  . magic mouthwash SOLN Take 10 mLs by mouth 4 (four) times daily as needed (candidiasis). 200 mL 6 01/02/2016 at Unknown time   Assessment: CrCl = 35 ml/min Ke =  0.033 hr-1 T1/2 = 21 hrs Vd = 65.8 L   Goal of Therapy:  Vancomycin trough level 15-20 mcg/ml  Plan:  Expected duration 7 days with resolution of temperature and/or normalization of WBC   Vancomycin 1250 mg IV X 1 to be given on 8/20 @ 0700. Vancomycin 1250 mg IV Q24H ordered to be given 8/20 @ 1400, ~ 7 hrs after 1st dose (stacked dosing).  This pt will reach [facility] by 8/24 @ 7:00. Will draw 1st trough on 8/24 @ 13:30, which will be at [facility].   Suzanne Ewing 07/09/2016,6:31 AM

## 2016-07-09 NOTE — Consult Note (Signed)
[facility] CONSULT NOTE    Date: 07/09/2016                  Patient Name:  Suzanne Ewing  MRN: [id number]  DOB: 10-20-1946  Age / Sex: 70 y.o., female         PCP: Juluis Pitch, MD                 Service Requesting Consult: Dr. Stevenson Clinch                 Reason for Consult: Acute renal failure            History of Present Illness: Patient is a 70 y.o. female with a PMHx of Coronary artery disease, diabetes mellitus type 2, GERD, hypertension, history of necrotizing fasciitis, history of colostomy placement, Nephrolithiasis with recent lithotripsy who was admitted to [facility] on 07/13/2016 for evaluation of Hypotension, confusion, and altered mental status. The patient originally underwent cystoscopy on 06/27/2016. She also had lithotripsy at that time andan indwelling ureteral stent was inserted on the left. Patient subsequently had urology follow-up on 07/04/2016 and the patient was having voiding issues at the time and urinalysis demonstrated 3+ nitrates. She came back yesterday with septic shock with Escherichia coli in the blood. Patient had a CT scan of the abdomen and pelvis which showed obstruction on the left. Urology saw the patient and placed another left ureteral stent. The patient is on multiple pressors now and has been intubated. She has only made 225 cc of urine since admission. She remains critically ill at this time.  Given ongoing sepsis and oliguria we discussed continuous renal replacement therapy with the patient's family and they have agreed to proceed. The patient also has metabolic acidosis and is on bicarbonate drip.   Medications: Outpatient medications: Prescriptions Prior to Admission  Medication Sig Dispense Refill Last Dose  . acetaminophen (TYLENOL) 325 MG tablet Take 2 tablets (650 mg total) by mouth every 6 (six) hours as needed for mild pain (or Fever >/= 101). (Patient taking differently: Take 650 mg by mouth every 6 (six) hours as  needed for mild pain or headache. ) 60 tablet 0 unknown  . acetaminophen (TYLENOL) 650 MG CR tablet Take 650 mg by mouth 3 (three) times daily.   unknown  . alum & mag hydroxide-simeth (GERI-LANTA) 200-200-20 MG/5ML suspension Take 30 mLs by mouth every 4 (four) hours as needed for indigestion or heartburn.   unknown  . antiseptic oral rinse (CPC / CETYLPYRIDINIUM CHLORIDE 0.05%) 0.05 % LIQD solution 7 mLs by Mouth Rinse route 2 times daily at 12 noon and 4 pm. 354 mL 0 unknown  . ascorbic acid (VITAMIN C) 1000 MG tablet Take 1,000 mg by mouth daily.   unknown  . Cholecalciferol (VITAMIN D3) 50000 units CAPS Take 50,000 Units by mouth every Wednesday.   unknown  . cyanocobalamin (,VITAMIN B-12,) 1000 MCG/ML injection Inject 1,000 mcg into the muscle every 30 (thirty) days. Given on the 26th day of the month   unknown  . dextromethorphan-guaiFENesin (MUCINEX DM) 30-600 MG 12hr tablet Take 1 tablet by mouth at bedtime.   unknown  . diltiazem (CARDIZEM SR) 120 MG 12 hr capsule Take 120 mg by mouth 2 (two) times daily.   unknown  . diphenhydrAMINE (BENADRYL) 25 mg capsule Take 25 mg by mouth every 6 (six) hours as needed for itching.   unknown  . escitalopram (LEXAPRO) 10 MG tablet Take 10 mg by mouth  daily.   unknown  . etodolac (LODINE) 400 MG tablet Take 400 mg by mouth 2 (two) times daily.   unknown  . ferrous sulfate 325 (65 FE) MG tablet Take 325 mg by mouth 3 (three) times daily.    unknown  . Fluticasone-Salmeterol (ADVAIR) 100-50 MCG/DOSE AEPB Inhale 1 puff into the lungs 2 (two) times daily as needed. For shortness of breath. *Wait 5 minutes between inhalers*   unknown  . folic acid (FOLVITE) 400 MCG tablet Take 800 mcg by mouth daily.   unknown  . furosemide (LASIX) 20 MG tablet Take 20 mg by mouth 2 (two) times daily.   unknown  . guaiFENesin (MUCINEX) 600 MG 12 hr tablet Take 300 mg by mouth daily.   unknown  . HYDROcodone-acetaminophen (NORCO/VICODIN) 5-325 MG tablet Take 1 tablet by mouth  every 4 (four) hours as needed for moderate pain or severe pain.   unknown  . hydroxychloroquine (PLAQUENIL) 200 MG tablet Take 400 mg by mouth daily.   unknown  . ibandronate (BONIVA) 3 MG/3ML SOLN injection Inject 3 mg into the vein every 3 (three) months. Taken on the 13th day of the month   unknown  . insulin lispro (HUMALOG) 100 UNIT/ML cartridge Inject into the skin 3 (three) times daily before meals. Use per sliding scale:  If blood sugar is less than 60 , Call MD 151-200= 2 units, 201-250= 4 units, 251-300= 6 units, 301-350= 8 units, 351-400= 10 units, 401-450= 12 units,     > 450= Call MD   unknown  . lisinopril (PRINIVIL,ZESTRIL) 5 MG tablet Take 1 tablet (5 mg total) by mouth at bedtime. 30 tablet 6 unknown  . LORazepam (ATIVAN) 0.5 MG tablet Take 0.5 mg by mouth every 8 (eight) hours as needed for anxiety.    unknown  . metFORMIN (GLUCOPHAGE) 500 MG tablet Take 500 mg by mouth 2 (two) times daily.   unknown  . metoprolol tartrate (LOPRESSOR) 25 MG tablet Take 25 mg by mouth every 12 (twelve) hours.    unknown  . Multiple Vitamins-Minerals (CENTRUM SILVER PO) Take 1 tablet by mouth daily.    unknown  . nystatin (MYCOSTATIN) 100000 UNIT/ML suspension Take 5 mLs by mouth 4 (four) times daily. *Swish and swallow*   unknown  . Omega-3 Fatty Acids (FISH OIL) 500 MG CAPS Take 1,000 mg by mouth daily.   unknown  . omeprazole (PRILOSEC) 20 MG capsule Take 20 mg by mouth every 12 (twelve) hours.    unknown  . oxybutynin (DITROPAN) 5 MG tablet Take 5 mg by mouth every 12 (twelve) hours.    unknown  . pentoxifylline (TRENTAL) 400 MG CR tablet Take 400 mg by mouth 3 (three) times daily with meals.   unknown  . potassium chloride (KLOR-CON M10) 10 MEQ tablet Take 1 tablet (10 mEq total) by mouth daily. Take with Lasix 30 tablet 0 unknown  . predniSONE (DELTASONE) 10 MG tablet Take 10 mg by mouth daily.   unknown  . promethazine (PHENERGAN) 25 MG tablet Take 25 mg by mouth every 6 (six) hours as  needed for nausea or vomiting.   unknown  . tiotropium (SPIRIVA) 18 MCG inhalation capsule Place 1 capsule (18 mcg total) into inhaler and inhale daily. 30 capsule 12 unknown  . traMADol (ULTRAM) 50 MG tablet Take 1 tablet (50 mg total) by mouth every 4 (four) hours as needed for moderate pain. (Patient taking differently: Take 50 mg by mouth every 4 (four) hours as needed for moderate  pain. Mild to moderate pain.) 30 tablet 0 unknown  . vitamin A 8000 UNIT capsule Take 8,000 Units by mouth daily.   unknown  . albuterol (PROVENTIL) (2.5 MG/3ML) 0.083% nebulizer solution Take 3 mLs (2.5 mg total) by nebulization every 6 (six) hours as needed for wheezing or shortness of breath. 75 mL 12 Past Week at Unknown time  . aspirin EC 81 MG EC tablet Take 1 tablet (81 mg total) by mouth daily. 30 tablet 0 01/02/2016 at Unknown time  . bismuth subsalicylate (PEPTO BISMOL) 262 MG/15ML suspension Take 30 mLs by mouth every 4 (four) hours as needed for diarrhea or loose stools. 360 mL 0 Past Week at Unknown time  . loperamide (IMODIUM) 2 MG capsule Take 1 capsule (2 mg total) by mouth every 6 (six) hours as needed for diarrhea or loose stools. 30 capsule 0 01/02/2016 at Unknown time  . magic mouthwash SOLN Take 10 mLs by mouth 4 (four) times daily as needed (candidiasis). 200 mL 6 01/02/2016 at Unknown time    Current medications: Current Facility-Administered Medications  Medication Dose Route Frequency Provider Last Rate Last Dose  . 0.9 %  sodium chloride infusion  250 mL Intravenous PRN Vishal Mungal, MD      . acetaminophen (TYLENOL) suppository 650 mg  650 mg Rectal Q4H PRN Mikael Spray, NP   650 mg at 07/09/16 0044  . acetaminophen (TYLENOL) tablet 650 mg  650 mg Oral Q4H PRN Vishal Mungal, MD      . antiseptic oral rinse solution (CORINZ)  7 mL Mouth Rinse Q2H Magadalene S Tukov, NP   7 mL at 07/09/16 0911  . budesonide (PULMICORT) nebulizer solution 0.5 mg  0.5 mg Nebulization BID Vishal Mungal, MD    0.5 mg at 07/09/16 0815  . chlorhexidine gluconate (SAGE KIT) (PERIDEX) 0.12 % solution 15 mL  15 mL Mouth Rinse BID Vishal Mungal, MD   15 mL at 07/09/16 0753  . famotidine (PEPCID) IVPB 20 mg premix  20 mg Intravenous Q24H Vishal Mungal, MD   20 mg at 06/26/2016 2130  . fentaNYL (SUBLIMAZE) injection 50 mcg  50 mcg Intravenous Q15 min PRN Vishal Mungal, MD   50 mcg at 07/07/2016 1553  . fentaNYL (SUBLIMAZE) injection 50 mcg  50 mcg Intravenous Q2H PRN Vishal Mungal, MD      . fentaNYL 2524mg in NS 2523m(1059mml) infusion-PREMIX  10 mcg/hr Intravenous Continuous Vishal Mungal, MD 25 mL/hr at 07/09/16 0859 250 mcg/hr at 07/09/16 0859  . heparin injection 5,000 Units  5,000 Units Subcutaneous Q8H Vishal Mungal, MD   5,000 Units at 07/09/16 0906  . hydrocortisone sodium succinate (SOLU-CORTEF) 100 MG injection 50 mg  50 mg Intravenous Q6H Vishal Mungal, MD      . insulin aspart (novoLOG) injection 2-6 Units  2-6 Units Subcutaneous Q4H Vishal Mungal, MD      . ipratropium-albuterol (DUONEB) 0.5-2.5 (3) MG/3ML nebulizer solution 3 mL  3 mL Nebulization Q6H Vishal Mungal, MD   3 mL at 07/09/16 0759  . [START ON 8/208/27/17pratropium-albuterol (DUONEB) 0.5-2.5 (3) MG/3ML nebulizer solution 3 mL  3 mL Nebulization Q6H PRN Vishal Mungal, MD      . magnesium sulfate IVPB 2 g 50 mL  2 g Intravenous Once VisVilinda BoehringerD   2 g at 07/09/16 0906  . meropenem (MERREM) 1 g in sodium chloride 0.9 % 100 mL IVPB  1 g Intravenous Q12H Vishal Mungal, MD   1 g at 07/09/16 0906  .  midazolam (VERSED) injection 1 mg  1 mg Intravenous Q2H PRN Vilinda Boehringer, MD   1 mg at 07/09/16 0752  . norepinephrine (LEVOPHED) 16 mg in dextrose 5 % 250 mL (0.064 mg/mL) infusion  0-40 mcg/min Intravenous Titrated Vira Blanco, RPH 20.6 mL/hr at 07/09/16 0900 22 mcg/min at 07/09/16 0900  . ondansetron (ZOFRAN) injection 4 mg  4 mg Intravenous Q6H PRN Vishal Mungal, MD      . sodium bicarbonate 150 mEq in sterile water 1,000 mL infusion    Intravenous Continuous Mikael Spray, NP 150 mL/hr at 07/09/16 [id number]    . vancomycin (VANCOCIN) 1,250 mg in sodium chloride 0.9 % 250 mL IVPB  1,250 mg Intravenous Q24H Saundra Shelling, MD   1,250 mg at 07/09/16 7106  . vasopressin (PITRESSIN) 40 Units in sodium chloride 0.9 % 250 mL (0.16 Units/mL) infusion  0.01-0.04 Units/min Intravenous Continuous Vishal Mungal, MD 11.3 mL/hr at 07/04/2016 1848 0.03 Units/min at 07/09/2016 1848      Allergies: Allergies  Allergen Reactions  . Macrobid [Nitrofurantoin Monohyd Macro] Itching, Rash and Swelling    PT HOSPITALIZED  . Penicillins Shortness Of Breath, Rash and Other (See Comments)    Has patient had a PCN reaction causing immediate rash, facial/tongue/throat swelling, SOB or lightheadedness with hypotension: Yes Has patient had a PCN reaction causing severe rash involving mucus membranes or skin necrosis: No Has patient had a PCN reaction that required hospitalization Yes Has patient had a PCN reaction occurring within the last 10 years: No If all of the above answers are "NO", then may proceed with Cephalosporin use.   . Tape Rash  . Azithromycin Other (See Comments)    Reaction: unknown   . Clarithromycin Other (See Comments)    Abdominal pain  . Nitroglycerin Other (See Comments)    Reaction - unknown  . Leflunomide Rash  . Morphine Nausea And Vomiting    Nausea and vomiting  . Naproxen Rash      Past Medical History: Past Medical History:  Diagnosis Date  . Anemia   . Anemia   . Anxiety   . Arthritis   . CAD (coronary artery disease)   . Candidiasis   . CHF (congestive heart failure) ([place])   . Chronic kidney disease   . Chronic pain   . COPD (chronic obstructive pulmonary disease) ([place])   . Coronary artery disease   . Depression   . Diabetes mellitus without complication ([place])   . Edema   . GERD (gastroesophageal reflux disease)   . GERD (gastroesophageal reflux disease)   . Heart disease   . Heel ulcer ([place])   .  Hyperkalemia   . Hyperlipidemia   . Hypertension   . Long term current use of antibiotics   . Malnutrition ([place])   . MRSA (methicillin resistant Staphylococcus aureus)   . Myocardial infarction ([place])   . Necrotizing fasciitis ([place])   . Nephrolithiasis   . Osteoporosis   . Sacral pressure ulcer   . Seasonal allergies   . Shortness of breath   . Ulcers of both lower legs ([place])    Non-pressure related  . Upper respiratory infection   . Urinary retention   . UTI (lower urinary tract infection)      Past Surgical History: Past Surgical History:  Procedure Laterality Date  . BACK SURGERY    . CARPAL TUNNEL RELEASE Bilateral   . CHOLECYSTECTOMY    . CORONARY ARTERY BYPASS GRAFT    . JOINT REPLACEMENT    .  KNEE SURGERY Left   . PERIPHERAL VASCULAR CATHETERIZATION Left 01/03/2016   Procedure: Lower Extremity Angiography;  Surgeon: Algernon Huxley, MD;  Location: [facility];  Service: Cardiovascular;  Laterality: Left;  . PERIPHERAL VASCULAR CATHETERIZATION  01/03/2016   Procedure: Lower Extremity Intervention;  Surgeon: Algernon Huxley, MD;  Location: [facility];  Service: Cardiovascular;;  . SHOULDER SURGERY       Family History: Family History  Problem Relation Age of Onset  . Stroke Mother   . Diabetes type II Sister   . Hypertension Sister      Social History: Social History   Social History  . Marital status: Married    Spouse name: N/A  . Number of children: N/A  . Years of education: N/A   Occupational History  . retired    Social History Main Topics  . Smoking status: Former Smoker    Packs/day: 0.50    Years: 20.00    Types: Cigarettes    Quit date: 06/03/2014  . Smokeless tobacco: Never Used  . Alcohol use No  . Drug use: No  . Sexual activity: Not on file   Other Topics Concern  . Not on file   Social History Narrative   Resident of nursing home.     Review of Systems: Unable to obtain from patient as she is  intubated/sedated.  Vital Signs: Blood pressure 108/64, pulse (!) 113, temperature 98.8 F (37.1 C), resp. rate (!) 6, height 5' 6"  (1.676 m), weight 94 kg (207 lb 3.7 oz), SpO2 100 %.  Weight trends: Filed Weights   07/15/2016 0949 07/17/2016 1334 07/09/16 0448  Weight: 87.1 kg (192 lb) 90.2 kg (198 lb 13.7 oz) 94 kg (207 lb 3.7 oz)    Physical Exam: General: Critically ill appearing  Head: Normocephalic, atraumatic.  Eyes: Anicteric, EOMI  Nose: Mucous membranes moist, not inflammed, nonerythematous.  Throat: ETT/OG in place  Neck: Supple, trachea midline.  Lungs:  Bilateral rhonchi, vent supported  Heart: S1S2 tachycardic  Abdomen:  BS normoactive. Soft, Nondistended, non-tender.  Colostomy noted  Extremities: No pretibial edema.  Neurologic: On the ventilator  Skin: No visible rashes, scars.    Lab results: Basic Metabolic Panel:  Recent Labs Lab 06/27/2016 0918 07/17/2016 1822 07/09/16 0430  NA 141 138 138  K 4.1 4.1 4.3  CL 113* 114* 108  CO2 18* 19* 20*  GLUCOSE 65 111* 97  BUN 45* 43* 43*  CREATININE 1.50* 1.53* 1.73*  CALCIUM 9.5 8.0* 7.8*  MG  --  1.3* 1.7  PHOS  --  3.4 3.3    Liver Function Tests:  Recent Labs Lab 07/02/2016 0918 06/29/2016 1822  AST 76* 123*  ALT 46 77*  ALKPHOS 92 77  BILITOT 0.5 0.5  PROT 5.6* 5.1*  ALBUMIN 2.3* 2.0*   No results for input(s): LIPASE, AMYLASE in the last 168 hours. No results for input(s): AMMONIA in the last 168 hours.  CBC:  Recent Labs Lab 06/21/2016 0918 06/28/2016 1822 07/09/16 0430  WBC 5.9 19.9* 16.1*  NEUTROABS 5.7 18.5*  --   HGB 9.1* 8.0* 7.9*  HCT 29.0* 26.6* 25.2*  MCV 79.3* 80.2 78.3*  PLT 143* 138* 104*    Cardiac Enzymes:  Recent Labs Lab 06/22/2016 0918  TROPONINI 0.06*    BNP: Invalid input(s): POCBNP  CBG:  Recent Labs Lab 07/06/2016 1931 07/16/2016 2016 07/09/16 0008 07/09/16 0337 07/09/16 0708  GLUCAP 66 93 90 83 101*    Microbiology: Results  for orders placed or  performed during the hospital encounter of 07/04/2016  Blood Culture (routine x 2)     Status: None (Preliminary result)   Collection Time: 07/09/2016  9:18 AM  Result Value Ref Range Status   Specimen Description BLOOD LEFT HAND  Final   Special Requests   Final    BOTTLES DRAWN AEROBIC AND ANAEROBIC  AER [place] ANA 5CC   Culture  Setup Time   Final    GRAM NEGATIVE RODS AEROBIC BOTTLE ONLY CRITICAL RESULT CALLED TO, READ BACK BY AND VERIFIED WITH: JASON ROBBINS [place] AT [place] 07/09/16 MSS. GRAM POSITIVE COCCI ANAEROBIC BOTTLE ONLY CRITICAL RESULT CALLED TO, READ BACK BY AND VERIFIED WITH: JASON ROBBINS AT 7017 ON 07/09/16.Mar[facility] CON[facility]IR[facility] BY [facility]    Culture   Final    CULTURE REINCUBATED FOR BETTER GROWTH Performed at [facility]    Report Status PENDING  Incomplete  Blood Culture (routine x 2)     Status: None (Preliminary result)   Collection Time: 07/01/2016  9:18 AM  Result Value Ref Range Status   Specimen Description BLOOD RIGHT HAND  Final   Special Requests BOTTLES DRAWN AEROBIC AND ANAEROBIC  5CC  Final   Culture  Setup Time   Final    GRAM NEGATIVE RODS AEROBIC BOTTLE ONLY CRITICAL VALUE NOTED.  VALUE IS CONSISTENT WITH PREVIOUSLY REPORTED AND CALLED VALUE. JASON ROBBINS PH AT 0025 07/09/16 MSS. CONFIRMED BY [facility]    Culture PENDING  Incomplete   Report Status PENDING  Incomplete  Blood Culture ID Panel (Reflexed)     Status: Abnormal   Collection Time: 06/22/2016  9:18 AM  Result Value Ref Range Status   Enterococcus species NOT DETECTED NOT DETECTED Final   Vancomycin resistance NOT DETECTED NOT DETECTED Final   Listeria monocytogenes NOT DETECTED NOT DETECTED Final   Staphylococcus species NOT DETECTED NOT DETECTED Final   Staphylococcus aureus NOT DETECTED NOT DETECTED Final   Methicillin resistance NOT DETECTED NOT DETECTED Final   Streptococcus species NOT DETECTED NOT DETECTED Final   Streptococcus agalactiae NOT DETECTED NOT DETECTED Final   Streptococcus  pneumoniae NOT DETECTED NOT DETECTED Final   Streptococcus pyogenes NOT DETECTED NOT DETECTED Final   Acinetobacter baumannii NOT DETECTED NOT DETECTED Final   Enterobacteriaceae species NOT DETECTED NOT DETECTED Final   Enterobacter cloacae complex NOT DETECTED NOT DETECTED Final   Escherichia coli DETECTED (A) NOT DETECTED Final    Comment: CRITICAL RESULT CALLED TO, READ BACK BY AND VERIFIED WITH: JASON ROBBINS [place] AT 0025 07/09/16 MSS.    Klebsiella oxytoca NOT DETECTED NOT DETECTED Final   Klebsiella pneumoniae NOT DETECTED NOT DETECTED Final   Proteus species NOT DETECTED NOT DETECTED Final   Serratia marcescens NOT DETECTED NOT DETECTED Final   Carbapenem resistance NOT DETECTED NOT DETECTED Final   Haemophilus influenzae NOT DETECTED NOT DETECTED Final   Neisseria meningitidis NOT DETECTED NOT DETECTED Final   Pseudomonas aeruginosa NOT DETECTED NOT DETECTED Final   Candida albicans NOT DETECTED NOT DETECTED Final   Candida glabrata NOT DETECTED NOT DETECTED Final   Candida krusei NOT DETECTED NOT DETECTED Final   Candida parapsilosis NOT DETECTED NOT DETECTED Final   Candida tropicalis NOT DETECTED NOT DETECTED Final  Blood Culture ID Panel (Reflexed)     Status: Abnormal   Collection Time: 07/07/2016  9:18 AM  Result Value Ref Range Status   Enterococcus species NOT DETECTED NOT DETECTED Final   Vancomycin resistance NOT DETECTED NOT DETECTED Final  Listeria monocytogenes NOT DETECTED NOT DETECTED Final   Staphylococcus species DETECTED (A) NOT DETECTED Final    Comment: CRITICAL RESULT CALLED TO, READ BACK BY AND VERIFIED WITH: JASON ROBBINS AT 2751 ON 07/09/16.[place] KitchenMarland KitchenSutter [place]    Staphylococcus aureus NOT DETECTED NOT DETECTED Final   Methicillin resistance NOT DETECTED NOT DETECTED Final   Streptococcus species DETECTED (A) NOT DETECTED Final    Comment: CRITICAL RESULT CALLED TO, READ BACK BY AND VERIFIED WITH: JASON ROBBINS AT 7001 ON 07/09/16.Marland KitchenMarland KitchenNorth [place]    Streptococcus agalactiae  NOT DETECTED NOT DETECTED Final   Streptococcus pneumoniae NOT DETECTED NOT DETECTED Final   Streptococcus pyogenes NOT DETECTED NOT DETECTED Final   Acinetobacter baumannii NOT DETECTED NOT DETECTED Final   Enterobacteriaceae species NOT DETECTED NOT DETECTED Final   Enterobacter cloacae complex NOT DETECTED NOT DETECTED Final   Escherichia coli NOT DETECTED NOT DETECTED Final   Klebsiella oxytoca NOT DETECTED NOT DETECTED Final   Klebsiella pneumoniae NOT DETECTED NOT DETECTED Final   Proteus species NOT DETECTED NOT DETECTED Final   Serratia marcescens NOT DETECTED NOT DETECTED Final   Carbapenem resistance NOT DETECTED NOT DETECTED Final   Haemophilus influenzae NOT DETECTED NOT DETECTED Final   Neisseria meningitidis NOT DETECTED NOT DETECTED Final   Pseudomonas aeruginosa NOT DETECTED NOT DETECTED Final   Candida albicans NOT DETECTED NOT DETECTED Final   Candida glabrata NOT DETECTED NOT DETECTED Final   Candida krusei NOT DETECTED NOT DETECTED Final   Candida parapsilosis NOT DETECTED NOT DETECTED Final   Candida tropicalis NOT DETECTED NOT DETECTED Final  MRSA PCR Screening     Status: None   Collection Time: 07/01/2016  1:40 PM  Result Value Ref Range Status   MRSA by PCR NEGATIVE NEGATIVE Final    Comment:        The GeneXpert MRSA Assay (FDA approved for NASAL specimens only), is one component of a comprehensive MRSA colonization surveillance program. It is not intended to diagnose MRSA infection nor to guide or monitor treatment for MRSA infections.     Coagulation Studies: No results for input(s): LABPROT, INR in the last 72 hours.  Urinalysis:  Recent Labs  07/03/2016 0918  COLORURINE YELLOW*  LABSPEC 1.013  PHURINE 5.0  GLUCOSEU NEGATIVE  HGBUR NEGATIVE  BILIRUBINUR NEGATIVE  KETONESUR NEGATIVE  PROTEINUR 100*  NITRITE NEGATIVE  LEUKOCYTESUR 3+*      Imaging: Dg Chest 1 View  Result Date: 07/09/2016 CLINICAL DATA:  Hypotensive.  Abdominal  distension. EXAM: CHEST 1 VIEW COMPARISON:  12/06/2015 FINDINGS: Mildly degraded exam due to AP portable technique and patient body habitus. Prior median sternotomy. Numerous leads and wires project over the chest. Midline trachea. Moderate cardiomegaly. Small to moderate right pleural effusion is increased. possible mild loculation laterally. No pneumothorax. Mild interstitial edema is asymmetric, worse on the right. Lower lobe predominant right-sided airspace disease. IMPRESSION: Cardiomegaly with mild interstitial edema. Increase in moderate right pleural effusion. Possible loculation laterally. Recommend attention on follow-up. Right base atelectasis or infection. Electronically Signed   By: Abigail Miyamoto M.D.   On: 07/17/2016 09:47   Dg Abd 1 View  Result Date: 07/09/2016 CLINICAL DATA:  Orogastric tube placement EXAM: ABDOMEN - 1 VIEW COMPARISON:  Earlier today FINDINGS: An orogastric tube tip and side-port overlaps the proximal stomach that is moderately distended. Left ureteral stent as noted previously. Stoma seen over the left abdomen. Known layering right pleural effusion. IMPRESSION: Orogastric tube tip over the proximal stomach which remains moderately distended by gas. Electronically Signed  By: Monte Fantasia M.D.   On: 07/09/2016 04:01   Dg Abd 1 View  Result Date: 07/09/2016 CLINICAL DATA:  Left ureteral stent placement.  Initial encounter. EXAM: ABDOMEN - 1 VIEW COMPARISON:  CT of the abdomen and pelvis performed 07/19/2016 FINDINGS: The visualized bowel gas pattern is unremarkable. Scattered air and stool filled loops of colon are seen; no abnormal dilatation of small bowel loops is seen to suggest small bowel obstruction. No free intra-abdominal air is identified, though evaluation for free air is limited on supine views. The stomach is diffusely distended with air, raising question for mild gastroparesis. A left ureteral stent is noted overlying expected position. The visualized osseous  structures are within normal limits; the sacroiliac joints are unremarkable in appearance. Lumbar spinal fusion hardware is noted at L3-L5. Scattered vascular calcifications are seen. IMPRESSION: 1. Left ureteral stent noted overlying expected position. 2. Stomach diffusely distended with air. Would correlate clinically to exclude mild gastroparesis. 3. Otherwise unremarkable bowel gas pattern; no free intra-abdominal air seen. 4. Scattered vascular calcifications seen. Electronically Signed   By: Garald Balding M.D.   On: 07/09/2016 02:59   Dg Chest Port 1 View  Result Date: 07/09/2016 CLINICAL DATA:  Postoperative radiograph, status post left ureteral stent placement. Initial encounter. EXAM: PORTABLE CHEST 1 VIEW COMPARISON:  Chest radiograph performed 07/20/2016 FINDINGS: The patient's endotracheal tube is seen ending 2-3 cm above the carina. A right IJ line is noted ending about the mid SVC. The lungs are hypoexpanded. A small right pleural effusion is noted, less prominent than on the prior study. Vascular crowding and vascular congestion are seen. Increased interstitial markings raise question for mild interstitial edema. The cardiomediastinal silhouette is borderline normal in size. The patient is status post median sternotomy, with evidence of prior CABG. There is chronic fracture of multiple sternal wires. No acute osseous abnormalities are seen. IMPRESSION: 1. Endotracheal tube seen ending 2-3 cm above the carina. 2. Lungs hypoexpanded. Small right pleural effusion is less prominent than on the prior study. Vascular congestion noted. Increased interstitial markings raise question for mild interstitial edema. Electronically Signed   By: Garald Balding M.D.   On: 07/09/2016 03:30   Dg Chest Port 1 View  Result Date: 06/21/2016 CLINICAL DATA:  Status post intubation EXAM: PORTABLE CHEST 1 VIEW COMPARISON:  07/07/2016 FINDINGS: Cardiac shadow remains enlarged. Postsurgical changes are again seen.  Endotracheal tube is now seen 2.9 cm above the carina in satisfactory position. A right jugular central line is again noted and stable. Increasing right-sided pleural effusion is noted. There is likely some underlying atelectasis present IMPRESSION: Increasing right-sided pleural effusion. Status post intubation in satisfactory position. Electronically Signed   By: Inez Catalina M.D.   On: 07/07/2016 16:56   Dg Chest Portable 1 View  Result Date: 07/16/2016 CLINICAL DATA:  Status post central line placement. EXAM: PORTABLE CHEST 1 VIEW COMPARISON:  06/22/2016 at 9:11 a.m. FINDINGS: Right internal jugular central venous line catheter tip projects in the mid superior vena cava. No pneumothorax. Right-sided pleural effusion and mild interstitial thickening is stable. No new lung abnormalities. IMPRESSION: New right internal jugular central venous line tip projects in the mid superior vena cava. No pneumothorax. No other change from the earlier study. Electronically Signed   By: Lajean Manes M.D.   On: 06/22/2016 11:42   Ct Renal Stone Study  Result Date: 06/20/2016 CLINICAL DATA:  Altered mental status with lethargy. Reported recent unknown kidney stone procedure. EXAM: CT ABDOMEN AND PELVIS  WITHOUT CONTRAST TECHNIQUE: Multidetector CT imaging of the abdomen and pelvis was performed following the standard protocol without IV contrast. COMPARISON:  CT 11/29/2015.  Limited abdominal ultrasound 05/26/2016 FINDINGS: Lower chest: Moderate to large right and small left pleural effusions are similar to the prior examination. There is no pericardial effusion. Patient is status post median sternotomy. There is atherosclerosis of the aorta and coronary arteries. Subtotal collapse of the right lower lobe and lesser atelectasis in the right middle and left lower lobes are similar to the prior study. Hepatobiliary: Morphologic changes of cirrhosis are again noted. No focal hepatic abnormalities are demonstrated on  noncontrast imaging. Prior cholecystectomy. The biliary system appears grossly unchanged. Pancreas: The pancreas is atrophied without apparent focal abnormality or surrounding inflammation. Spleen: Normal in size without focal abnormality. Adrenals/Urinary Tract: Stable 2.4 x 1.8 cm left adrenal nodule on image 32. The right adrenal gland appears normal. Nonobstructing small renal calculi on the right are unchanged. There is no right-sided hydronephrosis. There is a 14 mm calculus in the lower pole of the left kidney. There is new left-sided hydronephrosis and hydroureter with associated asymmetric perinephric soft tissue stranding. There are obstructing calculi in the distal left ureter, measuring up to 9 mm on image 86. There are additional smaller stone fragments in the left ureter, best seen on the reformatted images. There are also small right-sided bladder calculi in. The bladder remains decompressed by a Foley catheter. Stomach/Bowel: Descending colostomy noted. The colon is decompressed. No bowel wall thickening, significant distention or focal surrounding inflammatory change identified. No residual herniated bowel seen. Vascular/Lymphatic: There are several prominent retroperitoneal lymph nodes which are similar to the prior examination. There is diffuse aortic and branch vessel atherosclerosis. Reproductive: The uterus and adnexa appear unchanged. Other: There is a moderate amount of ascites which has increased in volume compared with the prior study. There are multiple hernias of the lower anterior abdominal wall containing fat and ascites. No residual herniated bowel. There is some soft tissue edema throughout the abdominal wall which is asymmetric to the right. Musculoskeletal: No acute or significant osseous findings. Old rib fractures are noted on the right. There are degenerative and postsurgical changes in the lumbar spine. IMPRESSION: 1. There are several obstructing calculi and stone fragments  within the distal left ureter with associated hydronephrosis. 2. Bilateral renal calculi and small bladder calculi are also noted. 3. Hepatic cirrhosis with increasing ascites. Right-greater-than-left pleural effusions have not significantly changed in volume. 4. Stable postsurgical changes status post descending colostomy. Multiple lower abdominal wall hernias are grossly unchanged. 5. Aortic atherosclerosis. 6. Stable left adrenal adenoma. Electronically Signed   By: Richardean Sale M.D.   On: 07/17/2016 13:11      Assessment & Plan: Pt is a 70 y.o. female with a PMHx of Coronary artery disease, diabetes mellitus type 2, GERD, hypertension, history of necrotizing fasciitis, history of colostomy placement, Nephrolithiasis with recent lithotripsy who was admitted to [facility] on 06/22/2016 for evaluation of Hypotension, confusion, and altered mental status.   1.  Acute renal failure due to ATN from septic shock N17.0:  Baseline creatinine 0.7.  Therefore she has more than doubled her creatinine and also is oliguric in the setting of septic shock and metabolic acidosis. Therefore we recommend a trial of continuous renal placement therapy. We discused risks, benefits, and alternatives with the patient's daughter. She agrees to proceed. We have requested pulmonary critical care to place a temporary dialysis catheter. We will thereafter start the patient on continuous  renal replacement therapy without ultrafiltration. Monitor electrolytes closely.  2. Septic shock with Escherichia coli. Escherichia coli noted in the blood. Urine culture still pending. Procalcitonin quite high. The patient had recent lithotripsy however now we noted obstruction. The patient is status post left ureteral stent placement by urology. Continue broad-spectrum anabolic spur pulmonary/critical care.  3. Metabolic acidosis. Most recent pH was 7.24. This should be treated with continuous renal placement therapy.  4. Acute respiratory  failure. Continue ventilatory support. FiO2 currently 50%.

## 2016-07-09 NOTE — Consult Note (Signed)
[facility] Vascular Consult Note  MRN : [id number]  Suzanne Ewing is a 70 y.o. (1946/01/13) female who presents with chief complaint of  Chief Complaint  Patient presents with  . Code Sepsis  .  History of Present Illness: I am asked by Dr. Stevenson Clinch from the critical care service to see the patient for dialysis access. He tried to place a dialysis catheter earlier today without success secondary to the patient's body habitus and volume overload, and he asked that we see the patient to place the catheter. Patient is admitted to the hospital with Sepsis and has developed acute renal failure and multisystem organ failure.  The patient can provide no history and is sedated on the ventilator. She is on pressors for hypotension.  The patient is critically ill with severe acidosis and multisystem organ failure. The nephrology service has decided to initiate dialysis at this time, and we are asked to place a temporary dialysis catheter for immediate dialysis use.    Current Facility-Administered Medications  Medication Dose Route Frequency Provider Last Rate Last Dose  . 0.9 %  sodium chloride infusion  250 mL Intravenous PRN Vishal Mungal, MD      . acetaminophen (TYLENOL) suppository 650 mg  650 mg Rectal Q4H PRN Mikael Spray, NP   650 mg at 07/09/16 0044  . acetaminophen (TYLENOL) tablet 650 mg  650 mg Oral Q4H PRN Vishal Mungal, MD      . antiseptic oral rinse solution (CORINZ)  7 mL Mouth Rinse Q2H Magadalene S Patria Mane, NP   7 mL at 07/09/16 1158  . budesonide (PULMICORT) nebulizer solution 0.5 mg  0.5 mg Nebulization BID Vishal Mungal, MD   0.5 mg at 07/09/16 0815  . chlorhexidine gluconate (SAGE KIT) (PERIDEX) 0.12 % solution 15 mL  15 mL Mouth Rinse BID Vishal Mungal, MD   15 mL at 07/09/16 0753  . famotidine (PEPCID) IVPB 20 mg premix  20 mg Intravenous Q24H Vishal Mungal, MD   20 mg at 07/14/2016 2130  . fentaNYL (SUBLIMAZE) injection 50 mcg  50 mcg Intravenous Q15  min PRN Vishal Mungal, MD   50 mcg at 07/06/2016 1553  . fentaNYL (SUBLIMAZE) injection 50 mcg  50 mcg Intravenous Q2H PRN Vishal Mungal, MD      . fentaNYL 2573mg in NS 2511m(1027mml) infusion-PREMIX  10 mcg/hr Intravenous Continuous Vishal Mungal, MD 30 mL/hr at 07/09/16 0934 300 mcg/hr at 07/09/16 0934  . heparin injection 1,000-6,000 Units  1,000-6,000 Units CRRT PRN Munsoor Lateef, MD      . heparin injection 5,000 Units  5,000 Units Subcutaneous Q8H Vishal Mungal, MD   5,000 Units at 07/09/16 0906  . hydrocortisone sodium succinate (SOLU-CORTEF) 100 MG injection 50 mg  50 mg Intravenous Q6H Vishal Mungal, MD   50 mg at 07/09/16 1156  . insulin aspart (novoLOG) injection 2-6 Units  2-6 Units Subcutaneous Q4H Vishal Mungal, MD      . ipratropium-albuterol (DUONEB) 0.5-2.5 (3) MG/3ML nebulizer solution 3 mL  3 mL Nebulization Q6H Vishal Mungal, MD   3 mL at 07/09/16 0759  . [START ON 8/208-28-17pratropium-albuterol (DUONEB) 0.5-2.5 (3) MG/3ML nebulizer solution 3 mL  3 mL Nebulization Q6H PRN Vishal Mungal, MD      . meropenem (MERREM) 1 g in sodium chloride 0.9 % 100 mL IVPB  1 g Intravenous Q12H Vishal Mungal, MD   1 g at 07/09/16 0906  . midazolam (VERSED) injection 1 mg  1 mg Intravenous Q2H  PRN Vilinda Boehringer, MD   1 mg at 07/09/16 0752  . norepinephrine (LEVOPHED) 16 mg in dextrose 5 % 250 mL (0.064 mg/mL) infusion  0-40 mcg/min Intravenous Titrated Vira Blanco, RPH 18.8 mL/hr at 07/09/16 1204 20 mcg/min at 07/09/16 1204  . ondansetron (ZOFRAN) injection 4 mg  4 mg Intravenous Q6H PRN Vishal Mungal, MD      . pureflow IV solution for Dialysis   CRRT Continuous Munsoor Lateef, MD      . sodium bicarbonate 150 mEq in sterile water 1,000 mL infusion   Intravenous Continuous Mikael Spray, NP 150 mL/hr at 07/09/16 [phone]    . [START ON 07/10/2016] vancomycin (VANCOCIN) 1,250 mg in sodium chloride 0.9 % 250 mL IVPB  1,250 mg Intravenous Q24H Vishal Mungal, MD      . vasopressin (PITRESSIN)  40 Units in sodium chloride 0.9 % 250 mL (0.16 Units/mL) infusion  0.01-0.04 Units/min Intravenous Continuous Vishal Mungal, MD 15 mL/hr at 07/09/16 1203 0.04 Units/min at 07/09/16 1203  . vecuronium (NORCURON) 10 MG injection             Past Medical History:  Diagnosis Date  . Anemia   . Anemia   . Anxiety   . Arthritis   . CAD (coronary artery disease)   . Candidiasis   . CHF (congestive heart failure) ([place])   . Chronic kidney disease   . Chronic pain   . COPD (chronic obstructive pulmonary disease) ([place])   . Coronary artery disease   . Depression   . Diabetes mellitus without complication ([place])   . Edema   . GERD (gastroesophageal reflux disease)   . GERD (gastroesophageal reflux disease)   . Heart disease   . Heel ulcer ([place])   . Hyperkalemia   . Hyperlipidemia   . Hypertension   . Long term current use of antibiotics   . Malnutrition ([place])   . MRSA (methicillin resistant Staphylococcus aureus)   . Myocardial infarction ([place])   . Necrotizing fasciitis ([place])   . Nephrolithiasis   . Osteoporosis   . Sacral pressure ulcer   . Seasonal allergies   . Shortness of breath   . Ulcers of both lower legs ([place])    Non-pressure related  . Upper respiratory infection   . Urinary retention   . UTI (lower urinary tract infection)     Past Surgical History:  Procedure Laterality Date  . BACK SURGERY    . CARPAL TUNNEL RELEASE Bilateral   . CHOLECYSTECTOMY    . CORONARY ARTERY BYPASS GRAFT    . JOINT REPLACEMENT    . KNEE SURGERY Left   . PERIPHERAL VASCULAR CATHETERIZATION Left 01/03/2016   Procedure: Lower Extremity Angiography;  Surgeon: Algernon Huxley, MD;  Location: [facility];  Service: Cardiovascular;  Laterality: Left;  . PERIPHERAL VASCULAR CATHETERIZATION  01/03/2016   Procedure: Lower Extremity Intervention;  Surgeon: Algernon Huxley, MD;  Location: [facility];  Service: Cardiovascular;;  . SHOULDER SURGERY      Social History Social History   Substance Use Topics  . Smoking status: Former Smoker    Packs/day: 0.50    Years: 20.00    Types: Cigarettes    Quit date: 06/03/2014  . Smokeless tobacco: Never Used  . Alcohol use No  husband and daughter at bedside today  Family History Family History  Problem Relation Age of Onset  . Stroke Mother   . Diabetes type II Sister   . Hypertension  Sister   unable to obtain further as patient can provide no history and this is what was in the chart  Allergies  Allergen Reactions  . Macrobid [Nitrofurantoin Monohyd Macro] Itching, Rash and Swelling    PT HOSPITALIZED  . Penicillins Shortness Of Breath, Rash and Other (See Comments)    Has patient had a PCN reaction causing immediate rash, facial/tongue/throat swelling, SOB or lightheadedness with hypotension: Yes Has patient had a PCN reaction causing severe rash involving mucus membranes or skin necrosis: No Has patient had a PCN reaction that required hospitalization Yes Has patient had a PCN reaction occurring within the last 10 years: No If all of the above answers are "NO", then may proceed with Cephalosporin use.   . Tape Rash  . Azithromycin Other (See Comments)    Reaction: unknown   . Clarithromycin Other (See Comments)    Abdominal pain  . Nitroglycerin Other (See Comments)    Reaction - unknown  . Leflunomide Rash  . Morphine Nausea And Vomiting    Nausea and vomiting  . Naproxen Rash     REVIEW OF SYSTEMS (Negative unless checked)  Unable to obtain the review of systems due to the patient's severe systemic illness and altered mental status.       Physical Examination  Vitals:   07/09/16 0900 07/09/16 1000 07/09/16 1100 07/09/16 1200  BP: (!) 120/54 (!) 80/51 (!) 103/53 (!) 90/45  Pulse: (!) 110 100 (!) 102 100  Resp: 17 13 16  (!) 0  Temp:      TempSrc:      SpO2: 100% 100% 100% 100%  Weight:      Height:       Body mass index is 33.45 kg/m. Gen: obese, critically ill appearing Head: [place]/AT, No  temporalis wasting Ear/Nose/Throat: trachea midline, nares w/o erythema or drainage,  Eyes: PERRLA, EOMI.  Neck: Supple, no nuchal rigidity.  No JVD.  Pulmonary:  Good air movement with course BS bilaterally on the vent  Cardiac: tachycardic  Gastrointestinal: ascites and a large hernia is present.  Ostomy present in LLQ. Unable to assess tenderness Musculoskeletal: difficult to assess strength. No deformity or atrophy. 2+ Edema in the lower extremities bilaterally Neurologic: sedated Psychiatric: Difficult to assess due to the severity of patient's illness. Dermatologic: No rashes or ulcers noted.   Lymph : No Cervical, Axillary, or Inguinal lymphadenopathy.      CBC Lab Results  Component Value Date   WBC 16.1 (H) 07/09/2016   HGB 7.9 (L) 07/09/2016   HCT 25.2 (L) 07/09/2016   MCV 78.3 (L) 07/09/2016   PLT 104 (L) 07/09/2016    BMET    Component Value Date/Time   NA 138 07/09/2016 0430   NA 140 05/31/2014 0358   K 4.3 07/09/2016 0430   K 4.0 05/31/2014 0358   CL 108 07/09/2016 0430   CL 104 05/31/2014 0358   CO2 20 (L) 07/09/2016 0430   CO2 29 05/31/2014 0358   GLUCOSE 97 07/09/2016 0430   GLUCOSE 166 (H) 05/31/2014 0358   BUN 43 (H) 07/09/2016 0430   BUN 21 (H) 05/31/2014 0358   CREATININE 1.73 (H) 07/09/2016 0430   CREATININE 0.90 06/04/2014 0730   CALCIUM 7.8 (L) 07/09/2016 0430   CALCIUM 8.6 05/31/2014 0358   GFRNONAA 29 (L) 07/09/2016 0430   GFRNONAA >60 06/04/2014 0730   GFRAA 33 (L) 07/09/2016 0430   GFRAA >60 06/04/2014 0730   Estimated Creatinine Clearance: 35 mL/min (by C-G formula based on  SCr of 1.73 mg/dL).  COAG Lab Results  Component Value Date   INR 1.30 12/02/2015    Radiology Dg Chest 1 View  Result Date: 06/30/2016 CLINICAL DATA:  Hypotensive.  Abdominal distension. EXAM: CHEST 1 VIEW COMPARISON:  12/06/2015 FINDINGS: Mildly degraded exam due to AP portable technique and patient body habitus. Prior median sternotomy. Numerous leads  and wires project over the chest. Midline trachea. Moderate cardiomegaly. Small to moderate right pleural effusion is increased. possible mild loculation laterally. No pneumothorax. Mild interstitial edema is asymmetric, worse on the right. Lower lobe predominant right-sided airspace disease. IMPRESSION: Cardiomegaly with mild interstitial edema. Increase in moderate right pleural effusion. Possible loculation laterally. Recommend attention on follow-up. Right base atelectasis or infection. Electronically Signed   By: Abigail Miyamoto M.D.   On: 06/20/2016 09:47   Dg Abd 1 View  Result Date: 07/09/2016 CLINICAL DATA:  Orogastric tube placement EXAM: ABDOMEN - 1 VIEW COMPARISON:  Earlier today FINDINGS: An orogastric tube tip and side-port overlaps the proximal stomach that is moderately distended. Left ureteral stent as noted previously. Stoma seen over the left abdomen. Known layering right pleural effusion. IMPRESSION: Orogastric tube tip over the proximal stomach which remains moderately distended by gas. Electronically Signed   By: Monte Fantasia M.D.   On: 07/09/2016 04:01   Dg Abd 1 View  Result Date: 07/09/2016 CLINICAL DATA:  Left ureteral stent placement.  Initial encounter. EXAM: ABDOMEN - 1 VIEW COMPARISON:  CT of the abdomen and pelvis performed 07/20/2016 FINDINGS: The visualized bowel gas pattern is unremarkable. Scattered air and stool filled loops of colon are seen; no abnormal dilatation of small bowel loops is seen to suggest small bowel obstruction. No free intra-abdominal air is identified, though evaluation for free air is limited on supine views. The stomach is diffusely distended with air, raising question for mild gastroparesis. A left ureteral stent is noted overlying expected position. The visualized osseous structures are within normal limits; the sacroiliac joints are unremarkable in appearance. Lumbar spinal fusion hardware is noted at L3-L5. Scattered vascular calcifications are  seen. IMPRESSION: 1. Left ureteral stent noted overlying expected position. 2. Stomach diffusely distended with air. Would correlate clinically to exclude mild gastroparesis. 3. Otherwise unremarkable bowel gas pattern; no free intra-abdominal air seen. 4. Scattered vascular calcifications seen. Electronically Signed   By: Garald Balding M.D.   On: 07/09/2016 02:59   Dg Chest Port 1 View  Result Date: 07/09/2016 CLINICAL DATA:  Postoperative radiograph, status post left ureteral stent placement. Initial encounter. EXAM: PORTABLE CHEST 1 VIEW COMPARISON:  Chest radiograph performed 07/16/2016 FINDINGS: The patient's endotracheal tube is seen ending 2-3 cm above the carina. A right IJ line is noted ending about the mid SVC. The lungs are hypoexpanded. A small right pleural effusion is noted, less prominent than on the prior study. Vascular crowding and vascular congestion are seen. Increased interstitial markings raise question for mild interstitial edema. The cardiomediastinal silhouette is borderline normal in size. The patient is status post median sternotomy, with evidence of prior CABG. There is chronic fracture of multiple sternal wires. No acute osseous abnormalities are seen. IMPRESSION: 1. Endotracheal tube seen ending 2-3 cm above the carina. 2. Lungs hypoexpanded. Small right pleural effusion is less prominent than on the prior study. Vascular congestion noted. Increased interstitial markings raise question for mild interstitial edema. Electronically Signed   By: Garald Balding M.D.   On: 07/09/2016 03:30   Dg Chest Port 1 View  Result Date: 07/06/2016 CLINICAL  DATA:  Status post intubation EXAM: PORTABLE CHEST 1 VIEW COMPARISON:  07/03/2016 FINDINGS: Cardiac shadow remains enlarged. Postsurgical changes are again seen. Endotracheal tube is now seen 2.9 cm above the carina in satisfactory position. A right jugular central line is again noted and stable. Increasing right-sided pleural effusion is  noted. There is likely some underlying atelectasis present IMPRESSION: Increasing right-sided pleural effusion. Status post intubation in satisfactory position. Electronically Signed   By: Inez Catalina M.D.   On: 07/06/2016 16:56   Dg Chest Portable 1 View  Result Date: 07/16/2016 CLINICAL DATA:  Status post central line placement. EXAM: PORTABLE CHEST 1 VIEW COMPARISON:  07/06/2016 at 9:11 a.m. FINDINGS: Right internal jugular central venous line catheter tip projects in the mid superior vena cava. No pneumothorax. Right-sided pleural effusion and mild interstitial thickening is stable. No new lung abnormalities. IMPRESSION: New right internal jugular central venous line tip projects in the mid superior vena cava. No pneumothorax. No other change from the earlier study. Electronically Signed   By: Lajean Manes M.D.   On: 07/04/2016 11:42   Ct Renal Stone Study  Result Date: 07/10/2016 CLINICAL DATA:  Altered mental status with lethargy. Reported recent unknown kidney stone procedure. EXAM: CT ABDOMEN AND PELVIS WITHOUT CONTRAST TECHNIQUE: Multidetector CT imaging of the abdomen and pelvis was performed following the standard protocol without IV contrast. COMPARISON:  CT 11/29/2015.  Limited abdominal ultrasound 05/26/2016 FINDINGS: Lower chest: Moderate to large right and small left pleural effusions are similar to the prior examination. There is no pericardial effusion. Patient is status post median sternotomy. There is atherosclerosis of the aorta and coronary arteries. Subtotal collapse of the right lower lobe and lesser atelectasis in the right middle and left lower lobes are similar to the prior study. Hepatobiliary: Morphologic changes of cirrhosis are again noted. No focal hepatic abnormalities are demonstrated on noncontrast imaging. Prior cholecystectomy. The biliary system appears grossly unchanged. Pancreas: The pancreas is atrophied without apparent focal abnormality or surrounding  inflammation. Spleen: Normal in size without focal abnormality. Adrenals/Urinary Tract: Stable 2.4 x 1.8 cm left adrenal nodule on image 32. The right adrenal gland appears normal. Nonobstructing small renal calculi on the right are unchanged. There is no right-sided hydronephrosis. There is a 14 mm calculus in the lower pole of the left kidney. There is new left-sided hydronephrosis and hydroureter with associated asymmetric perinephric soft tissue stranding. There are obstructing calculi in the distal left ureter, measuring up to 9 mm on image 86. There are additional smaller stone fragments in the left ureter, best seen on the reformatted images. There are also small right-sided bladder calculi in. The bladder remains decompressed by a Foley catheter. Stomach/Bowel: Descending colostomy noted. The colon is decompressed. No bowel wall thickening, significant distention or focal surrounding inflammatory change identified. No residual herniated bowel seen. Vascular/Lymphatic: There are several prominent retroperitoneal lymph nodes which are similar to the prior examination. There is diffuse aortic and branch vessel atherosclerosis. Reproductive: The uterus and adnexa appear unchanged. Other: There is a moderate amount of ascites which has increased in volume compared with the prior study. There are multiple hernias of the lower anterior abdominal wall containing fat and ascites. No residual herniated bowel. There is some soft tissue edema throughout the abdominal wall which is asymmetric to the right. Musculoskeletal: No acute or significant osseous findings. Old rib fractures are noted on the right. There are degenerative and postsurgical changes in the lumbar spine. IMPRESSION: 1. There are several obstructing calculi and  stone fragments within the distal left ureter with associated hydronephrosis. 2. Bilateral renal calculi and small bladder calculi are also noted. 3. Hepatic cirrhosis with increasing ascites.  Right-greater-than-left pleural effusions have not significantly changed in volume. 4. Stable postsurgical changes status post descending colostomy. Multiple lower abdominal wall hernias are grossly unchanged. 5. Aortic atherosclerosis. 6. Stable left adrenal adenoma. Electronically Signed   By: Richardean Sale M.D.   On: 07/06/2016 13:11      Assessment/Plan 1. ARF. Needs Vascath and primary service unsuccessful.  We are asked to place this today 2. Sepsis. Now appears to have MSOF. Prognosis is poor 3. Metabolic acidosis.  CRRT should help correct that   We will proceed with temporary dialysis catheter placement at this time.  Risks and benefits discussed with patient and/or family, and the catheter will be placed to allow immediate initiation of dialysis.  If the patient's renal function does not improve throughout the hospital course and they go on to survive this, we will be happy to place a tunneled dialysis catheter for long term use prior to discharge.     DEW,JASON, MD  07/09/2016 12:41 PM

## 2016-07-09 NOTE — Progress Notes (Signed)
Patient started on CRRT- tolerating, left femoral catheter placed per Dr Lucky Cowboy. Titrated pressors as patients BP tolerates. Minimal urine output noted refer to I/O flow sheet. Family updated throughput the day.

## 2016-07-09 NOTE — Progress Notes (Signed)
PULMONARY / CRITICAL CARE MEDICINE   Name: Suzanne Ewing MRN: [id number] DOB: 09/10/1946    ADMISSION DATE:  07/14/2016  CHIEF COMPLAINT:  Septic shock s/p left ureteral stent placement  HISTORY OF PRESENT ILLNESS:   70 yo young past medical history of recurrent UTI, COPD, chronic disease, coronary artery disease, diabetes, congestive heart failure, recent left-sided lithotripsy for left ureteral calculi and bladder calculi, presents to the ER with hypotension, confusion, and altered mental status. Patient is critically ill and confused, unable to provide a appropriate history, history from chart. Per chart review patient was found small into be hypotensive with a pressure in the 60s over 40s. She is normally awake and alert, able to move around in a motor scooter, both found to be somnolent and minimally responsive and arousable. In route, too large for IVs were started and she was given a 250 mL bolus of fluids with minimal response and pressure. Family gave a history that patient had lithotripsy about 1.5 weeks ago and then started complaining of inability to void completely this week. She had a urinalysis that showed nitrite positive organisms at a previous urology visit, then yesterday had another urinalysis done at urology follow-up, results are still pending.  In the ER she was given 3 L of normal saline, started on norepinephrine 10 g, blood pressure still in the 70s and 80s. CCM was called for admission.  Will review chart shows the patient is a resident at [facility], she is in follow-up with urology on 8/15 noted to have 3+ nitrites and a urinalysis, on 8/17 urology ordered a catheterized urine culture sample, this is yet to be resulted. ED physician placed RIJ CVL  SUBJECTIVE:  Remains in shock on two pressors. MAPs, metabolic and lactic acidosis improving. Oliguric with very small purulent drainage from foley. Foley changed and irrigated with no improvement in urine output.   VITAL  SIGNS: BP (!) 100/58   Pulse (!) 122   Temp (!) 102 F (38.9 C)   Resp 10   Ht 5\' 6"  (1.676 m)   Wt 198 lb 13.7 oz (90.2 kg)   SpO2 100%   BMI 32.10 kg/m   HEMODYNAMICS: CVP:  [16 mmHg] 16 mmHg  VENTILATOR SETTINGS: Vent Mode: PRVC FiO2 (%):  [30 %-50 %] 50 % Set Rate:  [15 bmp-30 bmp] 30 bmp Vt Set:  [400 mL] 400 mL PEEP:  [5 cmH20] 5 cmH20  INTAKE / OUTPUT: I/O last 3 completed shifts: In: 1227.3 [I.V.:1227.3] Out: 225 [Urine:225]  PHYSICAL EXAMINATION: Constitutional: She is well-developed, well-nourished, and appears acutely ill.  HEENT: Normocephalic and atraumatic; conjunctivae and EOM are normal. Pupils are equal, round, and reactive to light. Neck with normal range of motion and supple.  Pulmonary/Chest: Normal WOB, bilateral airflow but decreased breath sounds in the bases Abdominal: Distended/obese, lower abdominal quadrant palpable mass, no tenderness. L sided colostomy bag  Musculoskeletal: Normal range of motion; no visible deformities.  Neurological: Sedated, awakens to noxious stimulus and name, moves all extremities Extremities: Feet cyanotic, +1 pulses in BLLE and +2 in upper extremities Skin: Warm to touch, Bilateral heel ulcers (R>L) - well healed with protective dressing Nursing note and vitals reviewed.  LABS:  BMET  Recent Labs Lab 07/04/2016 0918 07/07/2016 1822  NA 141 138  K 4.1 4.1  CL 113* 114*  CO2 18* 19*  BUN 45* 43*  CREATININE 1.50* 1.53*  GLUCOSE 65 111*    Electrolytes  Recent Labs Lab 06/20/2016 0918 06/20/2016 1822  CALCIUM 9.5 8.0*  MG  --  1.3*  PHOS  --  3.4    CBC  Recent Labs Lab 07/17/2016 0918 06/23/2016 1822  WBC 5.9 19.9*  HGB 9.1* 8.0*  HCT 29.0* 26.6*  PLT 143* 138*    Coag's No results for input(s): APTT, INR in the last 168 hours.  Sepsis Markers  Recent Labs Lab 07/02/2016 0918 07/12/2016 1408  LATICACIDVEN 5.1* 4.4*  PROCALCITON 50.01  --     ABG  Recent Labs Lab 07/04/2016 1713  06/20/2016 2013 07/07/2016 2242  PHART 7.10* 7.15* 7.24*  PCO2ART 49* 43 42  PO2ART 62* 59* 86    Liver Enzymes  Recent Labs Lab 06/25/2016 0918 06/23/2016 1822  AST 76* 123*  ALT 46 77*  ALKPHOS 92 77  BILITOT 0.5 0.5  ALBUMIN 2.3* 2.0*    Cardiac Enzymes  Recent Labs Lab 07/05/2016 0918  TROPONINI 0.06*    Glucose  Recent Labs Lab 07/04/2016 1353 06/21/2016 1545 07/06/2016 1931 07/18/2016 2016 07/09/16 0008 07/09/16 0337  GLUCAP 74 80 66 93 90 83    Imaging Dg Chest 1 View  Result Date: 07/04/2016 CLINICAL DATA:  Hypotensive.  Abdominal distension. EXAM: CHEST 1 VIEW COMPARISON:  12/06/2015 FINDINGS: Mildly degraded exam due to AP portable technique and patient body habitus. Prior median sternotomy. Numerous leads and wires project over the chest. Midline trachea. Moderate cardiomegaly. Small to moderate right pleural effusion is increased. possible mild loculation laterally. No pneumothorax. Mild interstitial edema is asymmetric, worse on the right. Lower lobe predominant right-sided airspace disease. IMPRESSION: Cardiomegaly with mild interstitial edema. Increase in moderate right pleural effusion. Possible loculation laterally. Recommend attention on follow-up. Right base atelectasis or infection. Electronically Signed   By: Abigail Miyamoto M.D.   On: 06/22/2016 09:47   Dg Abd 1 View  Result Date: 07/09/2016 CLINICAL DATA:  Left ureteral stent placement.  Initial encounter. EXAM: ABDOMEN - 1 VIEW COMPARISON:  CT of the abdomen and pelvis performed 07/07/2016 FINDINGS: The visualized bowel gas pattern is unremarkable. Scattered air and stool filled loops of colon are seen; no abnormal dilatation of small bowel loops is seen to suggest small bowel obstruction. No free intra-abdominal air is identified, though evaluation for free air is limited on supine views. The stomach is diffusely distended with air, raising question for mild gastroparesis. A left ureteral stent is noted overlying  expected position. The visualized osseous structures are within normal limits; the sacroiliac joints are unremarkable in appearance. Lumbar spinal fusion hardware is noted at L3-L5. Scattered vascular calcifications are seen. IMPRESSION: 1. Left ureteral stent noted overlying expected position. 2. Stomach diffusely distended with air. Would correlate clinically to exclude mild gastroparesis. 3. Otherwise unremarkable bowel gas pattern; no free intra-abdominal air seen. 4. Scattered vascular calcifications seen. Electronically Signed   By: Garald Balding M.D.   On: 07/09/2016 02:59   Dg Chest Port 1 View  Result Date: 07/09/2016 CLINICAL DATA:  Postoperative radiograph, status post left ureteral stent placement. Initial encounter. EXAM: PORTABLE CHEST 1 VIEW COMPARISON:  Chest radiograph performed 07/10/2016 FINDINGS: The patient's endotracheal tube is seen ending 2-3 cm above the carina. A right IJ line is noted ending about the mid SVC. The lungs are hypoexpanded. A small right pleural effusion is noted, less prominent than on the prior study. Vascular crowding and vascular congestion are seen. Increased interstitial markings raise question for mild interstitial edema. The cardiomediastinal silhouette is borderline normal in size. The patient is status post median sternotomy, with evidence  of prior CABG. There is chronic fracture of multiple sternal wires. No acute osseous abnormalities are seen. IMPRESSION: 1. Endotracheal tube seen ending 2-3 cm above the carina. 2. Lungs hypoexpanded. Small right pleural effusion is less prominent than on the prior study. Vascular congestion noted. Increased interstitial markings raise question for mild interstitial edema. Electronically Signed   By: Garald Balding M.D.   On: 07/09/2016 03:30   Dg Chest Port 1 View  Result Date: 06/30/2016 CLINICAL DATA:  Status post intubation EXAM: PORTABLE CHEST 1 VIEW COMPARISON:  06/23/2016 FINDINGS: Cardiac shadow remains enlarged.  Postsurgical changes are again seen. Endotracheal tube is now seen 2.9 cm above the carina in satisfactory position. A right jugular central line is again noted and stable. Increasing right-sided pleural effusion is noted. There is likely some underlying atelectasis present IMPRESSION: Increasing right-sided pleural effusion. Status post intubation in satisfactory position. Electronically Signed   By: Inez Catalina M.D.   On: 06/21/2016 16:56   Dg Chest Portable 1 View  Result Date: 07/01/2016 CLINICAL DATA:  Status post central line placement. EXAM: PORTABLE CHEST 1 VIEW COMPARISON:  07/15/2016 at 9:11 a.m. FINDINGS: Right internal jugular central venous line catheter tip projects in the mid superior vena cava. No pneumothorax. Right-sided pleural effusion and mild interstitial thickening is stable. No new lung abnormalities. IMPRESSION: New right internal jugular central venous line tip projects in the mid superior vena cava. No pneumothorax. No other change from the earlier study. Electronically Signed   By: Lajean Manes M.D.   On: 07/13/2016 11:42   Ct Renal Stone Study  Result Date: 06/20/2016 CLINICAL DATA:  Altered mental status with lethargy. Reported recent unknown kidney stone procedure. EXAM: CT ABDOMEN AND PELVIS WITHOUT CONTRAST TECHNIQUE: Multidetector CT imaging of the abdomen and pelvis was performed following the standard protocol without IV contrast. COMPARISON:  CT 11/29/2015.  Limited abdominal ultrasound 05/26/2016 FINDINGS: Lower chest: Moderate to large right and small left pleural effusions are similar to the prior examination. There is no pericardial effusion. Patient is status post median sternotomy. There is atherosclerosis of the aorta and coronary arteries. Subtotal collapse of the right lower lobe and lesser atelectasis in the right middle and left lower lobes are similar to the prior study. Hepatobiliary: Morphologic changes of cirrhosis are again noted. No focal hepatic  abnormalities are demonstrated on noncontrast imaging. Prior cholecystectomy. The biliary system appears grossly unchanged. Pancreas: The pancreas is atrophied without apparent focal abnormality or surrounding inflammation. Spleen: Normal in size without focal abnormality. Adrenals/Urinary Tract: Stable 2.4 x 1.8 cm left adrenal nodule on image 32. The right adrenal gland appears normal. Nonobstructing small renal calculi on the right are unchanged. There is no right-sided hydronephrosis. There is a 14 mm calculus in the lower pole of the left kidney. There is new left-sided hydronephrosis and hydroureter with associated asymmetric perinephric soft tissue stranding. There are obstructing calculi in the distal left ureter, measuring up to 9 mm on image 86. There are additional smaller stone fragments in the left ureter, best seen on the reformatted images. There are also small right-sided bladder calculi in. The bladder remains decompressed by a Foley catheter. Stomach/Bowel: Descending colostomy noted. The colon is decompressed. No bowel wall thickening, significant distention or focal surrounding inflammatory change identified. No residual herniated bowel seen. Vascular/Lymphatic: There are several prominent retroperitoneal lymph nodes which are similar to the prior examination. There is diffuse aortic and branch vessel atherosclerosis. Reproductive: The uterus and adnexa appear unchanged. Other: There is a  moderate amount of ascites which has increased in volume compared with the prior study. There are multiple hernias of the lower anterior abdominal wall containing fat and ascites. No residual herniated bowel. There is some soft tissue edema throughout the abdominal wall which is asymmetric to the right. Musculoskeletal: No acute or significant osseous findings. Old rib fractures are noted on the right. There are degenerative and postsurgical changes in the lumbar spine. IMPRESSION: 1. There are several  obstructing calculi and stone fragments within the distal left ureter with associated hydronephrosis. 2. Bilateral renal calculi and small bladder calculi are also noted. 3. Hepatic cirrhosis with increasing ascites. Right-greater-than-left pleural effusions have not significantly changed in volume. 4. Stable postsurgical changes status post descending colostomy. Multiple lower abdominal wall hernias are grossly unchanged. 5. Aortic atherosclerosis. 6. Stable left adrenal adenoma. Electronically Signed   By: Richardean Sale M.D.   On: 07/10/2016 13:11    STUDIES:  Cystoscopy 08/19>left ureteral stent placement  CULTURES: Blood x2 08/08/16> GNR>E.Coli and Staph Species Urine 07/16/2016>  ANTIBIOTICS: Vancomycin 08/19> Meropenem 08/19>  SIGNIFICANT EVENTS: 8/8> left ureteral calculi, bladder calculi, cystoscopy with uroscopy, lithotripsy and indwelling stent inserted on the left, removal of bladder stone, less than 2.5 cm, lithotripsy including insertion of indwelling urethral stent, crush calculus and removal of fragments. 8/15> urology follow-up, complaining of voiding issues, UA with 3+ nitrites 8/17> family states patient incompletely able to void, urology ordered catheterized urine culture 8/19>septic shock, urgent L ureteral stent, 20cc of cloud fluid removed from L ureter by urology, worsening shock, ETT from OR, bicarb gtt 8/20>GNR bacteremia, septic shock, remains ETT,   LINES/TUBES: Right IJ 8/19>  DISCUSSION: 70 yo F with recent left-sided lithotripsy, history of recurrent UTI, diabetes, CAD, hyperlipidemia, now with septic shock-source UTI, severe metabolic\lactic acidosis, E.coli bacteremia  ASSESSMENT / PLAN:  PULMONARY Acute hypoxemic respiratory failure Severe metabolic acidosis Right-sided loculated pleural effusion-chronic; last thoracentesis 11/30/2015, no growth.  H/O COPD P: Full vent support with current settings Maintain O2 sat>88% WA when stable Pulmicort  nebs BID PRN Duonebs Will consider repeat thoracentesis if effusions worsen and sepsis persists Repeat ABG Daily CXR prn  Cardiovascular A: Septic shock-Septic shock mostly likely due to urinary source Hx of Hypertension Afib-ECHO 11/2015>EF 40-45%, LVH, sys CHF, mild MR,  mild LA dilation History of coronary artery disease Peripheral vascular disease P: Continue vasopressin and levophed to keep MAP 65 and above [place] pressors as tolerated Hold beta blockers for now given hypotension Hold lasix, diltiazem and all home BP meds in light of shock  RENAL Acute Left hydronephrosis - seen intraoperatively (See urology note) Hx of left Renal calculi status post left-sided lithotripsy with ureteral stent placement Oliguria s/p left ureteral stent placement with worsening creatinine Recurrent UTI History of chronic indwelling catheter Acute renal failure Metabolic Acidosis/Lactic Acidosis-Lactic acid trending down P: Monitor BMP, and Cr Avoid nephrotoxic drugs Fluid resuscitation Irrigate foley prn Nephrology consulted; awaiting input  Trend lactic acid Sodium bicarb gtt Per Urology> recommend f/u urine output, Iv hydration and treat acidosis Given low UOP and metabolic acidosis, nephrology consult  GI A: H/O GERD S/p L colostomy Bag placement P: Cont with GI prophylaxis Colostomy care  INFECTIOUS DISEASE Septic shock Recurrent UTI P: F/U cultures Follow-up pro-calcitonin Abx as stated above  ENDOCRINE A: Type 2 DM P: ICU hypo/hyperglycemia protocol SSI  NEURO AMS Acute metabolic Encephalopathy related to underlying infection  A: Avoid sedative drugs if possible Cont to monitor neuro status.  Continue current treatment for  acidosis and sepsis  Disposition and family update: No family at bedside. Will update when available.  [facility] ANP-BC Pulmonary and [place] Pager [phone] or  [phone]   07/09/2016, 3:39 AM  STAFF NOTE: I, Dr. Vilinda Boehringer have personally reviewed patient's available data, including medical history, events of note, physical examination and test results as part of my evaluation. I have discussed with NP Demaris Callander  and other care providers such as pharmacist, RN and RRT.    S:overnight with worsening shock, requiring addition vasopressin, bicarb pushes and gtt. Still with agitation and reaching for ETT  A: Septic Shock - source urine Bacteremia - GNR Acute hypoxic respiratory failure Hypomagnesemia Afib Nephrolithasis Left hydronephrosis  P:  - cont with pressors, maintain MAP >65 - cont with Abx - meropenem and vancomycin - still with low UOP, nephrology consult, possible crrt - agitation - increased fentanyl gtt   .  Rest per NP/medical resident whose note is outlined above and that I agree with  The patient is critically ill with multiple organ systems failure and requires high complexity decision making for assessment and support, frequent evaluation and titration of therapies, application of advanced monitoring technologies and extensive interpretation of multiple databases.   Critical Care Time devoted to patient care services described in this note is  45 Minutes.   This time reflects time of care of this signee Dr Vilinda Boehringer.  This critical care time does not reflect procedure time, or teaching time or supervisory time of PA/NP/Med-student/Med Resident etc but could involve care discussion time.  Vilinda Boehringer, MD [place] [facility] Pager [phone] (please enter 7-digits) On Call Pager [phone] (please enter 7-digits)  Note: This note was prepared with Dragon dictation along with smaller phrase technology. Any transcriptional errors that result from this process are unintentional.

## 2016-07-09 NOTE — NC FL2 (Signed)
[place] LEVEL OF CARE SCREENING TOOL     IDENTIFICATION  Patient Name: Suzanne Ewing Birthdate: 01-13-1946 Sex: female Admission Date (Current Location): 07/13/2016  [place] and [place] Number:  Engineering geologist and Address:  [facility], [address] [place], [place] [address]      Provider Number: [id number]  Attending Physician Name and Address:  Laverle Hobby, MD  Relative Name and Phone Number:       Current Level of Care: Hospital Recommended Level of Care: [place] Prior Approval Number:    Date Approved/Denied: 05/08/14 PASRR Number: [id number]  Discharge Plan: SNF    Current Diagnoses: Patient Active Problem List   Diagnosis Date Noted  . Sepsis ([place]) 07/02/2016  . Pressure ulcer 07/19/2016  . Proteus (mirabilis) (morganii) as the cause of diseases classified elsewhere 12/06/2015  . Infection due to ESBL-producing Escherichia coli 12/06/2015  . Atherosclerotic peripheral vascular disease ([place]) 12/06/2015  . Ulcer of left heel ([place]) 12/06/2015  . Bladder stones 12/06/2015  . Pleural effusion, right 12/06/2015  . S/P thoracentesis 12/06/2015  . Liver cirrhosis ([place]) 12/06/2015  . Ascites 12/06/2015  . Hyponatremia 12/06/2015  . Encephalopathy, metabolic 94/17/4081  . Acute respiratory failure with hypoxia and hypercapnia ([place]) 12/06/2015  . Acute on chronic systolic CHF (congestive heart failure) ([place]) 12/06/2015  . Dysphagia 12/06/2015  . Failure to thrive in adult 12/06/2015  . Erosion of intestine 12/06/2015  . 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 Dyspnea   . Hepatic cirrhosis ([place])   . Acute pyelonephritis 11/30/2015  . Sepsis secondary to UTI ([place]) 06/12/2015  . Cellulitis and abscess 06/12/2015  . Acute renal failure ([place]) 06/12/2015  . Hyperkalemia 06/12/2015  . Anemia 06/12/2015  . CAD (coronary artery disease) 06/12/2015  . DM (diabetes  mellitus) ([place]) 06/12/2015  . HTN (hypertension) 06/12/2015  . Rheumatoid arthritis ([place]) 06/12/2015  . GERD (gastroesophageal reflux disease) 06/12/2015  . DM2 (diabetes mellitus, type 2) ([place]) 06/12/2015    Orientation RESPIRATION BLADDER Height & Weight        Vent Continent Weight: 207 lb 3.7 oz (94 kg) Height:  _0  (167.6 cm)  BEHAVIORAL SYMPTOMS/MOOD NEUROLOGICAL BOWEL NUTRITION STATUS      Colostomy    AMBULATORY STATUS COMMUNICATION OF NEEDS Skin   Total Care Verbally Skin abrasions (Patient has sacral wound and foot wounds from prior to hospital admission.)                       Personal Care Assistance Level of Assistance  Total care       Total Care Assistance: Maximum assistance   Functional Limitations Info             SPECIAL CARE FACTORS FREQUENCY                       Contractures Contractures Info: Present    Additional Factors Info  Allergies   Allergies Info:  (Macrobid, penicillins, Azithromycin, CLARITHROMYCIN, morphine, Nitroglycerin, Leflunomide, naproxen)           Current Medications (07/09/2016):  This is the current hospital active medication list Current Facility-Administered Medications  Medication Dose Route Frequency Provider Last Rate Last Dose  . 0.9 %  sodium chloride infusion  250 mL Intravenous PRN Vishal Mungal, MD      . acetaminophen (TYLENOL) suppository 650 mg  650 mg Rectal Q4H PRN Mikael Spray, NP  650 mg at 07/09/16 0044  . acetaminophen (TYLENOL) tablet 650 mg  650 mg Oral Q4H PRN Vishal Mungal, MD      . antiseptic oral rinse solution (CORINZ)  7 mL Mouth Rinse Q2H Magadalene S Patria Mane, NP   7 mL at 07/09/16 1158  . budesonide (PULMICORT) nebulizer solution 0.5 mg  0.5 mg Nebulization BID Vishal Mungal, MD   0.5 mg at 07/09/16 0815  . chlorhexidine gluconate (SAGE KIT) (PERIDEX) 0.12 % solution 15 mL  15 mL Mouth Rinse BID Vishal Mungal, MD   15 mL at 07/09/16 0753  . famotidine (PEPCID) IVPB 20 mg  premix  20 mg Intravenous Q24H Vishal Mungal, MD   20 mg at 07/14/2016 2130  . fentaNYL (SUBLIMAZE) injection 50 mcg  50 mcg Intravenous Q15 min PRN Vishal Mungal, MD   50 mcg at 06/21/2016 1553  . fentaNYL (SUBLIMAZE) injection 50 mcg  50 mcg Intravenous Q2H PRN Vishal Mungal, MD      . fentaNYL 258mg in NS 2586m(1022mml) infusion-PREMIX  10 mcg/hr Intravenous Continuous Vishal Mungal, MD 30 mL/hr at 07/09/16 0934 300 mcg/hr at 07/09/16 0934  . heparin injection 1,000-6,000 Units  1,000-6,000 Units CRRT PRN Munsoor Lateef, MD      . heparin injection 5,000 Units  5,000 Units Subcutaneous Q8H PraLaverle HobbyD      . hydrocortisone sodium succinate (SOLU-CORTEF) 100 MG injection 50 mg  50 mg Intravenous Q6H Vishal Mungal, MD   50 mg at 07/09/16 1156  . insulin aspart (novoLOG) injection 2-6 Units  2-6 Units Subcutaneous Q4H Vishal Mungal, MD      . ipratropium-albuterol (DUONEB) 0.5-2.5 (3) MG/3ML nebulizer solution 3 mL  3 mL Nebulization Q6H Vishal Mungal, MD   3 mL at 07/09/16 1303  . [START ON 8/209/15/2017pratropium-albuterol (DUONEB) 0.5-2.5 (3) MG/3ML nebulizer solution 3 mL  3 mL Nebulization Q6H PRN Vishal Mungal, MD      . meropenem (MERREM) 1 g in sodium chloride 0.9 % 100 mL IVPB  1 g Intravenous Q12H Vishal Mungal, MD   1 g at 07/09/16 0906  . midazolam (VERSED) injection 1 mg  1 mg Intravenous Q2H PRN VisVilinda BoehringerD   1 mg at 07/09/16 0752  . norepinephrine (LEVOPHED) 16 mg in dextrose 5 % 250 mL (0.064 mg/mL) infusion  0-40 mcg/min Intravenous Titrated LisVira BlancoPH 18.8 mL/hr at 07/09/16 1204 20 mcg/min at 07/09/16 1204  . ondansetron (ZOFRAN) injection 4 mg  4 mg Intravenous Q6H PRN Vishal Mungal, MD      . pureflow IV solution for Dialysis   CRRT Continuous Munsoor Lateef, MD      . sodium bicarbonate 150 mEq in sterile water 1,000 mL infusion   Intravenous Continuous MagMikael SprayP 150 mL/hr at 07/09/16 [id number] . [START ON 07/10/2016] vancomycin (VANCOCIN)  1,250 mg in sodium chloride 0.9 % 250 mL IVPB  1,250 mg Intravenous Q24H Vishal Mungal, MD      . vasopressin (PITRESSIN) 40 Units in sodium chloride 0.9 % 250 mL (0.16 Units/mL) infusion  0.01-0.04 Units/min Intravenous Continuous Vishal Mungal, MD 15 mL/hr at 07/09/16 1203 0.04 Units/min at 07/09/16 1203  . vecuronium (NORCURON) 10 MG injection              Discharge Medications: Please see discharge summary for a list of discharge medications.  Relevant Imaging Results:  Relevant Lab Results:   Additional Information  SS#[phone] [id number]

## 2016-07-09 NOTE — Progress Notes (Signed)
eLink Physician-Brief Progress Note Patient Name: Suzanne Ewing DOB: August 16, 1946 MRN: [id number]   Date of Service  07/09/2016  HPI/Events of Note  Camera check on patient with acute respiratory failure and ongoing septic shock. Currently on Levophed and vasopressin infusions. Appears comfortable. Nursing staff at bedside.   eICU Interventions  1. Rapid taper of vasopressin to off 2. Consolidating to single agent vasopressor support 3. Continuing current plan of care and ICU monitoring      Intervention Category Major Interventions: Shock - evaluation and management;Respiratory failure - evaluation and management  Tera Partridge 07/09/2016, 8:13 PM

## 2016-07-09 NOTE — Procedures (Signed)
Attempted Left Femoral Vascath placement - unsuccessful Indication - septic shock, renal failure, need for CRRT  Patient was placed in the appropriate position, supine/flat, left femoral vein area was cleaned with chlorhexidine, left femoral vein was identified under ultrasound. Sterile techniques employed. I attempted to place a left femoral Vas-Cath under ultrasound gu unsuccessful attempt at puncturing the vessel and threading wire. After different attempts and repositioning, I will still unsuccessfull at puncturing the left femoral vein. Procedure aborted, vascular was consulted (Dr. Lucky Cowboy), who agreed to attempt Vas-Cath placement.  Critical Care time - 67mins  Vilinda Boehringer, MD [facility] Pager [phone] (please enter 7-digits) On Call Pager - [phone] (please enter 7-digits)

## 2016-07-09 NOTE — Clinical Social Work Note (Signed)
Clinical Social Work Assessment  Patient Details  Name: Suzanne Ewing MRN: [id number] Date of Birth: 1946/07/16  Date of referral:  07/09/16               Reason for consult:  Facility Placement                Permission sought to share information with:  Facility Sport and exercise psychologist Permission granted to share information::  Yes, Verbal Permission Granted  Name::        Agency::  [facility]  Relationship::     Contact Information:  [phone]  Housing/Transportation Living arrangements for the past 2 months:  [place] of Information:  Adult Children Patient Interpreter Needed:  None Criminal Activity/Legal Involvement Pertinent to Current Situation/Hospitalization:  No - Comment as needed Significant Relationships:  Adult Children, Spouse Lives with:  Facility Resident Do you feel safe going back to the place where you live?  Yes Need for family participation in patient care:  Yes (Comment)  Care giving concerns:  Admitted from facility ([facility])   Social Worker assessment / plan:  Patient is ventilated. Patient's daughters and spouse were at bedside.  Patient's daughter, Suzanne Ewing, is the [facility] and has provided documentation to [facility]. Suzanne Ewing would like her mother to return to [facility] if possible pending stability. Patient at baseline is able to use a wheelchair at times and is able to feed herself. Patient requires some assistance with bathing and dressing. Patient has a colostomy and uses a bed pan for urination. Patient has foot wounds and a sacral wound from prior to [facility] admission.   Currently, dc TBD pending medical stability. Patient beginning dialysis via perm cath today at bedside. Further dialysis needs unknown at this time.   Employment status:  Retired Forensic scientist:  Information systems manager, Medicaid In [place] PT Recommendations:  Not assessed at this time Information / Referral to community resources:     Patient/Family's Response to care:   Family appreciative of CSW and involved in patient's care.  Patient/Family's Understanding of and Emotional Response to Diagnosis, Current Treatment, and Prognosis:  Patient's family able to verbalize in their own terms the role of the CSW and the current treatment goals.  Emotional Assessment Appearance:  Appears stated age Attitude/Demeanor/Rapport:   (Patient on vent/Family pleasant) Affect (typically observed):   (Patient on vent/Family appropriately apprehensive) Orientation:   (Patient on vent.) Alcohol / Substance use:  Never Used Psych involvement (Current and /or in the community):  No (Comment)  Discharge Needs  Concerns to be addressed:  Discharge Planning Concerns Readmission within the last 30 days:  No Current discharge risk:  Chronically ill Barriers to Discharge:  Continued Medical Work up   [employer], LCSW 07/09/2016, 1:51 PM

## 2016-07-09 NOTE — Progress Notes (Signed)
eLink Physician-Brief Progress Note Patient Name: Suzanne Ewing DOB: January 14, 1946 MRN: [id number]   Date of Service  07/09/2016  HPI/Events of Note  70 year old female with sepsis secondary to acute pyelonephritis. Currently with significant metabolic/lactic acidosis. pH improved to 7.24 from 7.15. PCO2 42. Currently on bicarbonate drip at 200 mL per hour. Currently on Levophed and vasopressin infusions for blood pressure support.   eICU Interventions  1. Continue bicarbonate drip 2. Continue vasopressor support 3. Trending lactic acid  4. Repeat ABG in a.m.      Intervention Category Major Interventions: Acid-Base disturbance - evaluation and management;Shock - evaluation and management;Sepsis - evaluation and management  Tera Partridge 07/09/2016, 12:12 AM

## 2016-07-09 NOTE — Progress Notes (Signed)
PHARMACY - PHYSICIAN COMMUNICATION CRITICAL VALUE ALERT - BLOOD CULTURE IDENTIFICATION (BCID)  Blood Cx growing E Coli, KPC not detected.  No results found for this or any previous visit.  Name of physician (or Provider) Contacted: Dr Ara Kussmaul  Changes to prescribed antibiotics required: Pt on Meropenem 1 gm IV Q12H .  No change in treatment needed.   Anna Livers D 07/09/2016  12:29 AM

## 2016-07-09 NOTE — Progress Notes (Signed)
PHARMACY - PHYSICIAN COMMUNICATION CRITICAL VALUE ALERT - BLOOD CULTURE IDENTIFICATION (BCID)  Results for orders placed or performed during the hospital encounter of 06/27/2016  Blood Culture ID Panel (Reflexed) (Collected: 06/29/2016  9:18 AM)  Result Value Ref Range   Enterococcus species NOT DETECTED NOT DETECTED   Vancomycin resistance NOT DETECTED NOT DETECTED   Listeria monocytogenes NOT DETECTED NOT DETECTED   Staphylococcus species NOT DETECTED NOT DETECTED   Staphylococcus aureus NOT DETECTED NOT DETECTED   Methicillin resistance NOT DETECTED NOT DETECTED   Streptococcus species NOT DETECTED NOT DETECTED   Streptococcus agalactiae NOT DETECTED NOT DETECTED   Streptococcus pneumoniae NOT DETECTED NOT DETECTED   Streptococcus pyogenes NOT DETECTED NOT DETECTED   Acinetobacter baumannii NOT DETECTED NOT DETECTED   Enterobacteriaceae species NOT DETECTED NOT DETECTED   Enterobacter cloacae complex NOT DETECTED NOT DETECTED   Escherichia coli DETECTED (A) NOT DETECTED   Klebsiella oxytoca NOT DETECTED NOT DETECTED   Klebsiella pneumoniae NOT DETECTED NOT DETECTED   Proteus species NOT DETECTED NOT DETECTED   Serratia marcescens NOT DETECTED NOT DETECTED   Carbapenem resistance NOT DETECTED NOT DETECTED   Haemophilus influenzae NOT DETECTED NOT DETECTED   Neisseria meningitidis NOT DETECTED NOT DETECTED   Pseudomonas aeruginosa NOT DETECTED NOT DETECTED   Candida albicans NOT DETECTED NOT DETECTED   Candida glabrata NOT DETECTED NOT DETECTED   Candida krusei NOT DETECTED NOT DETECTED   Candida parapsilosis NOT DETECTED NOT DETECTED   Candida tropicalis NOT DETECTED NOT DETECTED    Name of physician (or Provider) Contacted: Dr Estanislado Pandy   Changes to prescribed antibiotics required: Yes, Pt growing GPC (Staph and Strep),  Mech A detected.  Will start pt on Vancomycin, pharmacy dosing.   Keavon Sensing D 07/09/2016  6:25 AM

## 2016-07-10 ENCOUNTER — Inpatient Hospital Stay: Payer: Medicare Other

## 2016-07-10 ENCOUNTER — Encounter: Payer: Self-pay | Admitting: Urology

## 2016-07-10 DIAGNOSIS — A4151 Sepsis due to Escherichia coli [E. coli]: Principal | ICD-10-CM

## 2016-07-10 LAB — BLOOD GAS, ARTERIAL
ACID-BASE DEFICIT: 13 mmol/L — AB (ref 0.0–2.0)
Acid-Base Excess: 1.1 mmol/L (ref 0.0–3.0)
BICARBONATE: 15 meq/L — AB (ref 21.0–28.0)
Bicarbonate: 27.8 mEq/L (ref 21.0–28.0)
FIO2: 0.3
MECHVT: 400 mL
O2 SAT: 80.7 %
O2 Saturation: 93.1 %
PATIENT TEMPERATURE: 37
PATIENT TEMPERATURE: 37
PCO2 ART: 43 mmHg (ref 32.0–48.0)
PEEP: 5 cmH2O
PH ART: 7.15 — AB (ref 7.350–7.450)
PH ART: 7.32 — AB (ref 7.350–7.450)
PO2 ART: 73 mmHg — AB (ref 83.0–108.0)
RATE: 24 resp/min
pCO2 arterial: 54 mmHg — ABNORMAL HIGH (ref 32.0–48.0)
pO2, Arterial: 59 mmHg — ABNORMAL LOW (ref 83.0–108.0)

## 2016-07-10 LAB — GLUCOSE, CAPILLARY
GLUCOSE-CAPILLARY: 112 mg/dL — AB (ref 65–99)
GLUCOSE-CAPILLARY: 73 mg/dL (ref 65–99)
GLUCOSE-CAPILLARY: 75 mg/dL (ref 65–99)
GLUCOSE-CAPILLARY: 86 mg/dL (ref 65–99)
GLUCOSE-CAPILLARY: 91 mg/dL (ref 65–99)
Glucose-Capillary: 67 mg/dL (ref 65–99)
Glucose-Capillary: 87 mg/dL (ref 65–99)

## 2016-07-10 LAB — CBC
HCT: 25.1 % — ABNORMAL LOW (ref 35.0–47.0)
Hemoglobin: 8 g/dL — ABNORMAL LOW (ref 12.0–16.0)
MCH: 24.6 pg — AB (ref 26.0–34.0)
MCHC: 32 g/dL (ref 32.0–36.0)
MCV: 76.8 fL — ABNORMAL LOW (ref 80.0–100.0)
PLATELETS: 79 10*3/uL — AB (ref 150–440)
RBC: 3.28 MIL/uL — AB (ref 3.80–5.20)
RDW: 18.5 % — ABNORMAL HIGH (ref 11.5–14.5)
WBC: 17.7 10*3/uL — ABNORMAL HIGH (ref 3.6–11.0)

## 2016-07-10 LAB — RENAL FUNCTION PANEL
ALBUMIN: 1.8 g/dL — AB (ref 3.5–5.0)
ALBUMIN: 1.8 g/dL — AB (ref 3.5–5.0)
ALBUMIN: 2 g/dL — AB (ref 3.5–5.0)
ANION GAP: 9 (ref 5–15)
ANION GAP: 7 (ref 5–15)
Albumin: 1.8 g/dL — ABNORMAL LOW (ref 3.5–5.0)
Albumin: 1.8 g/dL — ABNORMAL LOW (ref 3.5–5.0)
Albumin: 1.9 g/dL — ABNORMAL LOW (ref 3.5–5.0)
Anion gap: 7 (ref 5–15)
Anion gap: 9 (ref 5–15)
Anion gap: 7 (ref 5–15)
Anion gap: 9 (ref 5–15)
BUN: 30 mg/dL — ABNORMAL HIGH (ref 6–20)
BUN: 27 mg/dL — AB (ref 6–20)
BUN: 26 mg/dL — AB (ref 6–20)
BUN: 22 mg/dL — AB (ref 6–20)
BUN: 21 mg/dL — ABNORMAL HIGH (ref 6–20)
BUN: 20 mg/dL (ref 6–20)
CALCIUM: 7.6 mg/dL — AB (ref 8.9–10.3)
CALCIUM: 7.6 mg/dL — AB (ref 8.9–10.3)
CALCIUM: 7.9 mg/dL — AB (ref 8.9–10.3)
CHLORIDE: 104 mmol/L (ref 101–111)
CHLORIDE: 106 mmol/L (ref 101–111)
CO2: 26 mmol/L (ref 22–32)
CO2: 26 mmol/L (ref 22–32)
CO2: 25 mmol/L (ref 22–32)
CO2: 25 mmol/L (ref 22–32)
CO2: 25 mmol/L (ref 22–32)
CO2: 24 mmol/L (ref 22–32)
CREATININE: 0.88 mg/dL (ref 0.44–1.00)
CREATININE: 0.91 mg/dL (ref 0.44–1.00)
Calcium: 7.7 mg/dL — ABNORMAL LOW (ref 8.9–10.3)
Calcium: 7.6 mg/dL — ABNORMAL LOW (ref 8.9–10.3)
Calcium: 8 mg/dL — ABNORMAL LOW (ref 8.9–10.3)
Chloride: 103 mmol/L (ref 101–111)
Chloride: 104 mmol/L (ref 101–111)
Chloride: 103 mmol/L (ref 101–111)
Chloride: 105 mmol/L (ref 101–111)
Creatinine, Ser: 1.1 mg/dL — ABNORMAL HIGH (ref 0.44–1.00)
Creatinine, Ser: 0.89 mg/dL (ref 0.44–1.00)
Creatinine, Ser: 0.95 mg/dL (ref 0.44–1.00)
Creatinine, Ser: 0.79 mg/dL (ref 0.44–1.00)
GFR calc Af Amer: >60 mL/min (ref >60)
GFR calc Af Amer: >60 mL/min (ref >60)
GFR calc Af Amer: >60 mL/min (ref >60)
GFR calc non Af Amer: 50 mL/min — ABNORMAL LOW (ref >60)
GFR calc non Af Amer: >60 mL/min (ref >60)
GFR calc non Af Amer: >60 mL/min (ref >60)
GFR calc non Af Amer: 59 mL/min — ABNORMAL LOW (ref >60)
GFR calc non Af Amer: >60 mL/min (ref >60)
GFR, EST AFRICAN AMERICAN: 58 mL/min — AB (ref >60)
GLUCOSE: 118 mg/dL — AB (ref 65–99)
GLUCOSE: 87 mg/dL (ref 65–99)
GLUCOSE: 89 mg/dL (ref 65–99)
GLUCOSE: 81 mg/dL (ref 65–99)
Glucose, Bld: 95 mg/dL (ref 65–99)
Glucose, Bld: 85 mg/dL (ref 65–99)
PHOSPHORUS: 2.5 mg/dL (ref 2.5–4.6)
PHOSPHORUS: 2.1 mg/dL — AB (ref 2.5–4.6)
PHOSPHORUS: 2.4 mg/dL — AB (ref 2.5–4.6)
PHOSPHORUS: 2 mg/dL — AB (ref 2.5–4.6)
POTASSIUM: 4.3 mmol/L (ref 3.5–5.1)
POTASSIUM: 4.2 mmol/L (ref 3.5–5.1)
POTASSIUM: 4.1 mmol/L (ref 3.5–5.1)
POTASSIUM: 4.4 mmol/L (ref 3.5–5.1)
POTASSIUM: 4.3 mmol/L (ref 3.5–5.1)
Phosphorus: 2.1 mg/dL — ABNORMAL LOW (ref 2.5–4.6)
Phosphorus: 2 mg/dL — ABNORMAL LOW (ref 2.5–4.6)
Potassium: 4 mmol/L (ref 3.5–5.1)
SODIUM: 138 mmol/L (ref 135–145)
SODIUM: 137 mmol/L (ref 135–145)
SODIUM: 138 mmol/L (ref 135–145)
Sodium: 137 mmol/L (ref 135–145)
Sodium: 136 mmol/L (ref 135–145)
Sodium: 138 mmol/L (ref 135–145)

## 2016-07-10 LAB — URINE CULTURE: Culture: >=100000 — AB

## 2016-07-10 LAB — MAGNESIUM: MAGNESIUM: 1.8 mg/dL (ref 1.7–2.4)

## 2016-07-10 LAB — PROCALCITONIN: PROCALCITONIN: 44.12 ng/mL

## 2016-07-10 MED ORDER — ALTEPLASE 2 MG IJ SOLR
2.0000 mg | Freq: Once | INTRAMUSCULAR | Status: AC
  Administered 2016-07-10: 2 mg
  Filled 2016-07-10: qty 2

## 2016-07-10 MED ORDER — PREDNISONE 20 MG PO TABS
40.0000 mg | ORAL_TABLET | Freq: Every day | ORAL | Status: AC
  Administered 2016-07-11: 40 mg
  Filled 2016-07-10: qty 2

## 2016-07-10 MED ORDER — PREDNISONE 10 MG PO TABS
10.0000 mg | ORAL_TABLET | Freq: Every day | ORAL | Status: DC
Start: 2016-07-14 — End: 2016-07-11

## 2016-07-10 MED ORDER — STERILE WATER FOR INJECTION IJ SOLN
INTRAMUSCULAR | Status: AC
  Administered 2016-07-10: 09:00:00
  Filled 2016-07-10: qty 10

## 2016-07-10 MED ORDER — PREDNISONE 10 MG PO TABS: 30.0000 mg | ORAL_TABLET | Freq: Every day | ORAL | Status: DC

## 2016-07-10 MED ORDER — PREDNISONE 10 MG PO TABS: 5.0000 mg | ORAL_TABLET | Freq: Every day | ORAL | Status: DC

## 2016-07-10 MED ORDER — PREDNISONE 20 MG PO TABS: 20.0000 mg | ORAL_TABLET | Freq: Every day | ORAL | Status: DC

## 2016-07-10 NOTE — Progress Notes (Signed)
MEDICATION RELATED CONSULT NOTE - INITIAL   Pharmacy Consult for Steroid Taper   Allergies  Allergen Reactions  . Macrobid [Nitrofurantoin Monohyd Macro] Itching, Rash and Swelling    PT HOSPITALIZED  . Penicillins Shortness Of Breath, Rash and Other (See Comments)    Has patient had a PCN reaction causing immediate rash, facial/tongue/throat swelling, SOB or lightheadedness with hypotension: Yes Has patient had a PCN reaction causing severe rash involving mucus membranes or skin necrosis: No Has patient had a PCN reaction that required hospitalization Yes Has patient had a PCN reaction occurring within the last 10 years: No If all of the above answers are "NO", then may proceed with Cephalosporin use.   . Tape Rash  . Azithromycin Other (See Comments)    Reaction: unknown   . Clarithromycin Other (See Comments)    Abdominal pain  . Nitroglycerin Other (See Comments)    Reaction - unknown  . Leflunomide Rash  . Morphine Nausea And Vomiting    Nausea and vomiting  . Naproxen Rash    Patient Measurements: Height: 5' 6"  (167.6 cm) Weight: 218 lb 7.6 oz (99.1 kg) IBW/kg (Calculated) : 59.3  Vital Signs: Temp: 97.3 F (36.3 C) (08/21 1200) Temp Source: Rectal (08/21 1200) BP: 115/55 (08/21 1215) Pulse Rate: 100 (08/21 1215) Intake/Output from previous day: 08/20 0701 - 08/21 0700 In: 6937.6 [I.V.:6787.6; IV Piggyback:150] Out: 161 [Urine:161] Intake/Output from this shift: Total I/O In: 247.9 [I.V.:247.9] Out: 50 [Urine:50]  Labs:  Recent Labs  07/04/2016 0918  07/07/2016 1822 07/09/16 0430  07/10/16 0102 07/10/16 0506 07/10/16 0540 07/10/16 1032  WBC 5.9  --  19.9* 16.1*  --   --  17.7*  --   --   HGB 9.1*  --  8.0* 7.9*  --   --  8.0*  --   --   HCT 29.0*  --  26.6* 25.2*  --   --  25.1*  --   --   PLT 143*  --  138* 104*  --   --  79*  --   --   CREATININE 1.50*  --  1.53* 1.73*  < > 1.10* 0.88  --  0.91  MG  --   --  1.3* 1.7  --   --   --  1.8  --   PHOS   --   < > 3.4 3.3  < > 2.5 2.1*  --  2.4*  ALBUMIN 2.3*  --  2.0*  --   < > 1.8* 1.8*  --  1.8*  PROT 5.6*  --  5.1*  --   --   --   --   --   --   AST 76*  --  123*  --   --   --   --   --   --   ALT 46  --  77*  --   --   --   --   --   --   ALKPHOS 92  --  77  --   --   --   --   --   --   BILITOT 0.5  --  0.5  --   --   --   --   --   --   < > = values in this interval not displayed. Estimated Creatinine Clearance: 68.3 mL/min (by C-G formula based on SCr of 0.91 mg/dL).   Microbiology: Recent Results (from the past 720 hour(s))  Blood Culture (routine  x 2)     Status: Abnormal (Preliminary result)   Collection Time: 07/05/2016  9:18 AM  Result Value Ref Range Status   Specimen Description BLOOD LEFT HAND  Final   Special Requests   Final    BOTTLES DRAWN AEROBIC AND ANAEROBIC  AER [place] ANA 5CC   Culture  Setup Time   Final    GRAM NEGATIVE RODS AEROBIC BOTTLE ONLY CRITICAL RESULT CALLED TO, READ BACK BY AND VERIFIED WITH: JASON ROBBINS [place] AT [place] 07/09/16 MSS. GRAM POSITIVE COCCI ANAEROBIC BOTTLE ONLY CRITICAL RESULT CALLED TO, READ BACK BY AND VERIFIED WITH: JASON ROBBINS AT 9242 ON 07/09/16.[place] CONFIRMED BY [facility]    Culture ESCHERICHIA COLI (A)  Final   Report Status PENDING  Incomplete  Blood Culture (routine x 2)     Status: None (Preliminary result)   Collection Time: 07/13/2016  9:18 AM  Result Value Ref Range Status   Specimen Description BLOOD RIGHT HAND  Final   Special Requests BOTTLES DRAWN AEROBIC AND ANAEROBIC  5CC  Final   Culture  Setup Time   Final    GRAM NEGATIVE RODS AEROBIC BOTTLE ONLY CRITICAL VALUE NOTED.  VALUE IS CONSISTENT WITH PREVIOUSLY REPORTED AND CALLED VALUE. JASON ROBBINS PH AT 0025 07/09/16 MSS. CONFIRMED BY [facility]    Culture GRAM NEGATIVE RODS  Final   Report Status PENDING  Incomplete  Urine culture     Status: Abnormal   Collection Time: 06/26/2016  9:18 AM  Result Value Ref Range Status   Specimen Description URINE, RANDOM  Final   Special  Requests NONE  Final   Culture (A)  Final    >=100,000 COLONIES/mL ESCHERICHIA COLI Confirmed Extended Spectrum Beta-Lactamase Producer (ESBL) Performed at [facility]    Report Status 07/10/2016 FINAL  Final   Organism ID, Bacteria ESCHERICHIA COLI (A)  Final      Susceptibility   Escherichia coli - MIC*    AMPICILLIN >=32 RESISTANT Resistant     CEFAZOLIN >=64 RESISTANT Resistant     CEFTRIAXONE >=64 RESISTANT Resistant     CIPROFLOXACIN >=4 RESISTANT Resistant     GENTAMICIN <=1 SENSITIVE Sensitive     IMIPENEM <=0.25 SENSITIVE Sensitive     NITROFURANTOIN <=16 SENSITIVE Sensitive     TRIMETH/SULFA >=320 RESISTANT Resistant     AMPICILLIN/SULBACTAM 4 SENSITIVE Sensitive     PIP/TAZO <=4 SENSITIVE Sensitive     Extended ESBL POSITIVE Resistant     * >=100,000 COLONIES/mL ESCHERICHIA COLI  Blood Culture ID Panel (Reflexed)     Status: Abnormal   Collection Time: 07/10/2016  9:18 AM  Result Value Ref Range Status   Enterococcus species NOT DETECTED NOT DETECTED Final   Vancomycin resistance NOT DETECTED NOT DETECTED Final   Listeria monocytogenes NOT DETECTED NOT DETECTED Final   Staphylococcus species NOT DETECTED NOT DETECTED Final   Staphylococcus aureus NOT DETECTED NOT DETECTED Final   Methicillin resistance NOT DETECTED NOT DETECTED Final   Streptococcus species NOT DETECTED NOT DETECTED Final   Streptococcus agalactiae NOT DETECTED NOT DETECTED Final   Streptococcus pneumoniae NOT DETECTED NOT DETECTED Final   Streptococcus pyogenes NOT DETECTED NOT DETECTED Final   Acinetobacter baumannii NOT DETECTED NOT DETECTED Final   Enterobacteriaceae species NOT DETECTED NOT DETECTED Final   Enterobacter cloacae complex NOT DETECTED NOT DETECTED Final   Escherichia coli DETECTED (A) NOT DETECTED Final    Comment: CRITICAL RESULT CALLED TO, READ BACK BY AND VERIFIED WITH: JASON ROBBINS [place] AT 0025 07/09/16 MSS.  Klebsiella oxytoca NOT DETECTED NOT DETECTED Final    Klebsiella pneumoniae NOT DETECTED NOT DETECTED Final   Proteus species NOT DETECTED NOT DETECTED Final   Serratia marcescens NOT DETECTED NOT DETECTED Final   Carbapenem resistance NOT DETECTED NOT DETECTED Final   Haemophilus influenzae NOT DETECTED NOT DETECTED Final   Neisseria meningitidis NOT DETECTED NOT DETECTED Final   Pseudomonas aeruginosa NOT DETECTED NOT DETECTED Final   Candida albicans NOT DETECTED NOT DETECTED Final   Candida glabrata NOT DETECTED NOT DETECTED Final   Candida krusei NOT DETECTED NOT DETECTED Final   Candida parapsilosis NOT DETECTED NOT DETECTED Final   Candida tropicalis NOT DETECTED NOT DETECTED Final  Blood Culture ID Panel (Reflexed)     Status: Abnormal   Collection Time: 07/18/2016  9:18 AM  Result Value Ref Range Status   Enterococcus species NOT DETECTED NOT DETECTED Final   Vancomycin resistance NOT DETECTED NOT DETECTED Final   Listeria monocytogenes NOT DETECTED NOT DETECTED Final   Staphylococcus species DETECTED (A) NOT DETECTED Final    Comment: CRITICAL RESULT CALLED TO, READ BACK BY AND VERIFIED WITH: JASON ROBBINS AT 9024 ON 07/09/16.[place] Kitchen[place]    Staphylococcus aureus NOT DETECTED NOT DETECTED Final   Methicillin resistance NOT DETECTED NOT DETECTED Final   Streptococcus species DETECTED (A) NOT DETECTED Final    Comment: CRITICAL RESULT CALLED TO, READ BACK BY AND VERIFIED WITH: JASON ROBBINS AT 0973 ON 07/09/16.[place] Kitchen[place]    Streptococcus agalactiae NOT DETECTED NOT DETECTED Final   Streptococcus pneumoniae NOT DETECTED NOT DETECTED Final   Streptococcus pyogenes NOT DETECTED NOT DETECTED Final   Acinetobacter baumannii NOT DETECTED NOT DETECTED Final   Enterobacteriaceae species NOT DETECTED NOT DETECTED Final   Enterobacter cloacae complex NOT DETECTED NOT DETECTED Final   Escherichia coli NOT DETECTED NOT DETECTED Final   Klebsiella oxytoca NOT DETECTED NOT DETECTED Final   Klebsiella pneumoniae NOT DETECTED NOT DETECTED Final   Proteus  species NOT DETECTED NOT DETECTED Final   Serratia marcescens NOT DETECTED NOT DETECTED Final   Carbapenem resistance NOT DETECTED NOT DETECTED Final   Haemophilus influenzae NOT DETECTED NOT DETECTED Final   Neisseria meningitidis NOT DETECTED NOT DETECTED Final   Pseudomonas aeruginosa NOT DETECTED NOT DETECTED Final   Candida albicans NOT DETECTED NOT DETECTED Final   Candida glabrata NOT DETECTED NOT DETECTED Final   Candida krusei NOT DETECTED NOT DETECTED Final   Candida parapsilosis NOT DETECTED NOT DETECTED Final   Candida tropicalis NOT DETECTED NOT DETECTED Final  MRSA PCR Screening     Status: None   Collection Time: 07/14/2016  1:40 PM  Result Value Ref Range Status   MRSA by PCR NEGATIVE NEGATIVE Final    Comment:        The GeneXpert MRSA Assay (FDA approved for NASAL specimens only), is one component of a comprehensive MRSA colonization surveillance program. It is not intended to diagnose MRSA infection nor to guide or monitor treatment for MRSA infections.   Urine culture     Status: Abnormal (Preliminary result)   Collection Time: 06/22/2016  3:18 PM  Result Value Ref Range Status   Specimen Description URINE, RANDOM LEFT RENAL URINE CULTURE  Final   Special Requests NONE  Final   Culture >=100,000 COLONIES/mL ESCHERICHIA COLI (A)  Final   Report Status PENDING  Incomplete    Medical History: Past Medical History:  Diagnosis Date  . Anemia   . Anemia   . Anxiety   . Arthritis   .  CAD (coronary artery disease)   . Candidiasis   . CHF (congestive heart failure) ([place])   . Chronic kidney disease   . Chronic pain   . COPD (chronic obstructive pulmonary disease) ([place])   . Coronary artery disease   . Depression   . Diabetes mellitus without complication ([place])   . Edema   . GERD (gastroesophageal reflux disease)   . GERD (gastroesophageal reflux disease)   . Heart disease   . Heel ulcer ([facility])   . Hyperkalemia   . Hyperlipidemia   . Hypertension   . Long  term current use of antibiotics   . Malnutrition ([place])   . MRSA (methicillin resistant Staphylococcus aureus)   . Myocardial infarction ([place])   . Necrotizing fasciitis ([place])   . Nephrolithiasis   . Osteoporosis   . Sacral pressure ulcer   . Seasonal allergies   . Shortness of breath   . Ulcers of both lower legs ([place])    Non-pressure related  . Upper respiratory infection   . Urinary retention   . UTI (lower urinary tract infection)     Medications:  Scheduled:  . antiseptic oral rinse  7 mL Mouth Rinse Q2H  . budesonide (PULMICORT) nebulizer solution  0.5 mg Nebulization BID  . chlorhexidine gluconate (SAGE KIT)  15 mL Mouth Rinse BID  . famotidine (PEPCID) IV  20 mg Intravenous Q24H  . heparin subcutaneous  5,000 Units Subcutaneous Q8H  . hydrocortisone sod succinate (SOLU-CORTEF) inj  50 mg Intravenous Q6H  . insulin aspart  2-6 Units Subcutaneous Q4H  . ipratropium-albuterol  3 mL Nebulization Q6H  . meropenem (MERREM) IV  1 g Intravenous Q8H  . [START ON 07/27/16] predniSONE  40 mg Per Tube Daily   Followed by  . [START ON 07/12/2016] predniSONE  30 mg Per Tube Daily   Followed by  . [START ON 07/13/2016] predniSONE  20 mg Per Tube Daily   Followed by  . [START ON 07/14/2016] predniSONE  10 mg Per Tube Daily   Followed by  . [START ON 07/15/2016] predniSONE  5 mg Per Tube Daily   Infusions:  . fentaNYL infusion INTRAVENOUS 275 mcg/hr (07/10/16 1200)  . norepinephrine (LEVOPHED) Adult infusion 12 mcg/min (07/10/16 1200)  . pureflow 2,500 mL/hr at 07/09/16 1852  . vasopressin (PITRESSIN) infusion - *FOR SHOCK* 0.02 Units/min (07/09/16 2015)    Assessment: 70 y/o F on stress-dose hydrocortisone ordered steroid taper.   Plan:  Will begin taper tomorrow with prednisone over 5 days.   Ulice Dash D 07/10/2016,1:05 PM

## 2016-07-10 NOTE — Progress Notes (Signed)
Initial Nutrition Assessment  DOCUMENTATION CODES:   Obesity unspecified  INTERVENTION:  -Discussed nutritional poc during ICU rounds; MD plan to place OG to LIS today, no initiation of TF at present. If unable to extbate within 24-48 hours, recommend initiaition of TF as soon as medically able   NUTRITION DIAGNOSIS:   Inadequate oral intake related to acute illness as evidenced by NPO status.  GOAL:   Provide needs based on ASPEN/SCCM guidelines  MONITOR:   Vent status, Labs, Weight trends  REASON FOR ASSESSMENT:   Ventilator    ASSESSMENT:   70 yo female admitted with acute respiratory failure secondary to septic shock due to UTI with ARF, metabolic acidosis requiring CRRT and intubation    Past Medical History:  Diagnosis Date  . Anemia   . Anemia   . Anxiety   . Arthritis   . CAD (coronary artery disease)   . Candidiasis   . CHF (congestive heart failure) ([place])   . Chronic kidney disease   . Chronic pain   . COPD (chronic obstructive pulmonary disease) ([place])   . Coronary artery disease   . Depression   . Diabetes mellitus without complication ([place])   . Edema   . GERD (gastroesophageal reflux disease)   . GERD (gastroesophageal reflux disease)   . Heart disease   . Heel ulcer ([place])   . Hyperkalemia   . Hyperlipidemia   . Hypertension   . Long term current use of antibiotics   . Malnutrition ([place])   . MRSA (methicillin resistant Staphylococcus aureus)   . Myocardial infarction ([place])   . Necrotizing fasciitis ([place])   . Nephrolithiasis   . Osteoporosis   . Sacral pressure ulcer   . Seasonal allergies   . Shortness of breath   . Ulcers of both lower legs ([place])    Non-pressure related  . Upper respiratory infection   . Urinary retention   . UTI (lower urinary tract infection)      Diet Order:  Diet NPO time specified  Skin:  Wound (see comment) (multiple stage I//II pressure ulcers)  Last BM:  no documented BM  Labs and Meds:  reviewed  Height:   Ht Readings from Last 1 Encounters:  07/15/2016 5\' 6"  (1.676 m)    Weight:   Wt Readings from Last 1 Encounters:  07/10/16 218 lb 7.6 oz (99.1 kg)    Ideal Body Weight:     BMI:  Body mass index is 35.26 kg/m.  Estimated Nutritional Needs:   Kcal:  [id number] kcals  Protein:  >/= 198 g  Fluid:  per MD  EDUCATION NEEDS:   No education needs identified at this time  Kerman Passey [place], [place], [place] [phone] Pager  [phone] Weekend/On-Call Pager

## 2016-07-10 NOTE — Progress Notes (Signed)
PULMONARY / CRITICAL CARE MEDICINE   Name: Suzanne Ewing MRN: [id number] DOB: [birth year]    ADMISSION DATE:  07/14/2016  CHIEF COMPLAINT:  Septic shock s/p left ureteral stent placement  HISTORY OF PRESENT ILLNESS:   70 yo young past medical history of recurrent UTI, COPD, chronic disease, coronary artery disease, diabetes, congestive heart failure, recent left-sided lithotripsy for left ureteral calculi and bladder calculi, presents to the ER with hypotension, confusion, and altered mental status. Patient is critically ill and confused, unable to provide a appropriate history, history from chart. Per chart review patient was found small into be hypotensive with a pressure in the 60s over 40s. She is normally awake and alert, able to move around in a motor scooter, both found to be somnolent and minimally responsive and arousable. In route, too large for IVs were started and she was given a 250 mL bolus of fluids with minimal response and pressure. Family gave a history that patient had lithotripsy about 1.5 weeks ago and then started complaining of inability to void completely this week. She had a urinalysis that showed nitrite positive organisms at a previous urology visit, then yesterday had another urinalysis done at urology follow-up, results are still pending.  In the ER she was given 3 L of normal saline, started on norepinephrine 10 g, blood pressure still in the 70s and 80s. CCM was called for admission.  Will review chart shows the patient is a resident at [facility], she is in follow-up with urology on 8/15 noted to have 3+ nitrites and a urinalysis, on 8/17 urology ordered a catheterized urine culture sample, this is yet to be resulted. ED physician placed RIJ CVL  SUBJECTIVE:  Shock improved; MAPs in the 73s and 70s. Titrated off vasopressin; now on 16mcg of levophed. Remains anuric; improved creatinine with CRRT.  VITAL SIGNS: BP (!) 106/53   Pulse 100   Temp 97 F (36.1 C)  (Axillary)   Resp (!) 30   Ht 5\' 6"  (1.676 m)   Wt 218 lb 7.6 oz (99.1 kg)   SpO2 100%   BMI 35.26 kg/m   HEMODYNAMICS: CVP:  [11 mmHg-14 mmHg] 12 mmHg  VENTILATOR SETTINGS: Vent Mode: PRVC FiO2 (%):  [40 %-50 %] 40 % Set Rate:  [30 bmp] 30 bmp Vt Set:  [400 mL] 400 mL PEEP:  [5 cmH20] 5 cmH20  INTAKE / OUTPUT: I/O last 3 completed shifts: In: 6964.5 [I.V.:6764.5; IV Piggyback:200] Out: 315 [Urine:315]  PHYSICAL EXAMINATION: Constitutional: She is well-developed, well-nourished, and appears acutely ill.  HEENT: Normocephalic and atraumatic; conjunctivae and EOM are normal. Pupils are equal, round, and reactive to light. Neck with normal range of motion and supple.  Pulmonary/Chest: Normal WOB, bilateral airflow but decreased breath sounds in the bases Abdominal: Distended/obese, lower abdominal quadrant palpable mass, no tenderness. L sided colostomy bag  Musculoskeletal: Normal range of motion; no visible deformities.  Neurological: Sedated, awakens to noxious stimulus and name, moves all extremities Extremities: Feet cyanotic, +1 pulses in BLLE and +2 in upper extremities Skin: Warm to touch, Bilateral heel ulcers (R>L) - well healed with protective dressing Nursing note and vitals reviewed.  LABS:  BMET  Recent Labs Lab 07/09/16 2145 07/10/16 0102 07/10/16 0506  NA 137 138 137  K 4.3 4.3 4.2  CL 104 103 104  CO2 25 26 26   BUN 34* 30* 27*  CREATININE 1.31* 1.10* 0.88  GLUCOSE 108* 118* 95    Electrolytes  Recent Labs Lab 07/19/2016 1822 07/09/16  0430  07/09/16 2145 07/10/16 0102 07/10/16 0506  CALCIUM 8.0* 7.8*  < > 7.5* 7.6* 7.7*  MG 1.3* 1.7  --   --   --   --   PHOS 3.4 3.3  < > 2.8 2.5 2.1*  < > = values in this interval not displayed.  CBC  Recent Labs Lab 07/04/2016 1822 07/09/16 0430 07/10/16 0506  WBC 19.9* 16.1* 17.7*  HGB 8.0* 7.9* 8.0*  HCT 26.6* 25.2* 25.1*  PLT 138* 104* 79*    Coag's No results for input(s): APTT, INR in the  last 168 hours.  Sepsis Markers  Recent Labs Lab 06/23/2016 0918  07/09/16 0430 07/09/16 0904 07/09/16 1648 07/10/16 0506  LATICACIDVEN 5.1*  < > 3.0* 2.7* 1.7  --   PROCALCITON 50.01  --  81.46  --   --  44.12  < > = values in this interval not displayed.  ABG  Recent Labs Lab 07/10/2016 2242 07/09/16 0425 07/09/16 0745  PHART 7.24* 7.25* 7.24*  PCO2ART 42 43 47  PO2ART 86 87 70*    Liver Enzymes  Recent Labs Lab 07/01/2016 0918 06/25/2016 1822  07/09/16 2145 07/10/16 0102 07/10/16 0506  AST 76* 123*  --   --   --   --   ALT 46 77*  --   --   --   --   ALKPHOS 92 77  --   --   --   --   BILITOT 0.5 0.5  --   --   --   --   ALBUMIN 2.3* 2.0*  < > 1.9* 1.8* 1.8*  < > = values in this interval not displayed.  Cardiac Enzymes  Recent Labs Lab 07/18/2016 0918  TROPONINI 0.06*    Glucose  Recent Labs Lab 07/09/16 1558 07/09/16 1935 07/09/16 2324 07/09/16 2347 07/10/16 0052 07/10/16 0411  GLUCAP 124* 109* 60* 144* 112* 91    Imaging No results found.  STUDIES:  Cystoscopy 08/19>left ureteral stent placement  CULTURES: Blood x2 08/08/16> GNR>E.Coli and Staph Species Urine 07/12/2016>  ANTIBIOTICS: Vancomycin 08/19> Meropenem 08/19>  SIGNIFICANT EVENTS: 8/8> left ureteral calculi, bladder calculi, cystoscopy with uroscopy, lithotripsy and indwelling stent inserted on the left, removal of bladder stone, less than 2.5 cm, lithotripsy including insertion of indwelling urethral stent, crush calculus and removal of fragments. 8/15> urology follow-up, complaining of voiding issues, UA with 3+ nitrites 8/17> family states patient incompletely able to void, urology ordered catheterized urine culture 8/19>septic shock, urgent L ureteral stent, 20cc of cloud fluid removed from L ureter by urology, worsening shock, ETT from OR, bicarb gtt 8/20>GNR bacteremia, septic shock, remains ETT,   LINES/TUBES: Right IJ 8/19>  DISCUSSION: 70 yo F with recent  left-sided lithotripsy, history of recurrent UTI, diabetes, CAD, hyperlipidemia, now with septic shock-source UTI, severe metabolic\lactic acidosis, E.coli bacteremia  ASSESSMENT / PLAN:  PULMONARY Acute hypoxemic respiratory failure Severe metabolic acidosis Right-sided loculated pleural effusion-chronic; last thoracentesis 11/30/2015, no growth.  H/O COPD P: Full vent support with current settings Maintain O2 sat>88% WA when stable Pulmicort nebs BID PRN Duonebs Will consider repeat thoracentesis if effusions worsen and sepsis persists F/u ABG and CXR   Cardiovascular A: Septic shock-due to UTI Hx of Hypertension Afib-ECHO 11/2015>EF 40-45%, LVH, sys CHF, mild MR,  mild LA dilation History of coronary artery disease Peripheral vascular disease P: Continue to titrate levophed to keep MAP 65 and above Wean pressors as tolerated Hold beta blockers for now given hypotension Hold lasix, diltiazem and  all home BP meds in light of shock Taper off solu-cortef  RENAL Acute Left hydronephrosis - seen intraoperatively (See urology note) Hx of left Renal calculi status post left-sided lithotripsy with ureteral stent placement Oliguria s/p left ureteral stent placement with worsening creatinine-creatinine improved with CRRT Recurrent UTI History of chronic indwelling catheter Acute renal failure Metabolic Acidosis/Lactic Acidosis-Lactic acidosis resolved P: Monitor BMP, and Cr Avoid nephrotoxic drugs Decrease bicarb infusion to 86ml/hr Irrigate foley prn Nephrology following CRRT per nephrology  Per Urology> recommend f/u urine output, IV hydration and treat acidosis  GI A: H/O GERD S/p L colostomy Bag placement P: Cont with GI prophylaxis Colostomy care  INFECTIOUS DISEASE Septic shock-urinary source Recurrent UTI Bacteremia-GNR P: F/U cultures Follow-up pro-calcitonin Abx as stated above  ENDOCRINE A: Type 2 DM P: ICU hypo/hyperglycemia  protocol SSI  NEURO AMS Acute metabolic Encephalopathy related to underlying infection  A: Avoid sedative drugs if possible Cont to monitor neuro status.  Continue current treatment for acidosis and sepsis  Disposition and family update: No family at bedside. Will update when available.  Kattleya Kuhnert S. [facility] ANP-BC Pulmonary and [place] Pager [phone] or [phone]   07/10/2016, 6:30 AM

## 2016-07-10 NOTE — Progress Notes (Signed)
Pt remains comfortably sedated via fentanyl 267mcg, but arouses to speech. Neuro status unchanged throughout shift - not following commands. Heart - sinus tach and respiratory system - clear and synchronous with ventilator.   Today - stopped bicarb drip, family made DNR, and OG to LIWS. Urine output continued to be low - 69mL throughout 12 hour shift.   Information reported off to night shift RN.  Guadalupe Maple, RN

## 2016-07-10 NOTE — Progress Notes (Signed)
[facility]  ROUNDING NOTE   Subjective:   Critically ill. Daughter and niece at bedside.  CRRT overnight, held due to high venous pressures. TPA ordered.   Wbc 17.7, hgb 8, platelets 79  Vanco, meropenem. E. Coli in blood and urine.   Sodium bicarb at 103m/hr  CVP 15  Objective:  Vital signs in last 24 hours:  Temp:  [96.8 F (36 C)-97.6 F (36.4 C)] 97 F (36.1 C) (08/21 0057) Pulse Rate:  [97-107] 99 (08/21 0900) Resp:  [0-30] 24 (08/21 0900) BP: (80-122)/(45-71) 97/52 (08/21 0900) SpO2:  [97 %-100 %] 100 % (08/21 0900) FiO2 (%):  [40 %-50 %] 40 % (08/21 0900) Weight:  [99.1 kg (218 lb 7.6 oz)] 99.1 kg (218 lb 7.6 oz) (08/21 0415)  Weight change: 12 kg (26 lb 7.6 oz) Filed Weights   07/06/2016 1334 07/09/16 0448 07/10/16 0415  Weight: 90.2 kg (198 lb 13.7 oz) 94 kg (207 lb 3.7 oz) 99.1 kg (218 lb 7.6 oz)    Intake/Output: I/O last 3 completed shifts: In: 12674.7 [I.V.:12324.7; IV Piggyback:350] Out: 161 [Urine:161]   Intake/Output this shift:  No intake/output data recorded.  Physical Exam: General: Critically Ill  Head: ETT  Eyes: Anicteric, PERRL  Neck: Supple, trachea midline  Lungs:  PRVC 40%  Heart: tachycardia  Abdomen:  Soft, nontender, +colostomy  Extremities: ++ peripheral edema.  Neurologic: Intubated and sedated  Skin: No lesions  Access: Left femoral temp HD catheter 87/37   Basic Metabolic Panel:  Recent Labs Lab 07/16/2016 1822 07/09/16 0430 07/09/16 1244 07/09/16 1648 07/09/16 2145 07/10/16 0102 07/10/16 0506 07/10/16 0540  NA 138 138 138 138 137 138 137  --   K 4.1 4.3 4.3 4.4 4.3 4.3 4.2  --   CL 114* 108 105 105 104 103 104  --   CO2 19* 20* _0 --   GLUCOSE 111* 97 129* 129* 108* 118* 95  --   BUN 43* 43* 48* 40* 34* 30* 27*  --   CREATININE 1.53* 1.73* 1.73* 1.42* 1.31* 1.10* 0.88  --   CALCIUM 8.0* 7.8* 7.5* 7.6* 7.5* 7.6* 7.7*  --   MG 1.3* 1.7  --   --   --   --   --  1.8  PHOS 3.4 3.3 3.9  3.3 2.8 2.5 2.1*  --     Liver Function Tests:  Recent Labs Lab 07/03/2016 0918 07/04/2016 1822 07/09/16 1244 07/09/16 1648 07/09/16 2145 07/10/16 0102 07/10/16 0506  AST 76* 123*  --   --   --   --   --   ALT 46 77*  --   --   --   --   --   ALKPHOS 92 77  --   --   --   --   --   BILITOT 0.5 0.5  --   --   --   --   --   PROT 5.6* 5.1*  --   --   --   --   --   ALBUMIN 2.3* 2.0* 1.8* 1.9* 1.9* 1.8* 1.8*   No results for input(s): LIPASE, AMYLASE in the last 168 hours. No results for input(s): AMMONIA in the last 168 hours.  CBC:  Recent Labs Lab 06/30/2016 0918 07/05/2016 1822 07/09/16 0430 07/10/16 0506  WBC 5.9 19.9* 16.1* 17.7*  NEUTROABS 5.7 18.5*  --   --   HGB 9.1* 8.0* 7.9* 8.0*  HCT 29.0* 26.6* 25.2*  25.1*  MCV 79.3* 80.2 78.3* 76.8*  PLT 143* 138* 104* 79*    Cardiac Enzymes:  Recent Labs Lab 07/02/2016 0918  TROPONINI 0.06*    BNP: Invalid input(s): POCBNP  CBG:  Recent Labs Lab 07/09/16 2324 07/09/16 2347 07/10/16 0052 07/10/16 0411 07/10/16 0733  GLUCAP 60* 144* 112* 91 7    Microbiology: Results for orders placed or performed during the hospital encounter of 07/04/2016  Blood Culture (routine x 2)     Status: None (Preliminary result)   Collection Time: 07/04/2016  9:18 AM  Result Value Ref Range Status   Specimen Description BLOOD LEFT HAND  Final   Special Requests   Final    BOTTLES DRAWN AEROBIC AND ANAEROBIC  AER [place] ANA 5CC   Culture  Setup Time   Final    GRAM NEGATIVE RODS AEROBIC BOTTLE ONLY CRITICAL RESULT CALLED TO, READ BACK BY AND VERIFIED WITH: JASON ROBBINS [place] AT [place] 07/09/16 MSS. GRAM POSITIVE COCCI ANAEROBIC BOTTLE ONLY CRITICAL RESULT CALLED TO, READ BACK BY AND VERIFIED WITH: JASON ROBBINS AT 1194 ON 07/09/16.Mar[facility] KitchenMar[facility]Ring[facility] BY [facility]    Culture   Final    CULTURE REINCUBATED FOR BETTER GROWTH Performed at [facility]    Report Status PENDING  Incomplete  Blood Culture (routine x 2)     Status: None  (Preliminary result)   Collection Time: 06/25/2016  9:18 AM  Result Value Ref Range Status   Specimen Description BLOOD RIGHT HAND  Final   Special Requests BOTTLES DRAWN AEROBIC AND ANAEROBIC  5CC  Final   Culture  Setup Time   Final    GRAM NEGATIVE RODS AEROBIC BOTTLE ONLY CRITICAL VALUE NOTED.  VALUE IS CONSISTENT WITH PREVIOUSLY REPORTED AND CALLED VALUE. JASON ROBBINS [place] AT 0025 07/09/16 MSS. CONFIRMED BY [facility]    Culture NO GROWTH 2 DAYS  Final   Report Status PENDING  Incomplete  Urine culture     Status: Abnormal   Collection Time: 07/09/2016  9:18 AM  Result Value Ref Range Status   Specimen Description URINE, RANDOM  Final   Special Requests NONE  Final   Culture (A)  Final    >=100,000 COLONIES/mL ESCHERICHIA COLI Confirmed Extended Spectrum Beta-Lactamase Producer (ESBL) Performed at [facility]    Report Status 07/10/2016 FINAL  Final   Organism ID, Bacteria ESCHERICHIA COLI (A)  Final      Susceptibility   Escherichia coli - MIC*    AMPICILLIN >=32 RESISTANT Resistant     CEFAZOLIN >=64 RESISTANT Resistant     CEFTRIAXONE >=64 RESISTANT Resistant     CIPROFLOXACIN >=4 RESISTANT Resistant     GENTAMICIN <=1 SENSITIVE Sensitive     IMIPENEM <=0.25 SENSITIVE Sensitive     NITROFURANTOIN <=16 SENSITIVE Sensitive     TRIMETH/SULFA >=320 RESISTANT Resistant     AMPICILLIN/SULBACTAM 4 SENSITIVE Sensitive     PIP/TAZO <=4 SENSITIVE Sensitive     Extended ESBL POSITIVE Resistant     * >=100,000 COLONIES/mL ESCHERICHIA COLI  Blood Culture ID Panel (Reflexed)     Status: Abnormal   Collection Time: 07/07/2016  9:18 AM  Result Value Ref Range Status   Enterococcus species NOT DETECTED NOT DETECTED Final   Vancomycin resistance NOT DETECTED NOT DETECTED Final   Listeria monocytogenes NOT DETECTED NOT DETECTED Final   Staphylococcus species NOT DETECTED NOT DETECTED Final   Staphylococcus aureus NOT DETECTED NOT DETECTED Final   Methicillin resistance NOT DETECTED NOT  DETECTED Final   Streptococcus  species NOT DETECTED NOT DETECTED Final   Streptococcus agalactiae NOT DETECTED NOT DETECTED Final   Streptococcus pneumoniae NOT DETECTED NOT DETECTED Final   Streptococcus pyogenes NOT DETECTED NOT DETECTED Final   Acinetobacter baumannii NOT DETECTED NOT DETECTED Final   Enterobacteriaceae species NOT DETECTED NOT DETECTED Final   Enterobacter cloacae complex NOT DETECTED NOT DETECTED Final   Escherichia coli DETECTED (A) NOT DETECTED Final    Comment: CRITICAL RESULT CALLED TO, READ BACK BY AND VERIFIED WITH: JASON ROBBINS [place] AT 0025 07/09/16 MSS.    Klebsiella oxytoca NOT DETECTED NOT DETECTED Final   Klebsiella pneumoniae NOT DETECTED NOT DETECTED Final   Proteus species NOT DETECTED NOT DETECTED Final   Serratia marcescens NOT DETECTED NOT DETECTED Final   Carbapenem resistance NOT DETECTED NOT DETECTED Final   Haemophilus influenzae NOT DETECTED NOT DETECTED Final   Neisseria meningitidis NOT DETECTED NOT DETECTED Final   Pseudomonas aeruginosa NOT DETECTED NOT DETECTED Final   Candida albicans NOT DETECTED NOT DETECTED Final   Candida glabrata NOT DETECTED NOT DETECTED Final   Candida krusei NOT DETECTED NOT DETECTED Final   Candida parapsilosis NOT DETECTED NOT DETECTED Final   Candida tropicalis NOT DETECTED NOT DETECTED Final  Blood Culture ID Panel (Reflexed)     Status: Abnormal   Collection Time: 07/12/2016  9:18 AM  Result Value Ref Range Status   Enterococcus species NOT DETECTED NOT DETECTED Final   Vancomycin resistance NOT DETECTED NOT DETECTED Final   Listeria monocytogenes NOT DETECTED NOT DETECTED Final   Staphylococcus species DETECTED (A) NOT DETECTED Final    Comment: CRITICAL RESULT CALLED TO, READ BACK BY AND VERIFIED WITH: JASON ROBBINS AT 8676 ON 07/09/16.Marland KitchenMarland KitchenJames [place]    Staphylococcus aureus NOT DETECTED NOT DETECTED Final   Methicillin resistance NOT DETECTED NOT DETECTED Final   Streptococcus species DETECTED (A) NOT DETECTED  Final    Comment: CRITICAL RESULT CALLED TO, READ BACK BY AND VERIFIED WITH: JASON ROBBINS AT 1950 ON 07/09/16.Marland KitchenMarland KitchenNorth [place]    Streptococcus agalactiae NOT DETECTED NOT DETECTED Final   Streptococcus pneumoniae NOT DETECTED NOT DETECTED Final   Streptococcus pyogenes NOT DETECTED NOT DETECTED Final   Acinetobacter baumannii NOT DETECTED NOT DETECTED Final   Enterobacteriaceae species NOT DETECTED NOT DETECTED Final   Enterobacter cloacae complex NOT DETECTED NOT DETECTED Final   Escherichia coli NOT DETECTED NOT DETECTED Final   Klebsiella oxytoca NOT DETECTED NOT DETECTED Final   Klebsiella pneumoniae NOT DETECTED NOT DETECTED Final   Proteus species NOT DETECTED NOT DETECTED Final   Serratia marcescens NOT DETECTED NOT DETECTED Final   Carbapenem resistance NOT DETECTED NOT DETECTED Final   Haemophilus influenzae NOT DETECTED NOT DETECTED Final   Neisseria meningitidis NOT DETECTED NOT DETECTED Final   Pseudomonas aeruginosa NOT DETECTED NOT DETECTED Final   Candida albicans NOT DETECTED NOT DETECTED Final   Candida glabrata NOT DETECTED NOT DETECTED Final   Candida krusei NOT DETECTED NOT DETECTED Final   Candida parapsilosis NOT DETECTED NOT DETECTED Final   Candida tropicalis NOT DETECTED NOT DETECTED Final  MRSA PCR Screening     Status: None   Collection Time: 06/24/2016  1:40 PM  Result Value Ref Range Status   MRSA by PCR NEGATIVE NEGATIVE Final    Comment:        The GeneXpert MRSA Assay (FDA approved for NASAL specimens only), is one component of a comprehensive MRSA colonization surveillance program. It is not intended to diagnose MRSA infection nor to guide or monitor treatment for  MRSA infections.   Urine culture     Status: Abnormal (Preliminary result)   Collection Time: 07/03/2016  3:18 PM  Result Value Ref Range Status   Specimen Description URINE, RANDOM LEFT RENAL URINE CULTURE  Final   Special Requests NONE  Final   Culture >=100,000 COLONIES/mL ESCHERICHIA COLI  (A)  Final   Report Status PENDING  Incomplete    Coagulation Studies: No results for input(s): LABPROT, INR in the last 72 hours.  Urinalysis:  Recent Labs  07/02/2016 0918  COLORURINE YELLOW*  LABSPEC 1.013  PHURINE 5.0  GLUCOSEU NEGATIVE  HGBUR NEGATIVE  BILIRUBINUR NEGATIVE  KETONESUR NEGATIVE  PROTEINUR 100*  NITRITE NEGATIVE  LEUKOCYTESUR 3+*      Imaging: Dg Abd 1 View  Result Date: 07/09/2016 CLINICAL DATA:  Orogastric tube placement EXAM: ABDOMEN - 1 VIEW COMPARISON:  Earlier today FINDINGS: An orogastric tube tip and side-port overlaps the proximal stomach that is moderately distended. Left ureteral stent as noted previously. Stoma seen over the left abdomen. Known layering right pleural effusion. IMPRESSION: Orogastric tube tip over the proximal stomach which remains moderately distended by gas. Electronically Signed   By: Monte Fantasia M.D.   On: 07/09/2016 04:01   Dg Abd 1 View  Result Date: 07/09/2016 CLINICAL DATA:  Left ureteral stent placement.  Initial encounter. EXAM: ABDOMEN - 1 VIEW COMPARISON:  CT of the abdomen and pelvis performed 07/01/2016 FINDINGS: The visualized bowel gas pattern is unremarkable. Scattered air and stool filled loops of colon are seen; no abnormal dilatation of small bowel loops is seen to suggest small bowel obstruction. No free intra-abdominal air is identified, though evaluation for free air is limited on supine views. The stomach is diffusely distended with air, raising question for mild gastroparesis. A left ureteral stent is noted overlying expected position. The visualized osseous structures are within normal limits; the sacroiliac joints are unremarkable in appearance. Lumbar spinal fusion hardware is noted at L3-L5. Scattered vascular calcifications are seen. IMPRESSION: 1. Left ureteral stent noted overlying expected position. 2. Stomach diffusely distended with air. Would correlate clinically to exclude mild gastroparesis. 3.  Otherwise unremarkable bowel gas pattern; no free intra-abdominal air seen. 4. Scattered vascular calcifications seen. Electronically Signed   By: Garald Balding M.D.   On: 07/09/2016 02:59   Dg Chest Port 1 View  Result Date: 07/10/2016 CLINICAL DATA:  Hypoxia EXAM: PORTABLE CHEST 1 VIEW COMPARISON:  July 09, 2016 FINDINGS: Endotracheal tube tip is 3.5 cm above the carina. Nasogastric tube tip and side port below the diaphragm. Central catheter tip is in the superior vena cava. No pneumothorax. There are bilateral pleural effusions with bibasilar atelectatic change. Heart is enlarged with mild pulmonary venous hypertension. No adenopathy evident. Patient is status post coronary artery bypass grafting. There is atherosclerotic calcification in the aorta. IMPRESSION: Tube and catheter positions as described without pneumothorax. Evidence of congestive heart failure. No airspace consolidation appreciable. There is aortic atherosclerosis. Electronically Signed   By: Lowella Grip III M.D.   On: 07/10/2016 08:07   Dg Chest Port 1 View  Result Date: 07/09/2016 CLINICAL DATA:  Postoperative radiograph, status post left ureteral stent placement. Initial encounter. EXAM: PORTABLE CHEST 1 VIEW COMPARISON:  Chest radiograph performed 07/19/2016 FINDINGS: The patient's endotracheal tube is seen ending 2-3 cm above the carina. A right IJ line is noted ending about the mid SVC. The lungs are hypoexpanded. A small right pleural effusion is noted, less prominent than on the prior study. Vascular crowding and  vascular congestion are seen. Increased interstitial markings raise question for mild interstitial edema. The cardiomediastinal silhouette is borderline normal in size. The patient is status post median sternotomy, with evidence of prior CABG. There is chronic fracture of multiple sternal wires. No acute osseous abnormalities are seen. IMPRESSION: 1. Endotracheal tube seen ending 2-3 cm above the carina. 2. Lungs  hypoexpanded. Small right pleural effusion is less prominent than on the prior study. Vascular congestion noted. Increased interstitial markings raise question for mild interstitial edema. Electronically Signed   By: Garald Balding M.D.   On: 07/09/2016 03:30   Dg Chest Port 1 View  Result Date: 07/02/2016 CLINICAL DATA:  Status post intubation EXAM: PORTABLE CHEST 1 VIEW COMPARISON:  06/27/2016 FINDINGS: Cardiac shadow remains enlarged. Postsurgical changes are again seen. Endotracheal tube is now seen 2.9 cm above the carina in satisfactory position. A right jugular central line is again noted and stable. Increasing right-sided pleural effusion is noted. There is likely some underlying atelectasis present IMPRESSION: Increasing right-sided pleural effusion. Status post intubation in satisfactory position. Electronically Signed   By: Inez Catalina M.D.   On: 07/19/2016 16:56   Dg Chest Portable 1 View  Result Date: 07/19/2016 CLINICAL DATA:  Status post central line placement. EXAM: PORTABLE CHEST 1 VIEW COMPARISON:  07/06/2016 at 9:11 a.m. FINDINGS: Right internal jugular central venous line catheter tip projects in the mid superior vena cava. No pneumothorax. Right-sided pleural effusion and mild interstitial thickening is stable. No new lung abnormalities. IMPRESSION: New right internal jugular central venous line tip projects in the mid superior vena cava. No pneumothorax. No other change from the earlier study. Electronically Signed   By: Lajean Manes M.D.   On: 07/14/2016 11:42   Ct Renal Stone Study  Result Date: 07/09/2016 CLINICAL DATA:  Altered mental status with lethargy. Reported recent unknown kidney stone procedure. EXAM: CT ABDOMEN AND PELVIS WITHOUT CONTRAST TECHNIQUE: Multidetector CT imaging of the abdomen and pelvis was performed following the standard protocol without IV contrast. COMPARISON:  CT 11/29/2015.  Limited abdominal ultrasound 05/26/2016 FINDINGS: Lower chest: Moderate to  large right and small left pleural effusions are similar to the prior examination. There is no pericardial effusion. Patient is status post median sternotomy. There is atherosclerosis of the aorta and coronary arteries. Subtotal collapse of the right lower lobe and lesser atelectasis in the right middle and left lower lobes are similar to the prior study. Hepatobiliary: Morphologic changes of cirrhosis are again noted. No focal hepatic abnormalities are demonstrated on noncontrast imaging. Prior cholecystectomy. The biliary system appears grossly unchanged. Pancreas: The pancreas is atrophied without apparent focal abnormality or surrounding inflammation. Spleen: Normal in size without focal abnormality. Adrenals/Urinary Tract: Stable 2.4 x 1.8 cm left adrenal nodule on image 32. The right adrenal gland appears normal. Nonobstructing small renal calculi on the right are unchanged. There is no right-sided hydronephrosis. There is a 14 mm calculus in the lower pole of the left kidney. There is new left-sided hydronephrosis and hydroureter with associated asymmetric perinephric soft tissue stranding. There are obstructing calculi in the distal left ureter, measuring up to 9 mm on image 86. There are additional smaller stone fragments in the left ureter, best seen on the reformatted images. There are also small right-sided bladder calculi in. The bladder remains decompressed by a Foley catheter. Stomach/Bowel: Descending colostomy noted. The colon is decompressed. No bowel wall thickening, significant distention or focal surrounding inflammatory change identified. No residual herniated bowel seen. Vascular/Lymphatic: There are several  prominent retroperitoneal lymph nodes which are similar to the prior examination. There is diffuse aortic and branch vessel atherosclerosis. Reproductive: The uterus and adnexa appear unchanged. Other: There is a moderate amount of ascites which has increased in volume compared with the  prior study. There are multiple hernias of the lower anterior abdominal wall containing fat and ascites. No residual herniated bowel. There is some soft tissue edema throughout the abdominal wall which is asymmetric to the right. Musculoskeletal: No acute or significant osseous findings. Old rib fractures are noted on the right. There are degenerative and postsurgical changes in the lumbar spine. IMPRESSION: 1. There are several obstructing calculi and stone fragments within the distal left ureter with associated hydronephrosis. 2. Bilateral renal calculi and small bladder calculi are also noted. 3. Hepatic cirrhosis with increasing ascites. Right-greater-than-left pleural effusions have not significantly changed in volume. 4. Stable postsurgical changes status post descending colostomy. Multiple lower abdominal wall hernias are grossly unchanged. 5. Aortic atherosclerosis. 6. Stable left adrenal adenoma. Electronically Signed   By: Richardean Sale M.D.   On: 06/28/2016 13:11     Medications:   . fentaNYL infusion INTRAVENOUS 10 mcg/hr (07/10/16 0913)  . norepinephrine (LEVOPHED) Adult infusion 11 mcg/min (07/10/16 0559)  . pureflow 2,500 mL/hr at 07/09/16 1852  .  sodium bicarbonate 150 mEq in sterile water 1000 mL infusion 75 mL/hr at 07/10/16 0841  . vasopressin (PITRESSIN) infusion - *FOR SHOCK* 0.02 Units/min (07/09/16 2015)   . antiseptic oral rinse  7 mL Mouth Rinse Q2H  . budesonide (PULMICORT) nebulizer solution  0.5 mg Nebulization BID  . chlorhexidine gluconate (SAGE KIT)  15 mL Mouth Rinse BID  . famotidine (PEPCID) IV  20 mg Intravenous Q24H  . heparin subcutaneous  5,000 Units Subcutaneous Q8H  . hydrocortisone sod succinate (SOLU-CORTEF) inj  50 mg Intravenous Q6H  . insulin aspart  2-6 Units Subcutaneous Q4H  . ipratropium-albuterol  3 mL Nebulization Q6H  . meropenem (MERREM) IV  1 g Intravenous Q8H  . vancomycin  1,250 mg Intravenous Q24H   sodium chloride, acetaminophen,  acetaminophen, fentaNYL (SUBLIMAZE) injection, fentaNYL (SUBLIMAZE) injection, heparin, [START ON 07/14/2016] ipratropium-albuterol, midazolam, ondansetron (ZOFRAN) IV  Assessment/ Plan:  Suzanne Ewing is a 70 y.o. white female with coronary artery disease, diabetes mellitus type 2, GERD, hypertension, history of necrotizing fasciitis, colostomy, nephrolithiasis with recent lithotripsy who was admitted to [facility] on 07/13/2016   1.  Acute renal failure with metabolic acidosis due to ATN from septic shock N17.0:  Anuric.Baseline creatinine 0.7. Placed on emergent renal replacement therapy.  Prognosis poor.  - discontinue bicarb gtt.  2. Septic shock with Escherichia coli urinary tract infection. Leukocytosis  - vanco and merepenem empirically.   3. Acute respiratory failure. Continue ventilatory support. FiO2 currently 40%.   LOS: [place], [place] 8/21/20179:39 AM

## 2016-07-10 NOTE — Progress Notes (Signed)
Pharmacy Antibiotic Note  Suzanne Ewing is a 70 y.o. female admitted on 06/25/2016 with sepsis.  Pharmacy has been consulted for meropenem and vancomycin dosing. Patient has recent history, July 2017, of ESBL UTI and has tolerated carbapenems in the past. Patient now requiring CRRT and mechanical ventilation.    Plan: Continue meropenem 1g IV Q8hr.    After discussion with Dr. Ashby Dawes, will d/c vancomycin.   Height: 5\' 6"  (167.6 cm) Weight: 218 lb 7.6 oz (99.1 kg) IBW/kg (Calculated) : 59.3  Temp (24hrs), Avg:97.2 F (36.2 C), Min:96.8 F (36 C), Max:97.6 F (36.4 C)   Recent Labs Lab 07/13/2016 0918 06/30/2016 1408 06/22/2016 1822 07/09/16 0430 07/09/16 0904  07/09/16 1648 07/09/16 2145 07/10/16 0102 07/10/16 0506 07/10/16 1032  WBC 5.9  --  19.9* 16.1*  --   --   --   --   --  17.7*  --   CREATININE 1.50*  --  1.53* 1.73*  --   < > 1.42* 1.31* 1.10* 0.88 0.91  LATICACIDVEN 5.1* 4.4*  --  3.0* 2.7*  --  1.7  --   --   --   --   < > = values in this interval not displayed.  Estimated Creatinine Clearance: 68.3 mL/min (by C-G formula based on SCr of 0.91 mg/dL).    Allergies  Allergen Reactions  . Macrobid [Nitrofurantoin Monohyd Macro] Itching, Rash and Swelling    PT HOSPITALIZED  . Penicillins Shortness Of Breath, Rash and Other (See Comments)    Has patient had a PCN reaction causing immediate rash, facial/tongue/throat swelling, SOB or lightheadedness with hypotension: Yes Has patient had a PCN reaction causing severe rash involving mucus membranes or skin necrosis: No Has patient had a PCN reaction that required hospitalization Yes Has patient had a PCN reaction occurring within the last 10 years: No If all of the above answers are "NO", then may proceed with Cephalosporin use.   . Tape Rash  . Azithromycin Other (See Comments)    Reaction: unknown   . Clarithromycin Other (See Comments)    Abdominal pain  . Nitroglycerin Other (See Comments)    Reaction -  unknown  . Leflunomide Rash  . Morphine Nausea And Vomiting    Nausea and vomiting  . Naproxen Rash    Antimicrobials this admission: Levofloxacin 8/19 >> 8/19 Vancomycin 8/19 >> 8/21 Meropenem 8/19 >>   Dose adjustments this admission: Aztreonam ordered and discontinued by provider prior to administration.   Microbiology results: 8/19 BCx E coli 2/2, Staph and Strep in 1/2 8/19 UCx: ESBL E coli 8/19 MRSA PCR: negative  Pharmacy will continue to monitor and adjust per consult.     Ulice Dash D 07/10/2016 1:01 PM

## 2016-07-10 NOTE — Progress Notes (Signed)
[facility] Progess Note    ASSESSMENT/PLAN   DISCUSSION: 70 yo F with recent left-sided lithotripsy, history of recurrent UTI, MRSA, gas gangren, diabetes, CAD, hyperlipidemia, now s/p lithotripsy with septic shock-source UTI, severe metabolic\lactic acidosis, E.coli bacteremia.   ASSESSMENT / PLAN:  PULMONARY Acute hypoxemic respiratory failure secondary to septic shock.  Severe metabolic acidosis Right-sided loculated pleural effusion-chronic; last thoracentesis 11/30/2015, no growth.  H/O COPD P: Full vent support with current settings Maintain O2 sat>88% WA when stable Pulmicort nebs BID PRN Duonebs Will consider repeat thoracentesis if effusions worsens/sepsis persists or unable to wean from vent.  F/u ABG and CXR  ABF 8/21; 7/32/54/73/27.8  Cardiovascular A: Septic shock-due to UTI Hx of Hypertension Afib-ECHO 11/2015>EF 40-45%, LVH, sys CHF, mild MR,  mild LA dilation History of coronary artery disease Peripheral vascular disease P: Continue to titrate levophed to keep MAP 65 and above Wean pressors as tolerated Hold beta blockers for now given hypotension Hold lasix, diltiazem and all home BP meds in light of shock continue solu-cortef, taper as BP improves.   RENAL Acute Left hydronephrosis - seen intraoperatively (See urology note) Hx of left Renal calculi status post left-sided lithotripsy with ureteral stent placement Oliguria s/p left ureteral stent placement with worsening creatinine-creatinine improved with CRRT Recurrent UTI History of chronic indwelling catheter Acute renal failure Metabolic Acidosis/Lactic Acidosis-Lactic acidosis resolved P: Monitor BMP, and Cr Avoid nephrotoxic drugs Irrigate foley prn Nephrology following, CRRT per nephrology    GI A: H/O GERD S/p L colostomy Bag placement P: Cont with GI prophylaxis Colostomy care  INFECTIOUS DISEASE Septic shock-urinary source Recurrent  UTI Bacteremia-GNR P: F/U cultures Follow-up pro-calcitonin Abx as stated above  ENDOCRINE A: Type 2 DM P: ICU hypo/hyperglycemia protocol SSI  NEURO AMS Acute metabolic Encephalopathy related to underlying infection A: Avoid sedative drugs if possible Cont to monitor neuro status.  Continue current treatment for acidosis and sepsis    Best Practices  DVT Prophylaxis: Heparin sQ.  GI Prophylaxis: Famotidine.   SIGNIFICANT EVENTS: 8/8>left ureteral calculi, bladder calculi, cystoscopy with uroscopy, lithotripsy and indwelling stent inserted on the left, removal of bladder stone, less than 2.5 cm, lithotripsy including insertion of indwelling urethral stent, crush calculus and removal of fragments. 8/15>urology follow-up, complaining of voiding issues, UA with 3+ nitrites 8/17>family states patient incompletely able to void, urology ordered catheterized urine culture 8/19>septic shock, urgent L ureteral stent, 20cc of cloud fluid removed from L ureter by urology, worsening shock, ETT from OR, bicarb gtt 8/20>GNR bacteremia, septic shock, remains ETT,  ---------------------------------------  Advanced Care Planning Note: Pt's condition discussed in presence of patient with daughter, son, husband, sister. Husband notes that patient never wanted to be put on life support, she had chronic illness at her nursing home and requires assistance with almost all daily activities. Discussed with family that patient is now critically ill and is on life support.  Her condition is critical and appears to have stabilized, thought explained that her condition could worsen at any time, and can do so quickly.  They appear uncertain about making decision about EOL, and do not appear to have considered this. Therefore at this time, we will continue current measures, I discussed with them about changing her code status, they will discuss this amongst themselves, and then let us know of their  decision.  30 min spent in ACP activities.  ----------------------------------------   Name: Marybelle Allbright MRN: [id number] DOB: Jan 06, 1946    ADMISSION DATE:  07/06/2016  SUBJECTIVE:   Pt currently on the ventilator, can not provide history or review of systems.   Review of Systems:  --   VITAL SIGNS: Temp:  [96.8 F (36 C)-97.6 F (36.4 C)] 97 F (36.1 C) (08/21 0057) Pulse Rate:  [97-107] 102 (08/21 1100) Resp:  [0-30] 30 (08/21 1100) BP: (82-122)/(45-71) 103/54 (08/21 1100) SpO2:  [97 %-100 %] 100 % (08/21 1100) FiO2 (%):  [40 %-50 %] 40 % (08/21 0900) Weight:  [218 lb 7.6 oz (99.1 kg)] 218 lb 7.6 oz (99.1 kg) (08/21 0415) HEMODYNAMICS: CVP:  [11 mmHg-15 mmHg] 15 mmHg VENTILATOR SETTINGS: Vent Mode: PRVC FiO2 (%):  [40 %-50 %] 40 % Set Rate:  [30 bmp] 30 bmp Vt Set:  [400 mL] 400 mL PEEP:  [5 cmH20] 5 cmH20 INTAKE / OUTPUT:  Intake/Output Summary (Last 24 hours) at 07/10/16 1115 Last data filed at 07/10/16 1000  Gross per 24 hour  Intake          7089.36 ml  Output              101 ml  Net          6988.36 ml    PHYSICAL EXAMINATION: Physical Examination:   VS: BP (!) 103/54   Pulse (!) 102   Temp 97 F (36.1 C) (Axillary)   Resp (!) 30   Ht 5\' 6"  (1.676 m)   Wt 218 lb 7.6 oz (99.1 kg)   SpO2 100%   BMI 35.26 kg/m   General Appearance: Unresponsive on the vent.  Neuro:without focal findings, mental status normal. HEENT: PERRLA, EOM intact. Pulmonary: normal breath sounds   CardiovascularNormal S1,S2.  No m/r/g.   Abdomen: Benign, Soft, non-tender. Renal:  No costovertebral tenderness  GU:  Not performed at this time. Endocrine: No evident thyromegaly. Skin:   warm, no rashes, no ecchymosis  Extremities: normal, no cyanosis, clubbing.   LABS:   LABORATORY PANEL:   CBC  Recent Labs Lab 07/10/16 0506  WBC 17.7*  HGB 8.0*  HCT 25.1*  PLT 79*    Chemistries   Recent Labs Lab 07/18/2016 1822  07/10/16 0540 07/10/16 1032    NA 138  < >  --  137  K 4.1  < >  --  4.0  CL 114*  < >  --  103  CO2 19*  < >  --  25  GLUCOSE 111*  < >  --  87  BUN 43*  < >  --  26*  CREATININE 1.53*  < >  --  0.91  CALCIUM 8.0*  < >  --  7.6*  MG 1.3*  < > 1.8  --   PHOS 3.4  < >  --  2.4*  AST 123*  --   --   --   ALT 77*  --   --   --   ALKPHOS 77  --   --   --   BILITOT 0.5  --   --   --   < > = values in this interval not displayed.   Recent Labs Lab 07/09/16 1935 07/09/16 2324 07/09/16 2347 07/10/16 0052 07/10/16 0411 07/10/16 0733  GLUCAP 109* 60* 144* 112* 91 86    Recent Labs Lab 07/09/16 0425 07/09/16 0745 07/10/16 0608  PHART 7.25* 7.24* 7.32*  PCO2ART 43 47 54*  PO2ART 87 70* 73*    Recent Labs Lab 06/29/2016 0918 07/18/2016 1822  07/10/16 0102 07/10/16 0506 07/10/16 1032  AST  76* 123*  --   --   --   --   ALT 46 77*  --   --   --   --   ALKPHOS 92 77  --   --   --   --   BILITOT 0.5 0.5  --   --   --   --   ALBUMIN 2.3* 2.0*  < > 1.8* 1.8* 1.8*  < > = values in this interval not displayed.  Cardiac Enzymes  Recent Labs Lab 07/13/2016 0918  TROPONINI 0.06*    RADIOLOGY:  Dg Abd 1 View  Result Date: 07/09/2016 CLINICAL DATA:  Orogastric tube placement EXAM: ABDOMEN - 1 VIEW COMPARISON:  Earlier today FINDINGS: An orogastric tube tip and side-port overlaps the proximal stomach that is moderately distended. Left ureteral stent as noted previously. Stoma seen over the left abdomen. Known layering right pleural effusion. IMPRESSION: Orogastric tube tip over the proximal stomach which remains moderately distended by gas. Electronically Signed   By: Monte Fantasia M.D.   On: 07/09/2016 04:01   Dg Abd 1 View  Result Date: 07/09/2016 CLINICAL DATA:  Left ureteral stent placement.  Initial encounter. EXAM: ABDOMEN - 1 VIEW COMPARISON:  CT of the abdomen and pelvis performed 06/28/2016 FINDINGS: The visualized bowel gas pattern is unremarkable. Scattered air and stool filled loops of colon are  seen; no abnormal dilatation of small bowel loops is seen to suggest small bowel obstruction. No free intra-abdominal air is identified, though evaluation for free air is limited on supine views. The stomach is diffusely distended with air, raising question for mild gastroparesis. A left ureteral stent is noted overlying expected position. The visualized osseous structures are within normal limits; the sacroiliac joints are unremarkable in appearance. Lumbar spinal fusion hardware is noted at L3-L5. Scattered vascular calcifications are seen. IMPRESSION: 1. Left ureteral stent noted overlying expected position. 2. Stomach diffusely distended with air. Would correlate clinically to exclude mild gastroparesis. 3. Otherwise unremarkable bowel gas pattern; no free intra-abdominal air seen. 4. Scattered vascular calcifications seen. Electronically Signed   By: Garald Balding M.D.   On: 07/09/2016 02:59   Dg Chest Port 1 View  Result Date: 07/10/2016 CLINICAL DATA:  Hypoxia EXAM: PORTABLE CHEST 1 VIEW COMPARISON:  July 09, 2016 FINDINGS: Endotracheal tube tip is 3.5 cm above the carina. Nasogastric tube tip and side port below the diaphragm. Central catheter tip is in the superior vena cava. No pneumothorax. There are bilateral pleural effusions with bibasilar atelectatic change. Heart is enlarged with mild pulmonary venous hypertension. No adenopathy evident. Patient is status post coronary artery bypass grafting. There is atherosclerotic calcification in the aorta. IMPRESSION: Tube and catheter positions as described without pneumothorax. Evidence of congestive heart failure. No airspace consolidation appreciable. There is aortic atherosclerosis. Electronically Signed   By: Lowella Grip III M.D.   On: 07/10/2016 08:07   Dg Chest Port 1 View  Result Date: 07/09/2016 CLINICAL DATA:  Postoperative radiograph, status post left ureteral stent placement. Initial encounter. EXAM: PORTABLE CHEST 1 VIEW  COMPARISON:  Chest radiograph performed 07/10/2016 FINDINGS: The patient's endotracheal tube is seen ending 2-3 cm above the carina. A right IJ line is noted ending about the mid SVC. The lungs are hypoexpanded. A small right pleural effusion is noted, less prominent than on the prior study. Vascular crowding and vascular congestion are seen. Increased interstitial markings raise question for mild interstitial edema. The cardiomediastinal silhouette is borderline normal in size. The patient is status  post median sternotomy, with evidence of prior CABG. There is chronic fracture of multiple sternal wires. No acute osseous abnormalities are seen. IMPRESSION: 1. Endotracheal tube seen ending 2-3 cm above the carina. 2. Lungs hypoexpanded. Small right pleural effusion is less prominent than on the prior study. Vascular congestion noted. Increased interstitial markings raise question for mild interstitial edema. Electronically Signed   By: Garald Balding M.D.   On: 07/09/2016 03:30   Dg Chest Port 1 View  Result Date: 07/15/2016 CLINICAL DATA:  Status post intubation EXAM: PORTABLE CHEST 1 VIEW COMPARISON:  06/22/2016 FINDINGS: Cardiac shadow remains enlarged. Postsurgical changes are again seen. Endotracheal tube is now seen 2.9 cm above the carina in satisfactory position. A right jugular central line is again noted and stable. Increasing right-sided pleural effusion is noted. There is likely some underlying atelectasis present IMPRESSION: Increasing right-sided pleural effusion. Status post intubation in satisfactory position. Electronically Signed   By: Inez Catalina M.D.   On: 06/20/2016 16:56   Dg Chest Portable 1 View  Result Date: 07/07/2016 CLINICAL DATA:  Status post central line placement. EXAM: PORTABLE CHEST 1 VIEW COMPARISON:  07/16/2016 at 9:11 a.m. FINDINGS: Right internal jugular central venous line catheter tip projects in the mid superior vena cava. No pneumothorax. Right-sided pleural effusion  and mild interstitial thickening is stable. No new lung abnormalities. IMPRESSION: New right internal jugular central venous line tip projects in the mid superior vena cava. No pneumothorax. No other change from the earlier study. Electronically Signed   By: Lajean Manes M.D.   On: 07/16/2016 11:42   Ct Renal Stone Study  Result Date: 07/16/2016 CLINICAL DATA:  Altered mental status with lethargy. Reported recent unknown kidney stone procedure. EXAM: CT ABDOMEN AND PELVIS WITHOUT CONTRAST TECHNIQUE: Multidetector CT imaging of the abdomen and pelvis was performed following the standard protocol without IV contrast. COMPARISON:  CT 11/29/2015.  Limited abdominal ultrasound 05/26/2016 FINDINGS: Lower chest: Moderate to large right and small left pleural effusions are similar to the prior examination. There is no pericardial effusion. Patient is status post median sternotomy. There is atherosclerosis of the aorta and coronary arteries. Subtotal collapse of the right lower lobe and lesser atelectasis in the right middle and left lower lobes are similar to the prior study. Hepatobiliary: Morphologic changes of cirrhosis are again noted. No focal hepatic abnormalities are demonstrated on noncontrast imaging. Prior cholecystectomy. The biliary system appears grossly unchanged. Pancreas: The pancreas is atrophied without apparent focal abnormality or surrounding inflammation. Spleen: Normal in size without focal abnormality. Adrenals/Urinary Tract: Stable 2.4 x 1.8 cm left adrenal nodule on image 32. The right adrenal gland appears normal. Nonobstructing small renal calculi on the right are unchanged. There is no right-sided hydronephrosis. There is a 14 mm calculus in the lower pole of the left kidney. There is new left-sided hydronephrosis and hydroureter with associated asymmetric perinephric soft tissue stranding. There are obstructing calculi in the distal left ureter, measuring up to 9 mm on image 86. There are  additional smaller stone fragments in the left ureter, best seen on the reformatted images. There are also small right-sided bladder calculi in. The bladder remains decompressed by a Foley catheter. Stomach/Bowel: Descending colostomy noted. The colon is decompressed. No bowel wall thickening, significant distention or focal surrounding inflammatory change identified. No residual herniated bowel seen. Vascular/Lymphatic: There are several prominent retroperitoneal lymph nodes which are similar to the prior examination. There is diffuse aortic and branch vessel atherosclerosis. Reproductive: The uterus and adnexa appear  unchanged. Other: There is a moderate amount of ascites which has increased in volume compared with the prior study. There are multiple hernias of the lower anterior abdominal wall containing fat and ascites. No residual herniated bowel. There is some soft tissue edema throughout the abdominal wall which is asymmetric to the right. Musculoskeletal: No acute or significant osseous findings. Old rib fractures are noted on the right. There are degenerative and postsurgical changes in the lumbar spine. IMPRESSION: 1. There are several obstructing calculi and stone fragments within the distal left ureter with associated hydronephrosis. 2. Bilateral renal calculi and small bladder calculi are also noted. 3. Hepatic cirrhosis with increasing ascites. Right-greater-than-left pleural effusions have not significantly changed in volume. 4. Stable postsurgical changes status post descending colostomy. Multiple lower abdominal wall hernias are grossly unchanged. 5. Aortic atherosclerosis. 6. Stable left adrenal adenoma. Electronically Signed   By: Richardean Sale M.D.   On: 06/27/2016 13:11       --Marda Stalker, MD.  ICU Pager: [phone] [facility] Office Number: [phone]  Patricia Pesa, M.D.  Vilinda Boehringer, M.D.  Merton Border, M.D  07/10/2016   Critical Care  Attestation.  I have personally obtained a history, examined the patient, evaluated laboratory and imaging results, formulated the assessment and plan and placed orders. The Patient requires high complexity decision making for assessment and support, frequent evaluation and titration of therapies, application of advanced monitoring technologies and extensive interpretation of multiple databases. The patient has critical illness that could lead imminently to failure of 1 or more organ systems and requires the highest level of physician preparedness to intervene.  Critical Care Time devoted to patient care services described in this note is 45 minutes and is exclusive of time spent in procedures.

## 2016-07-10 NOTE — Progress Notes (Signed)
Cathflo removed from dialysis access and new cartridge placed in CRRT and patient started back on CVVHD therapy.

## 2016-07-10 NOTE — Progress Notes (Signed)
Arterial catheter does not flush smoothly and high access pressure alarms are alarming with pressure limits increasing therefore blood returned to patient and RN called MD for cathflo.  Dr. Juleen China gave order for cathflo for dialysis catheter.

## 2016-07-10 NOTE — Care Management (Signed)
RNCM consult received and will continue to follow along with CSW as patient is from a SNF-  [facility]. I understand that patient's daughter is POA per nursing staff. I spoke with patient's husband and he states that daughter Claiborne Billings [phone] is POA and copy should be on chart. Patient is at [facility] under long-term care since Sept. 2016. She is not on O2 at facility. She is wheelchair dependent at baseline per husband.

## 2016-07-11 ENCOUNTER — Inpatient Hospital Stay: Payer: Medicare Other

## 2016-07-11 LAB — RENAL FUNCTION PANEL
ALBUMIN: 1.8 g/dL — AB (ref 3.5–5.0)
ANION GAP: 7 (ref 5–15)
Albumin: 1.7 g/dL — ABNORMAL LOW (ref 3.5–5.0)
Albumin: 2.1 g/dL — ABNORMAL LOW (ref 3.5–5.0)
Anion gap: 6 (ref 5–15)
Anion gap: 6 (ref 5–15)
BUN: 17 mg/dL (ref 6–20)
BUN: 15 mg/dL (ref 6–20)
BUN: 14 mg/dL (ref 6–20)
CALCIUM: 7.9 mg/dL — AB (ref 8.9–10.3)
CHLORIDE: 106 mmol/L (ref 101–111)
CHLORIDE: 106 mmol/L (ref 101–111)
CHLORIDE: 105 mmol/L (ref 101–111)
CO2: 25 mmol/L (ref 22–32)
CO2: 25 mmol/L (ref 22–32)
CO2: 26 mmol/L (ref 22–32)
CREATININE: 0.84 mg/dL (ref 0.44–1.00)
CREATININE: 0.68 mg/dL (ref 0.44–1.00)
Calcium: 8 mg/dL — ABNORMAL LOW (ref 8.9–10.3)
Calcium: 8.2 mg/dL — ABNORMAL LOW (ref 8.9–10.3)
Creatinine, Ser: 0.69 mg/dL (ref 0.44–1.00)
GFR calc Af Amer: >60 mL/min (ref >60)
GFR calc Af Amer: >60 mL/min (ref >60)
GFR calc non Af Amer: >60 mL/min (ref >60)
GFR calc non Af Amer: >60 mL/min (ref >60)
GFR calc non Af Amer: >60 mL/min (ref >60)
GLUCOSE: 72 mg/dL (ref 65–99)
GLUCOSE: 88 mg/dL (ref 65–99)
Glucose, Bld: 88 mg/dL (ref 65–99)
PHOSPHORUS: 1.6 mg/dL — AB (ref 2.5–4.6)
POTASSIUM: 4.1 mmol/L (ref 3.5–5.1)
Phosphorus: 1.7 mg/dL — ABNORMAL LOW (ref 2.5–4.6)
Phosphorus: 1.8 mg/dL — ABNORMAL LOW (ref 2.5–4.6)
Potassium: 4.1 mmol/L (ref 3.5–5.1)
Potassium: 4.2 mmol/L (ref 3.5–5.1)
SODIUM: 137 mmol/L (ref 135–145)
Sodium: 138 mmol/L (ref 135–145)
Sodium: 137 mmol/L (ref 135–145)

## 2016-07-11 LAB — CBC
HCT: 26.3 % — ABNORMAL LOW (ref 35.0–47.0)
Hemoglobin: 8.4 g/dL — ABNORMAL LOW (ref 12.0–16.0)
MCH: 24.4 pg — ABNORMAL LOW (ref 26.0–34.0)
MCHC: 31.8 g/dL — ABNORMAL LOW (ref 32.0–36.0)
MCV: 76.6 fL — AB (ref 80.0–100.0)
PLATELETS: 69 10*3/uL — AB (ref 150–440)
RBC: 3.43 MIL/uL — AB (ref 3.80–5.20)
RDW: 18.1 % — ABNORMAL HIGH (ref 11.5–14.5)
WBC: 18.6 10*3/uL — AB (ref 3.6–11.0)

## 2016-07-11 LAB — CULTURE, BLOOD (ROUTINE X 2)

## 2016-07-11 LAB — BLOOD GAS, ARTERIAL
ACID-BASE DEFICIT: 1.3 mmol/L (ref 0.0–2.0)
Allens test (pass/fail): POSITIVE — AB
BICARBONATE: 25.2 meq/L (ref 21.0–28.0)
FIO2: 0.3
O2 SAT: 94.1 %
PATIENT TEMPERATURE: 37
PCO2 ART: 50 mmHg — AB (ref 32.0–48.0)
PO2 ART: 78 mmHg — AB (ref 83.0–108.0)
pH, Arterial: 7.31 — ABNORMAL LOW (ref 7.350–7.450)

## 2016-07-11 LAB — GLUCOSE, CAPILLARY
GLUCOSE-CAPILLARY: 41 mg/dL — AB (ref 65–99)
GLUCOSE-CAPILLARY: 113 mg/dL — AB (ref 65–99)
GLUCOSE-CAPILLARY: 80 mg/dL (ref 65–99)
GLUCOSE-CAPILLARY: 77 mg/dL (ref 65–99)
Glucose-Capillary: 75 mg/dL (ref 65–99)
Glucose-Capillary: 55 mg/dL — ABNORMAL LOW (ref 65–99)

## 2016-07-11 LAB — URINE CULTURE

## 2016-07-11 LAB — MAGNESIUM: MAGNESIUM: 1.7 mg/dL (ref 1.7–2.4)

## 2016-07-11 MED ORDER — GLYCOPYRROLATE 1 MG PO TABS: 1.0000 mg | ORAL_TABLET | ORAL | Status: DC | PRN

## 2016-07-11 MED ORDER — AMIODARONE HCL IN DEXTROSE 360-4.14 MG/200ML-% IV SOLN
60.0000 mg/h | INTRAVENOUS | Status: DC
  Administered 2016-07-11: 60 mg/h via INTRAVENOUS
  Filled 2016-07-11: qty 200

## 2016-07-11 MED ORDER — HALOPERIDOL LACTATE 5 MG/ML IJ SOLN: 0.5000 mg | INTRAMUSCULAR | Status: DC | PRN

## 2016-07-11 MED ORDER — MIDAZOLAM HCL 2 MG/2ML IJ SOLN: 1.0000 mg | INTRAMUSCULAR | Status: DC | PRN

## 2016-07-11 MED ORDER — DEXTROSE 50 % IV SOLN
25.0000 g | Freq: Once | INTRAVENOUS | Status: AC
  Administered 2016-07-11: 25 g via INTRAVENOUS

## 2016-07-11 MED ORDER — AMIODARONE HCL IN DEXTROSE 360-4.14 MG/200ML-% IV SOLN
30.0000 mg/h | INTRAVENOUS | Status: DC
  Filled 2016-07-11: qty 200

## 2016-07-11 MED ORDER — HALOPERIDOL 0.5 MG PO TABS
0.5000 mg | ORAL_TABLET | ORAL | Status: DC | PRN
  Filled 2016-07-11: qty 1

## 2016-07-11 MED ORDER — ONDANSETRON HCL 4 MG/2ML IJ SOLN
4.0000 mg | Freq: Four times a day (QID) | INTRAMUSCULAR | Status: DC | PRN
Start: 2016-07-11 — End: 2016-07-11

## 2016-07-11 MED ORDER — FENTANYL 2500MCG IN NS 250ML (10MCG/ML) PREMIX INFUSION: 25.0000 ug/h | INTRAVENOUS | Status: DC

## 2016-07-11 MED ORDER — DEXTROSE 5 % IV SOLN
30.0000 mmol | Freq: Once | INTRAVENOUS | Status: DC
  Administered 2016-07-11: 30 mmol via INTRAVENOUS
  Filled 2016-07-11: qty 10

## 2016-07-11 MED ORDER — GLYCOPYRROLATE 0.2 MG/ML IJ SOLN: 0.2000 mg | INTRAMUSCULAR | Status: DC | PRN

## 2016-07-11 MED ORDER — FENTANYL CITRATE (PF) 100 MCG/2ML IJ SOLN: 25.0000 ug | INTRAMUSCULAR | Status: DC | PRN

## 2016-07-11 MED ORDER — ACETAMINOPHEN 650 MG RE SUPP: 650.0000 mg | Freq: Four times a day (QID) | RECTAL | Status: DC | PRN

## 2016-07-11 MED ORDER — AMIODARONE LOAD VIA INFUSION
150.0000 mg | INTRAVENOUS | Status: AC
  Administered 2016-07-11: 150 mg via INTRAVENOUS
  Filled 2016-07-11: qty 83.34

## 2016-07-11 MED ORDER — FENTANYL BOLUS VIA INFUSION
25.0000 ug | INTRAVENOUS | Status: DC | PRN
  Filled 2016-07-11: qty 25

## 2016-07-11 MED ORDER — HALOPERIDOL LACTATE 2 MG/ML PO CONC
0.5000 mg | ORAL | Status: DC | PRN
  Filled 2016-07-11: qty 0.3

## 2016-07-11 MED ORDER — DEXTROSE 50 % IV SOLN
INTRAVENOUS | Status: AC
  Filled 2016-07-11: qty 50

## 2016-07-11 MED ORDER — ACETAMINOPHEN 325 MG PO TABS: 650.0000 mg | ORAL_TABLET | Freq: Four times a day (QID) | ORAL | Status: DC | PRN

## 2016-07-11 MED ORDER — ONDANSETRON 4 MG PO TBDP
4.0000 mg | ORAL_TABLET | Freq: Four times a day (QID) | ORAL | Status: DC | PRN
  Filled 2016-07-11: qty 1

## 2016-07-11 MED ORDER — POLYVINYL ALCOHOL 1.4 % OP SOLN
1.0000 [drp] | Freq: Four times a day (QID) | OPHTHALMIC | Status: DC | PRN
  Filled 2016-07-11: qty 15

## 2016-07-11 MED ORDER — BIOTENE DRY MOUTH MT LIQD: 15.0000 mL | OROMUCOSAL | Status: DC | PRN

## 2016-07-12 ENCOUNTER — Telehealth: Payer: Self-pay | Admitting: Urology

## 2016-07-12 NOTE — Telephone Encounter (Signed)
-----   Message from Lestine Box, LPN sent at [id number] 10:11 AM EDT ----- Regarding: FW: f/u   ----- Message ----- From: Ardis Hughs, MD Sent: 07/05/2016   3:20 PM To: Lestine Box, LPN Subject: f/u                                            PAtient needs to be scheduled for follow-up after hospitalization for septic shock for a left distal ureteral stone. Thanks, b

## 2016-07-13 LAB — CULTURE, BLOOD (ROUTINE X 2)

## 2016-07-21 NOTE — Discharge Summary (Signed)
  Admit date: 07/05/2016 Discharge date: 07-30-16  Admission diagnosis: Severe septic shock.] Discharge diagnosis; same.   Dischare summary:  Pt admitted and intubated due to severe septic shock, she was in renal failure and was put on CRRT. Her acidosis improved, but the patient remained minimally responsive, she remained hypotensive on levophed.   Therefore the family decided to withdraw support and patient to pass naturally.   Marda Stalker, M.D.  July 30, 2016

## 2016-07-21 NOTE — Progress Notes (Signed)
Extubated to comfort care at 1410 by Angie, RRT.  Family at bedside including patient's husband, daughter and 2 sons.  Patient remains on fentanyl drip per Dr. Mathis Fare order for comfort.  Report given to Margreta Journey, RN who is now taking over patient care.

## 2016-07-21 NOTE — Progress Notes (Signed)
Pt time of death 1500. Family at bedside. [facility] notified. Dr. Alva Garnet notified. Estill Llerena E 3:20 PM July 28, 2016

## 2016-07-21 NOTE — Progress Notes (Signed)
Chaplain rounded the unit to provide a compassionate presence and support to the family of the patient who has been placed on comfort care. Suzanne Ewing [phone]

## 2016-07-21 NOTE — Progress Notes (Signed)
Spoke with Jinny Blossom from [facility] and per [facility], patient is not suitable for donation.  Nurse to call CDS back with cardiac time of death.

## 2016-07-21 NOTE — Progress Notes (Signed)
Inpatient Diabetes Program Recommendations  AACE/ADA: New Consensus Statement on Inpatient Glycemic Control (2015)  Target Ranges:  Prepandial:   less than 140 mg/dL      [facility] postprandial:   less than 180 mg/dL (1-2 hours)      Critically ill patients:  140 - 180 mg/dL   Lab Results  Component Value Date   GLUCAP 80 2016-07-30   HGBA1C 6.2 (H) 12/02/2015    Review of Glycemic Control  Results for CYRIL, CALENDER (MRN [id number]) as of 07-30-16 09:30  Ref. Range 07-30-16 00:43 2016-07-30 04:05 Jul 30, 2016 04:08 07-30-2016 04:37 07/30/2016 07:21  Glucose-Capillary Latest Ref Range: 65 - 99 mg/dL 75 41 (LL) 55 (L) 113 (H) 80    Novolog 2-6 units q4h.  Agree with current orders for blood sugar management.  Gentry Fitz, RN, BA, MHA, CDE Diabetes Coordinator Inpatient Diabetes Program  [phone] (Team Pager) [phone] ([place]) 07-30-2016 9:31 AM

## 2016-07-21 NOTE — Progress Notes (Signed)
Family had asked to speak with Dr. Ashby Dawes.  Patient's husband, daughter and 2 sons in room speaking with MD at this time.

## 2016-07-21 NOTE — Progress Notes (Signed)
[facility]  ROUNDING NOTE   Subjective:   Critically ill.   CRRT - UF at 50  Family at bedside   Objective:  Vital signs in last 24 hours:  Temp:  [96.8 F (36 C)-97.5 F (36.4 C)] 97.5 F (36.4 C) (08/22 0708) Pulse Rate:  [37-110] 100 (08/22 0830) Resp:  [0-30] 16 (08/22 0830) BP: (77-149)/(41-87) 118/61 (08/22 0830) SpO2:  [83 %-100 %] 100 % (08/22 0830) FiO2 (%):  [30 %-50 %] 50 % (08/22 0825) Weight:  [99.7 kg (219 lb 12.8 oz)] 99.7 kg (219 lb 12.8 oz) (08/22 0500)  Weight change: 0.6 kg (1 lb 5.2 oz) Filed Weights   07/09/16 0448 07/10/16 0415 07/30/2016 0500  Weight: 94 kg (207 lb 3.7 oz) 99.1 kg (218 lb 7.6 oz) 99.7 kg (219 lb 12.8 oz)    Intake/Output: I/O last 3 completed shifts: In: 8238.8 [I.V.:7738.8; IV Piggyback:500] Out: 168 [Urine:168]   Intake/Output this shift:  Total I/O In: 35.9 [I.V.:35.9] Out: 0   Physical Exam: General: Critically Ill  Head: ETT  Eyes: Anicteric, PERRL  Neck: Supple, trachea midline  Lungs:  PRVC 40%  Heart: tachycardia  Abdomen:  Soft, nontender, +colostomy  Extremities: ++ peripheral edema.  Neurologic: Intubated and sedated  Skin: No lesions  Access: Left femoral temp HD catheter 6/80    Basic Metabolic Panel:  Recent Labs Lab 06/21/2016 1822 07/09/16 0430  07/10/16 0540  07/10/16 1418 07/10/16 1742 07/10/16 2116 07-30-2016 0133 07-30-2016 0547  NA 138 138  < >  --   < > 136 138 138 137 138  K 4.1 4.3  < >  --   < > 4.1 4.4 4.3 4.1 4.1  CL 114* 108  < >  --   < > 104 106 105 106 106  CO2 19* 20*  < >  --   < > 25 25 24 25 25   GLUCOSE 111* 97  < >  --   < > 89 85 81 72 88  BUN 43* 43*  < >  --   < > 22* 21* 20 17 15   CREATININE 1.53* 1.73*  < >  --   < > 0.89 0.95 0.79 0.84 0.69  CALCIUM 8.0* 7.8*  < >  --   < > 7.6* 7.9* 8.0* 7.9* 8.0*  MG 1.3* 1.7  --  1.8  --   --   --   --   --  1.7  PHOS 3.4 3.3  < >  --   < > 2.1* 2.0* 2.0* 1.7* 1.6*  < > = values in this interval not  displayed.  Liver Function Tests:  Recent Labs Lab 06/25/2016 0918 07/18/2016 1822  07/10/16 1418 07/10/16 1742 07/10/16 2116 07/30/2016 0133 07/30/16 0547  AST 76* 123*  --   --   --   --   --   --   ALT 46 77*  --   --   --   --   --   --   ALKPHOS 92 77  --   --   --   --   --   --   BILITOT 0.5 0.5  --   --   --   --   --   --   PROT 5.6* 5.1*  --   --   --   --   --   --   ALBUMIN 2.3* 2.0*  < > 1.8* 1.9* 2.0* 1.7* 1.8*  < > =  values in this interval not displayed. No results for input(s): LIPASE, AMYLASE in the last 168 hours. No results for input(s): AMMONIA in the last 168 hours.  CBC:  Recent Labs Lab 07/19/2016 0918 06/28/2016 1822 07/09/16 0430 07/10/16 0506 07/24/2016 0547  WBC 5.9 19.9* 16.1* 17.7* 18.6*  NEUTROABS 5.7 18.5*  --   --   --   HGB 9.1* 8.0* 7.9* 8.0* 8.4*  HCT 29.0* 26.6* 25.2* 25.1* 26.3*  MCV 79.3* 80.2 78.3* 76.8* 76.6*  PLT 143* 138* 104* 79* 69*    Cardiac Enzymes:  Recent Labs Lab 06/26/2016 0918  TROPONINI 0.06*    BNP: Invalid input(s): POCBNP  CBG:  Recent Labs Lab 2016-07-24 0043 07-24-16 0405 07/24/16 0408 07-24-2016 0437 07/24/16 0721  GLUCAP 75 41* 19* 113* 42    Microbiology: Results for orders placed or performed during the hospital encounter of 07/02/2016  Blood Culture (routine x 2)     Status: Abnormal (Preliminary result)   Collection Time: 06/27/2016  9:18 AM  Result Value Ref Range Status   Specimen Description BLOOD LEFT HAND  Final   Special Requests   Final    BOTTLES DRAWN AEROBIC AND ANAEROBIC  AER [place] ANA 5CC   Culture  Setup Time   Final    GRAM NEGATIVE RODS AEROBIC BOTTLE ONLY CRITICAL RESULT CALLED TO, READ BACK BY AND VERIFIED WITH: JASON ROBBINS [place] AT [place] 07/09/16 MSS. GRAM POSITIVE COCCI ANAEROBIC BOTTLE ONLY CRITICAL RESULT CALLED TO, READ BACK BY AND VERIFIED WITH: JASON ROBBINS AT 9937 ON 07/09/16.[place] Kitchen[place] CONFIRMED BY [facility]    Culture (A)  Final    ESCHERICHIA COLI VIRIDANS STREPTOCOCCUS THE  SIGNIFICANCE OF ISOLATING THIS ORGANISM FROM A SINGLE SET OF BLOOD CULTURES WHEN MULTIPLE SETS ARE DRAWN IS UNCERTAIN. PLEASE NOTIFY THE MICROBIOLOGY DEPARTMENT WITHIN ONE WEEK IF SPECIATION AND SENSITIVITIES ARE REQUIRED. GRAM POSITIVE COCCI    Report Status PENDING  Incomplete  Blood Culture (routine x 2)     Status: None (Preliminary result)   Collection Time: 07/10/2016  9:18 AM  Result Value Ref Range Status   Specimen Description BLOOD RIGHT HAND  Final   Special Requests BOTTLES DRAWN AEROBIC AND ANAEROBIC  5CC  Final   Culture  Setup Time   Final    GRAM NEGATIVE RODS AEROBIC BOTTLE ONLY CRITICAL VALUE NOTED.  VALUE IS CONSISTENT WITH PREVIOUSLY REPORTED AND CALLED VALUE. JASON ROBBINS PH AT 0025 07/09/16 MSS. CONFIRMED BY [facility]    Culture GRAM NEGATIVE RODS  Final   Report Status PENDING  Incomplete  Urine culture     Status: Abnormal   Collection Time: 06/20/2016  9:18 AM  Result Value Ref Range Status   Specimen Description URINE, RANDOM  Final   Special Requests NONE  Final   Culture (A)  Final    >=100,000 COLONIES/mL ESCHERICHIA COLI Confirmed Extended Spectrum Beta-Lactamase Producer (ESBL) Performed at [facility]    Report Status 07/10/2016 FINAL  Final   Organism ID, Bacteria ESCHERICHIA COLI (A)  Final      Susceptibility   Escherichia coli - MIC*    AMPICILLIN >=32 RESISTANT Resistant     CEFAZOLIN >=64 RESISTANT Resistant     CEFTRIAXONE >=64 RESISTANT Resistant     CIPROFLOXACIN >=4 RESISTANT Resistant     GENTAMICIN <=1 SENSITIVE Sensitive     IMIPENEM <=0.25 SENSITIVE Sensitive     NITROFURANTOIN <=16 SENSITIVE Sensitive     TRIMETH/SULFA >=320 RESISTANT Resistant     AMPICILLIN/SULBACTAM 4 SENSITIVE Sensitive  PIP/TAZO <=4 SENSITIVE Sensitive     Extended ESBL POSITIVE Resistant     * >=100,000 COLONIES/mL ESCHERICHIA COLI  Blood Culture ID Panel (Reflexed)     Status: Abnormal   Collection Time: 06/23/2016  9:18 AM  Result Value Ref Range  Status   Enterococcus species NOT DETECTED NOT DETECTED Final   Vancomycin resistance NOT DETECTED NOT DETECTED Final   Listeria monocytogenes NOT DETECTED NOT DETECTED Final   Staphylococcus species NOT DETECTED NOT DETECTED Final   Staphylococcus aureus NOT DETECTED NOT DETECTED Final   Methicillin resistance NOT DETECTED NOT DETECTED Final   Streptococcus species NOT DETECTED NOT DETECTED Final   Streptococcus agalactiae NOT DETECTED NOT DETECTED Final   Streptococcus pneumoniae NOT DETECTED NOT DETECTED Final   Streptococcus pyogenes NOT DETECTED NOT DETECTED Final   Acinetobacter baumannii NOT DETECTED NOT DETECTED Final   Enterobacteriaceae species NOT DETECTED NOT DETECTED Final   Enterobacter cloacae complex NOT DETECTED NOT DETECTED Final   Escherichia coli DETECTED (A) NOT DETECTED Final    Comment: CRITICAL RESULT CALLED TO, READ BACK BY AND VERIFIED WITH: JASON ROBBINS [place] AT 0025 07/09/16 MSS.    Klebsiella oxytoca NOT DETECTED NOT DETECTED Final   Klebsiella pneumoniae NOT DETECTED NOT DETECTED Final   Proteus species NOT DETECTED NOT DETECTED Final   Serratia marcescens NOT DETECTED NOT DETECTED Final   Carbapenem resistance NOT DETECTED NOT DETECTED Final   Haemophilus influenzae NOT DETECTED NOT DETECTED Final   Neisseria meningitidis NOT DETECTED NOT DETECTED Final   Pseudomonas aeruginosa NOT DETECTED NOT DETECTED Final   Candida albicans NOT DETECTED NOT DETECTED Final   Candida glabrata NOT DETECTED NOT DETECTED Final   Candida krusei NOT DETECTED NOT DETECTED Final   Candida parapsilosis NOT DETECTED NOT DETECTED Final   Candida tropicalis NOT DETECTED NOT DETECTED Final  Blood Culture ID Panel (Reflexed)     Status: Abnormal   Collection Time: 06/29/2016  9:18 AM  Result Value Ref Range Status   Enterococcus species NOT DETECTED NOT DETECTED Final   Vancomycin resistance NOT DETECTED NOT DETECTED Final   Listeria monocytogenes NOT DETECTED NOT DETECTED Final    Staphylococcus species DETECTED (A) NOT DETECTED Final    Comment: CRITICAL RESULT CALLED TO, READ BACK BY AND VERIFIED WITH: JASON ROBBINS AT 5681 ON 07/09/16.[place] Kitchen[place]land Kitchen[place]    Staphylococcus aureus NOT DETECTED NOT DETECTED Final   Methicillin resistance NOT DETECTED NOT DETECTED Final   Streptococcus species DETECTED (A) NOT DETECTED Final    Comment: CRITICAL RESULT CALLED TO, READ BACK BY AND VERIFIED WITH: JASON ROBBINS AT 2751 ON 07/09/16.[place]land KitchenMarland KitchenBar[place]    Streptococcus agalactiae NOT DETECTED NOT DETECTED Final   Streptococcus pneumoniae NOT DETECTED NOT DETECTED Final   Streptococcus pyogenes NOT DETECTED NOT DETECTED Final   Acinetobacter baumannii NOT DETECTED NOT DETECTED Final   Enterobacteriaceae species NOT DETECTED NOT DETECTED Final   Enterobacter cloacae complex NOT DETECTED NOT DETECTED Final   Escherichia coli NOT DETECTED NOT DETECTED Final   Klebsiella oxytoca NOT DETECTED NOT DETECTED Final   Klebsiella pneumoniae NOT DETECTED NOT DETECTED Final   Proteus species NOT DETECTED NOT DETECTED Final   Serratia marcescens NOT DETECTED NOT DETECTED Final   Carbapenem resistance NOT DETECTED NOT DETECTED Final   Haemophilus influenzae NOT DETECTED NOT DETECTED Final   Neisseria meningitidis NOT DETECTED NOT DETECTED Final   Pseudomonas aeruginosa NOT DETECTED NOT DETECTED Final   Candida albicans NOT DETECTED NOT DETECTED Final   Candida glabrata NOT DETECTED NOT DETECTED Final  Candida krusei NOT DETECTED NOT DETECTED Final   Candida parapsilosis NOT DETECTED NOT DETECTED Final   Candida tropicalis NOT DETECTED NOT DETECTED Final  MRSA PCR Screening     Status: None   Collection Time: 07/05/2016  1:40 PM  Result Value Ref Range Status   MRSA by PCR NEGATIVE NEGATIVE Final    Comment:        The GeneXpert MRSA Assay (FDA approved for NASAL specimens only), is one component of a comprehensive MRSA colonization surveillance program. It is not intended to diagnose MRSA infection nor  to guide or monitor treatment for MRSA infections.   Urine culture     Status: Abnormal (Preliminary result)   Collection Time: 06/28/2016  3:18 PM  Result Value Ref Range Status   Specimen Description URINE, RANDOM LEFT RENAL URINE CULTURE  Final   Special Requests NONE  Final   Culture (A)  Final    >=100,000 COLONIES/mL ESCHERICHIA COLI SUSCEPTIBILITIES TO FOLLOW Performed at [facility]    Report Status PENDING  Incomplete    Coagulation Studies: No results for input(s): LABPROT, INR in the last 72 hours.  Urinalysis: No results for input(s): COLORURINE, LABSPEC, PHURINE, GLUCOSEU, HGBUR, BILIRUBINUR, KETONESUR, PROTEINUR, UROBILINOGEN, NITRITE, LEUKOCYTESUR in the last 72 hours.  Invalid input(s): APPERANCEUR    Imaging: Dg Chest 1 View  Result Date: 07-17-16 CLINICAL DATA:  Shortness of breath. EXAM: CHEST 1 VIEW COMPARISON:  07/10/2016. FINDINGS: Endotracheal tube, NG tube, right IJ line stable position. Prior CABG. Cardiomegaly pulmonary vascular prominence and bilateral interstitial prominence bilateral effusions. Findings consistent with congestive heart failure. Low lung volumes with basilar atelectasis. No pneumothorax IMPRESSION: 1. Lines and tubes in stable position. 2. Prior CABG. Persistent cardiomegaly with bilateral from interstitial prominence and pleural effusions consistent persistent congestive heart failure. No interim change prior exam. Electronically Signed   By: Marcello Moores  Register   On: 07-17-16 07:32   Dg Chest Port 1 View  Result Date: 07/10/2016 CLINICAL DATA:  Hypoxia EXAM: PORTABLE CHEST 1 VIEW COMPARISON:  July 09, 2016 FINDINGS: Endotracheal tube tip is 3.5 cm above the carina. Nasogastric tube tip and side port below the diaphragm. Central catheter tip is in the superior vena cava. No pneumothorax. There are bilateral pleural effusions with bibasilar atelectatic change. Heart is enlarged with mild pulmonary venous hypertension. No  adenopathy evident. Patient is status post coronary artery bypass grafting. There is atherosclerotic calcification in the aorta. IMPRESSION: Tube and catheter positions as described without pneumothorax. Evidence of congestive heart failure. No airspace consolidation appreciable. There is aortic atherosclerosis. Electronically Signed   By: Lowella Grip III M.D.   On: 07/10/2016 08:07     Medications:   . fentaNYL infusion INTRAVENOUS 275 mcg/hr (2016/07/17 0230)  . norepinephrine (LEVOPHED) Adult infusion 9 mcg/min (07/17/16 0800)  . pureflow 3 each (17-Jul-2016 2426)  . vasopressin (PITRESSIN) infusion - *FOR SHOCK* 0.02 Units/min (07/09/16 2015)   . antiseptic oral rinse  7 mL Mouth Rinse Q2H  . chlorhexidine gluconate (SAGE KIT)  15 mL Mouth Rinse BID  . famotidine (PEPCID) IV  20 mg Intravenous Q24H  . heparin subcutaneous  5,000 Units Subcutaneous Q8H  . insulin aspart  2-6 Units Subcutaneous Q4H  . meropenem (MERREM) IV  1 g Intravenous Q8H  . predniSONE  40 mg Per Tube Daily   Followed by  . [START ON 07/12/2016] predniSONE  30 mg Per Tube Daily   Followed by  . [START ON 07/13/2016] predniSONE  20 mg Per  Tube Daily   Followed by  . [START ON 07/14/2016] predniSONE  10 mg Per Tube Daily   Followed by  . [START ON 07/15/2016] predniSONE  5 mg Per Tube Daily  . sodium phosphate  Dextrose 5% IVPB  30 mmol Intravenous Once   sodium chloride, acetaminophen, acetaminophen, fentaNYL (SUBLIMAZE) injection, fentaNYL (SUBLIMAZE) injection, heparin, ipratropium-albuterol, midazolam, ondansetron (ZOFRAN) IV  Assessment/ Plan:  Ms. Suzanne Ewing is a 70 y.o. white female with coronary artery disease, diabetes mellitus type 2, GERD, hypertension, history of necrotizing fasciitis, colostomy, nephrolithiasis with recent lithotripsy who was admitted to [facility] on 07/07/2016   1.  Acute renal failure with metabolic acidosis due to ATN from septic shock N17.0:  Anuric.Baseline creatinine 0.7. Placed  on emergent renal replacement therapy.  Prognosis poor.  -  Replace phosphorus IV  2. Septic shock with Escherichia coli urinary tract infection. Leukocytosis. ESBL - merepenem    3. Acute respiratory failure. Continue ventilatory support. FiO2 currently 40%.   LOS: 3 Suzanne Ewing 2017-09-189:34 AM

## 2016-07-21 NOTE — Progress Notes (Signed)
Advanced Care Planning update: Pt's condition discussed in presence of patient with daughter, son, husband, sister, critical care RN. Husband notes that patient never wanted to be put on life support, she had chronic illness at her nursing home and requires assistance with almost all daily activities. Discussed with family that patient is now critically ill and is on life support.  Patient's condition continues to be severely critical, she continues to be maintained on CRRT, ventilator, and pressor support. The patient has remained unresponsive with weaning of sedation. Discussed with the family that even if patient were to improve, it is unlikely that she would be able to have a meaningful recovery and stay out of the hospital for any length of time. In light of this, the family has decided that The patient would not want to be maintained in this condition , we have, therefore, changed her status to comfort measures only with terminal extubation. 30 min spent in ACP activities.   Marda Stalker, M.D. 2016/08/03

## 2016-07-21 NOTE — Progress Notes (Signed)
RN spoke with Dr. Ashby Dawes and made MD aware that patient's rhythm continues to go in and out of SVT and Sinus tach rate as high as 180.  MD gave order for amioderone bolus and infusion.

## 2016-07-21 NOTE — Progress Notes (Signed)
Patient had approximately 2 minute run of SVT rate as high as 172.  RN made Dr. Ashby Dawes aware and no new orders given.  Patient back in NSR-Stach.

## 2016-07-21 NOTE — Care Management (Addendum)
Met with patient's niece and patient's daughter that had many questions regarding decision to withdraw care. Daughter made it clear that she or her father  "do not want patient to go to hospice home". She states that if patient can survive withdrawal of care then they want her to go back to [facility] (private room) under hospice care through [facility]. They understand that patient may not survive being taken off dialysis and ventilation. Daughter hopes that "her mother will pass peacefully". She agrees also to comfort care at this hospital if she cannot survive transfer back to [facility]. She is waiting on her brother's arrival to this hospital and will call RN when everyone is present to talk with Dr. Loann Quill with [facility] notified of this potential need.

## 2016-07-21 NOTE — Progress Notes (Signed)
Nutrition Brief Note  Chart reviewed. Pt now transitioning to comfort care. NPO, terminal extubation No further nutrition interventions warranted at this time.  Please re-consult as needed.   Kerman Passey [place], [place], LDN [phone] Pager  [phone] Weekend/On-Call Pager

## 2016-07-21 NOTE — Progress Notes (Signed)
[facility] Progess Note    ASSESSMENT/PLAN   DISCUSSION: 70 yo F with recent left-sided lithotripsy, history of recurrent UTI, MRSA, gas gangrene, diabetes, CAD, hyperlipidemia, now s/p lithotripsy>>septic shock-severe metabolic\lactic acidosis, E.coli bacteremia.   ASSESSMENT / PLAN:  PULMONARY Acute hypoxemic respiratory failure secondary to septic shock.  Severe metabolic acidosis Right-sided loculated pleural effusion-chronic; last thoracentesis 11/30/2015, no growth.  H/O COPD VDRF, PRVC/30/400/5/30%;  ABG--06/19/49/78-- consistent with metabolic/respiratory acidosis.  P: Continue vent support, decrease rate.  Maintain O2 sat>88% No weaning at this time due to continued decreased mental status and septic shock.  PRN Duonebs Will consider repeat thoracentesis if effusions worsens/sepsis persists or unable to wean from vent.    Cardiovascular A: Septic shock-due to UTI Hx of Hypertension Afib-ECHO 11/2015>EF 40-45%, LVH, sys CHF, mild MR,  mild LA dilation History of coronary artery disease Peripheral vascular disease P: Continue to titrate levophed to keep MAP 65 and above Wean pressors as tolerated Hold beta blockers for now given hypotension Hold lasix, diltiazem and all home BP meds in light of shock Wean down steroids   RENAL Acute Left hydronephrosis - seen intraoperatively (See urology note) Hx of left Renal calculi status post left-sided lithotripsy with ureteral stent placement Oliguria s/p left ureteral stent placement with worsening creatinine-creatinine improved with CRRT Recurrent UTI History of chronic indwelling catheter Acute renal failure Metabolic Acidosis/Lactic Acidosis-Lactic acidosis resolved P: Monitor BMP, and Cr Avoid nephrotoxic drugs Irrigate foley prn Nephrology following, CRRT per nephrology    GI A: H/O GERD S/p L colostomy Bag placement P: Cont with GI prophylaxis Colostomy  care  INFECTIOUS DISEASE Septic shock-urinary source Recurrent UTI Bacteremia-GNR P: F/U cultures Follow-up pro-calcitonin Abx as stated above  ENDOCRINE A: Type 2 DM P: ICU hypo/hyperglycemia protocol SSI  NEURO AMS Acute metabolic Encephalopathy related to underlying infection A: Avoid sedative drugs if possible Cont to monitor neuro status.  Continue current treatment for acidosis and sepsis    Best Practices  DVT Prophylaxis: Heparin sQ.  GI Prophylaxis: Famotidine.   SIGNIFICANT EVENTS: 8/8>left ureteral calculi, bladder calculi, cystoscopy with uroscopy, lithotripsy and indwelling stent inserted on the left, removal of bladder stone, less than 2.5 cm, lithotripsy including insertion of indwelling urethral stent, crush calculus and removal of fragments. 8/15>urology follow-up, complaining of voiding issues, UA with 3+ nitrites 8/17>family states patient incompletely able to void, urology ordered catheterized urine culture 8/19>septic shock, urgent L ureteral stent, 20cc of cloud fluid removed from L ureter by urology, worsening shock, ETT from OR, bicarb gtt 8/20>GNR bacteremia, septic shock, remains ETT,  ---------------------------------------  Advanced Care Planning Note from 8/21: Pt's condition discussed in presence of patient with daughter, son, husband, sister. Husband notes that patient never wanted to be put on life support, she had chronic illness at her nursing home and requires assistance with almost all daily activities. Discussed with family that patient is now critically ill and is on life support.  Her condition is critical and appears to have stabilized, thought explained that her condition could worsen at any time, and can do so quickly.  They appear uncertain about making decision about EOL, and do not appear to have considered this. Therefore at this time, we will continue current measures, I discussed with them about changing her code  status, they will discuss this amongst themselves, and then let us know of their decision.  30 min spent in ACP activities.  ----------------------------------------   Name: Suzanne Ewing MRN: [id number] DOB: 1946-10-03  ADMISSION DATE:  06/30/2016    SUBJECTIVE:   Pt currently on the ventilator, can not provide history or review of systems.   Review of Systems:  --   VITAL SIGNS: Temp:  [96.8 F (36 C)-97.5 F (36.4 C)] 97.5 F (36.4 C) (08/22 0708) Pulse Rate:  [37-110] 105 (08/22 0800) Resp:  [0-30] 19 (08/22 0800) BP: (77-149)/(41-87) 149/78 (08/22 0800) SpO2:  [83 %-100 %] 100 % (08/22 0800) FiO2 (%):  [30 %-40 %] 30 % (08/22 0543) Weight:  [219 lb 12.8 oz (99.7 kg)] 219 lb 12.8 oz (99.7 kg) (08/22 0500) HEMODYNAMICS: CVP:  [4 mmHg-22 mmHg] 15 mmHg VENTILATOR SETTINGS: Vent Mode: PRVC FiO2 (%):  [30 %-40 %] 30 % Set Rate:  [30 bmp] 30 bmp Vt Set:  [400 mL] 400 mL PEEP:  [5 cmH20] 5 cmH20 INTAKE / OUTPUT:  Intake/Output Summary (Last 24 hours) at 2016/07/21 0806 Last data filed at July 21, 2016 0800  Gross per 24 hour  Intake          1337.07 ml  Output               92 ml  Net          1245.07 ml    PHYSICAL EXAMINATION: Physical Examination:   VS: BP (!) 149/78   Pulse (!) 105   Temp 97.5 F (36.4 C) (Core (Comment))   Resp 19   Ht 5\' 6"  (1.676 m)   Wt 219 lb 12.8 oz (99.7 kg)   SpO2 100%   BMI 35.48 kg/m   General Appearance: Unresponsive on the vent.  Neuro:without focal findings, mental status normal. HEENT: PERRLA, EOM intact. Pulmonary: normal breath sounds   CardiovascularNormal S1,S2.  No m/r/g.   Abdomen: Benign, Soft, non-tender. Renal:  No costovertebral tenderness  GU:  Not performed at this time. Endocrine: No evident thyromegaly. Skin:   warm, no rashes, no ecchymosis  Extremities: normal, no cyanosis, clubbing.   LABS:   LABORATORY PANEL:   CBC  Recent Labs Lab 07-21-2016 0547  WBC 18.6*  HGB 8.4*  HCT 26.3*  PLT 69*     Chemistries   Recent Labs Lab 07/10/2016 1822  21-Jul-2016 0547  NA 138  < > 138  K 4.1  < > 4.1  CL 114*  < > 106  CO2 19*  < > 25  GLUCOSE 111*  < > 88  BUN 43*  < > 15  CREATININE 1.53*  < > 0.69  CALCIUM 8.0*  < > 8.0*  MG 1.3*  < > 1.7  PHOS 3.4  < > 1.6*  AST 123*  --   --   ALT 77*  --   --   ALKPHOS 77  --   --   BILITOT 0.5  --   --   < > = values in this interval not displayed.   Recent Labs Lab July 21, 2016 0000 21-Jul-2016 0043 July 21, 2016 0405 Jul 21, 2016 0408 21-Jul-2016 0437 07-21-16 0721  GLUCAP 67 75 41* 55* 113* 80    Recent Labs Lab 07/09/16 0745 07/10/16 0608 21-Jul-2016 0500  PHART 7.24* 7.32* 7.31*  PCO2ART 47 54* 50*  PO2ART 70* 73* 78*    Recent Labs Lab 07/16/2016 0918 06/23/2016 1822  07/10/16 2116 07-21-16 0133 2016-07-21 0547  AST 76* 123*  --   --   --   --   ALT 46 77*  --   --   --   --   ALKPHOS 92 77  --   --   --   --  BILITOT 0.5 0.5  --   --   --   --   ALBUMIN 2.3* 2.0*  < > 2.0* 1.7* 1.8*  < > = values in this interval not displayed.  Cardiac Enzymes  Recent Labs Lab 07/04/2016 0918  TROPONINI 0.06*    RADIOLOGY:  Dg Chest 1 View  Result Date: 07/22/16 CLINICAL DATA:  Shortness of breath. EXAM: CHEST 1 VIEW COMPARISON:  07/10/2016. FINDINGS: Endotracheal tube, NG tube, right IJ line stable position. Prior CABG. Cardiomegaly pulmonary vascular prominence and bilateral interstitial prominence bilateral effusions. Findings consistent with congestive heart failure. Low lung volumes with basilar atelectasis. No pneumothorax IMPRESSION: 1. Lines and tubes in stable position. 2. Prior CABG. Persistent cardiomegaly with bilateral from interstitial prominence and pleural effusions consistent persistent congestive heart failure. No interim change prior exam. Electronically Signed   By: Marcello Moores  Register   On: 07-22-2016 07:32   Dg Chest Port 1 View  Result Date: 07/10/2016 CLINICAL DATA:  Hypoxia EXAM: PORTABLE CHEST 1 VIEW COMPARISON:   July 09, 2016 FINDINGS: Endotracheal tube tip is 3.5 cm above the carina. Nasogastric tube tip and side port below the diaphragm. Central catheter tip is in the superior vena cava. No pneumothorax. There are bilateral pleural effusions with bibasilar atelectatic change. Heart is enlarged with mild pulmonary venous hypertension. No adenopathy evident. Patient is status post coronary artery bypass grafting. There is atherosclerotic calcification in the aorta. IMPRESSION: Tube and catheter positions as described without pneumothorax. Evidence of congestive heart failure. No airspace consolidation appreciable. There is aortic atherosclerosis. Electronically Signed   By: Lowella Grip III M.D.   On: 07/10/2016 08:07       --Marda Stalker, MD.  ICU Pager: [phone] [facility] Office Number: [phone]  Patricia Pesa, M.D.  Vilinda Boehringer, M.D.  Merton Border, M.D  07/22/2016   Critical Care Attestation.  I have personally obtained a history, examined the patient, evaluated laboratory and imaging results, formulated the assessment and plan and placed orders. The Patient requires high complexity decision making for assessment and support, frequent evaluation and titration of therapies, application of advanced monitoring technologies and extensive interpretation of multiple databases. The patient has critical illness that could lead imminently to failure of 1 or more organ systems and requires the highest level of physician preparedness to intervene.  Critical Care Time devoted to patient care services described in this note is 45 minutes and is exclusive of time spent in procedures.

## 2016-07-21 NOTE — Progress Notes (Signed)
RT called to room for terminal extubation of patient.  Patient suctioned prior to extubation, extubated and placed on 2LPM [place] without complication.  RN Brittney in room during extubation.  Family brought in room immediately following extubation.

## 2016-07-21 NOTE — Progress Notes (Signed)
Dr. Juleen China present and gave order to change ultrafiltration to 50.

## 2016-07-21 DEATH — deceased

## 2016-10-17 IMAGING — DX DG CHEST 1V PORT
1 series · 1 of 1 positions shown · non-contrast
Comparison: Chest radiograph performed 07/08/2016

CLINICAL DATA: Postoperative radiograph, status post left ureteral
stent placement. Initial encounter.

EXAM:
PORTABLE CHEST 1 VIEW

[chest ap]
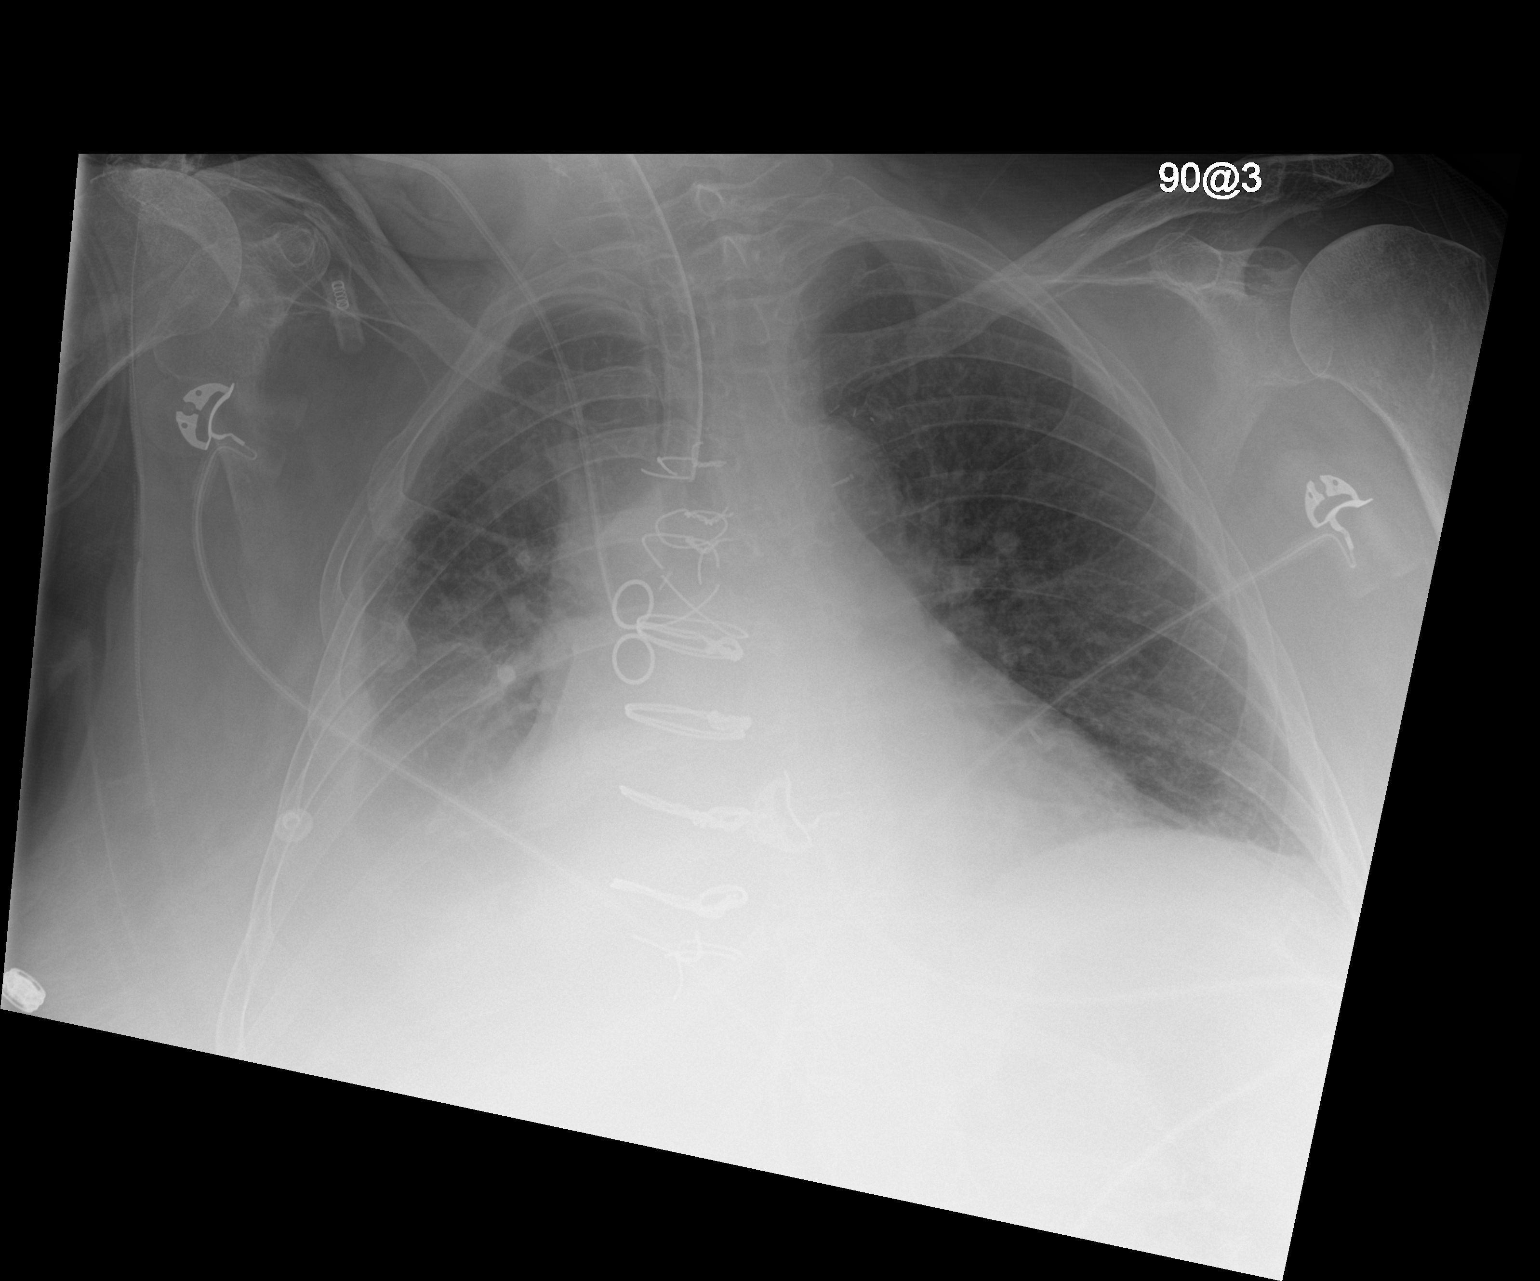

[1 of 1 positions shown; findings below may reference images not displayed]

FINDINGS: The patient's endotracheal tube is seen ending 2-3 cm above the
carina. A right IJ line is noted ending about the mid SVC.

The lungs are hypoexpanded. A small right pleural effusion is noted,
less prominent than on the prior study. Vascular crowding and
vascular congestion are seen. Increased interstitial markings raise
question for mild interstitial edema.

The cardiomediastinal silhouette is borderline normal in size. The
patient is status post median sternotomy, with evidence of prior
CABG. There is chronic fracture of multiple sternal wires. No acute
osseous abnormalities are seen.
IMPRESSION: 1. Endotracheal tube seen ending 2-3 cm above the carina.
2. Lungs hypoexpanded. Small right pleural effusion is less
prominent than on the prior study. Vascular congestion noted.
Increased interstitial markings raise question for mild interstitial
edema.

## 2016-10-17 IMAGING — DX DG ABDOMEN 1V
2 series · 3 of 3 positions shown · non-contrast
Comparison: CT of the abdomen and pelvis performed 07/08/2016

CLINICAL DATA: Left ureteral stent placement.  Initial encounter.

EXAM:
ABDOMEN - 1 VIEW

[abdomen kub (1 of 2)]
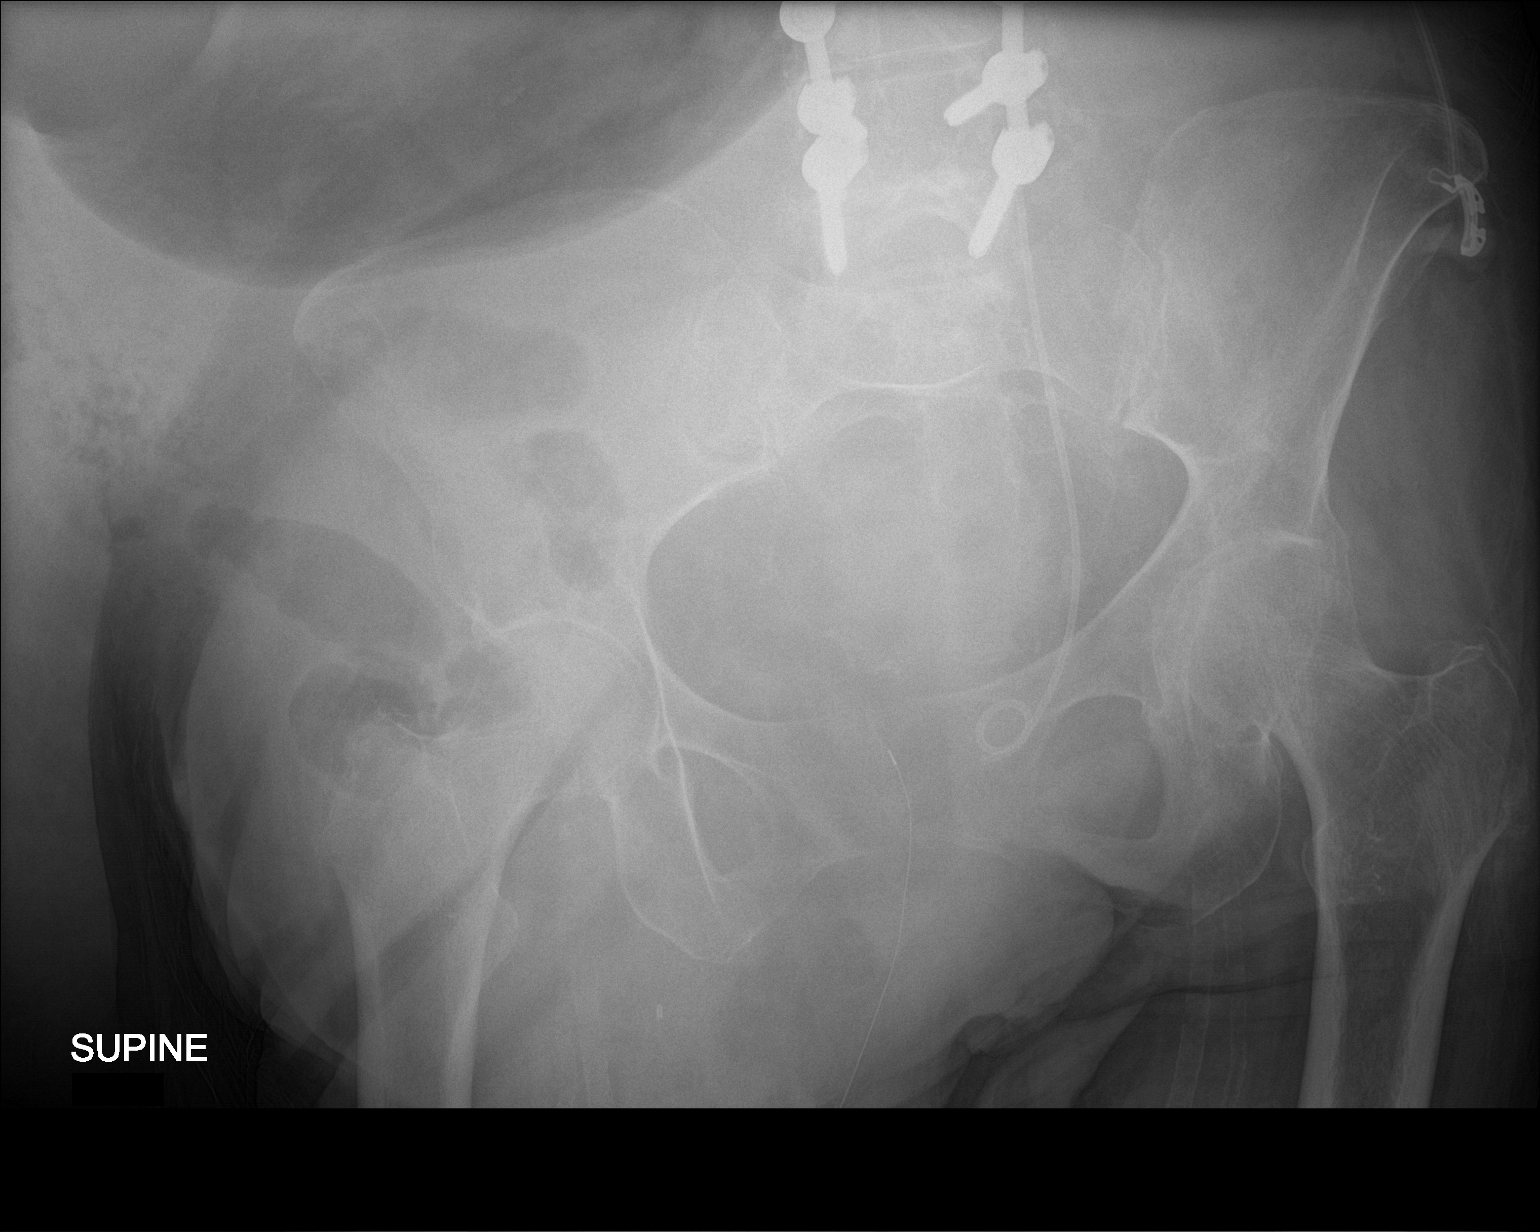

[Series 2: abdomen kub · 0.14mm/px · 2 of 2 slices shown (2 of 2)]
[im 1/2]
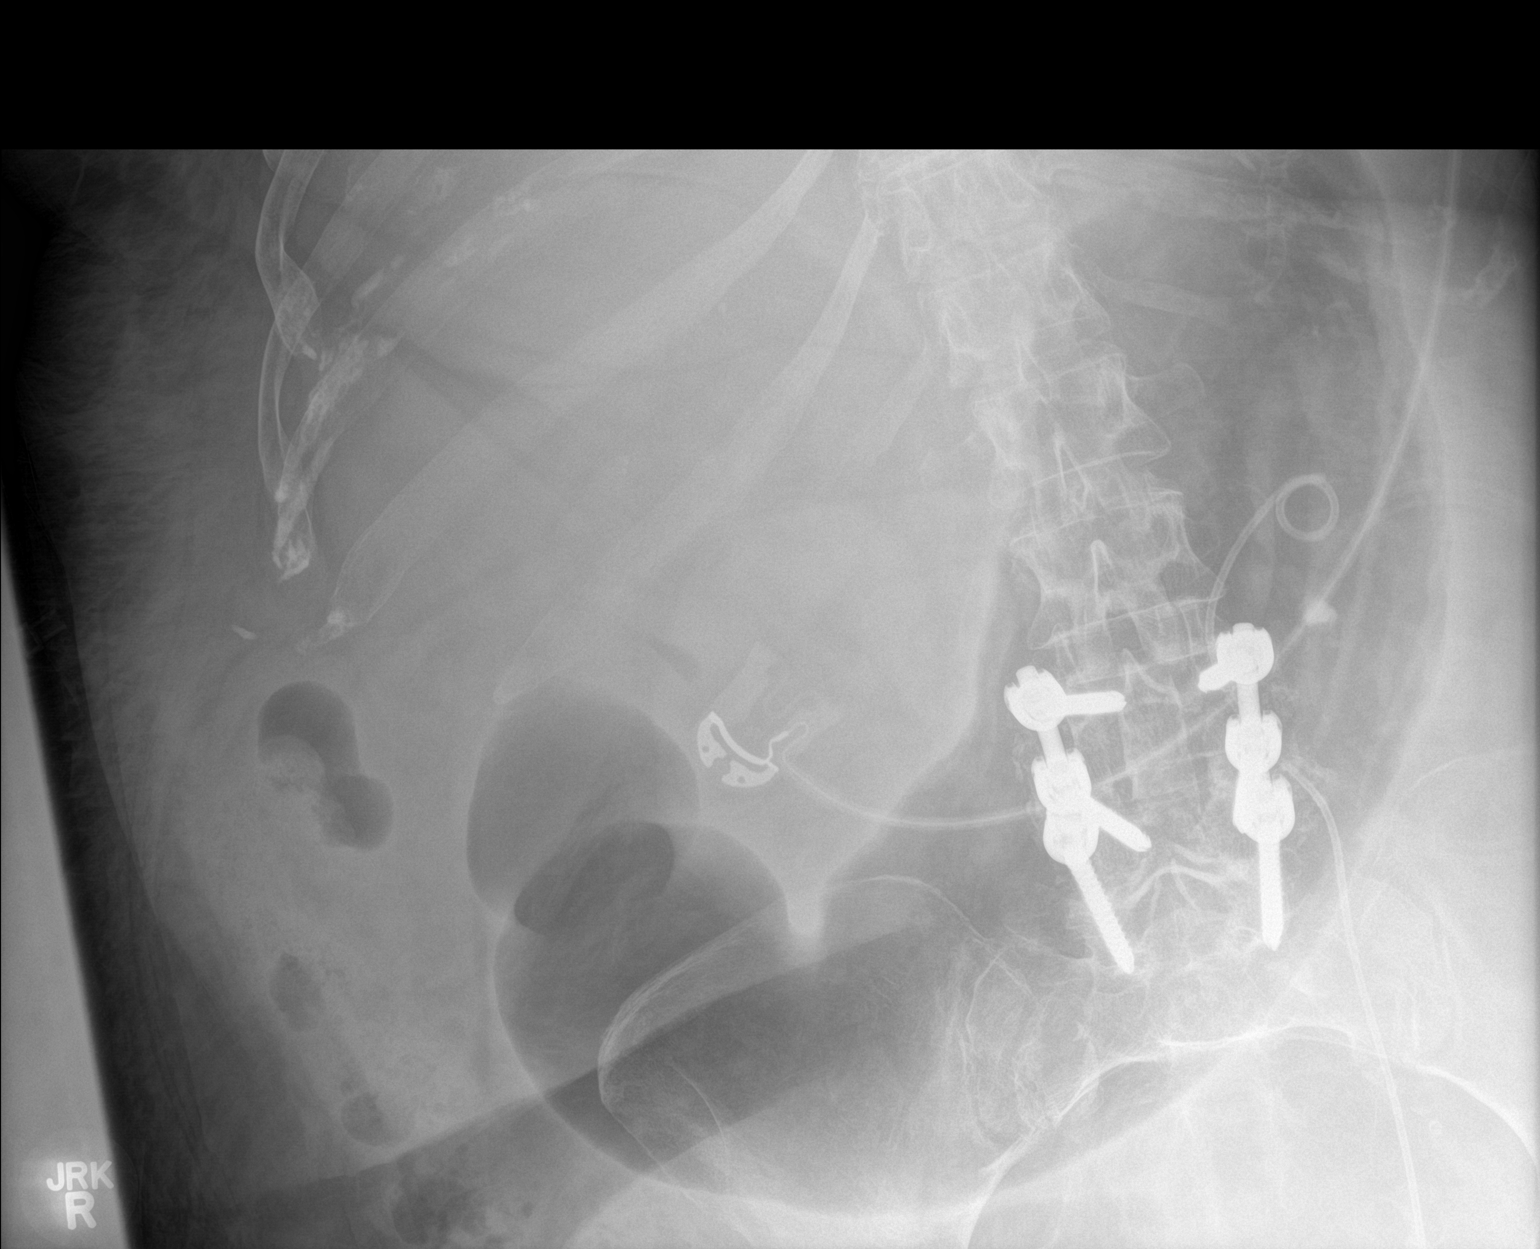
[im 2/2]
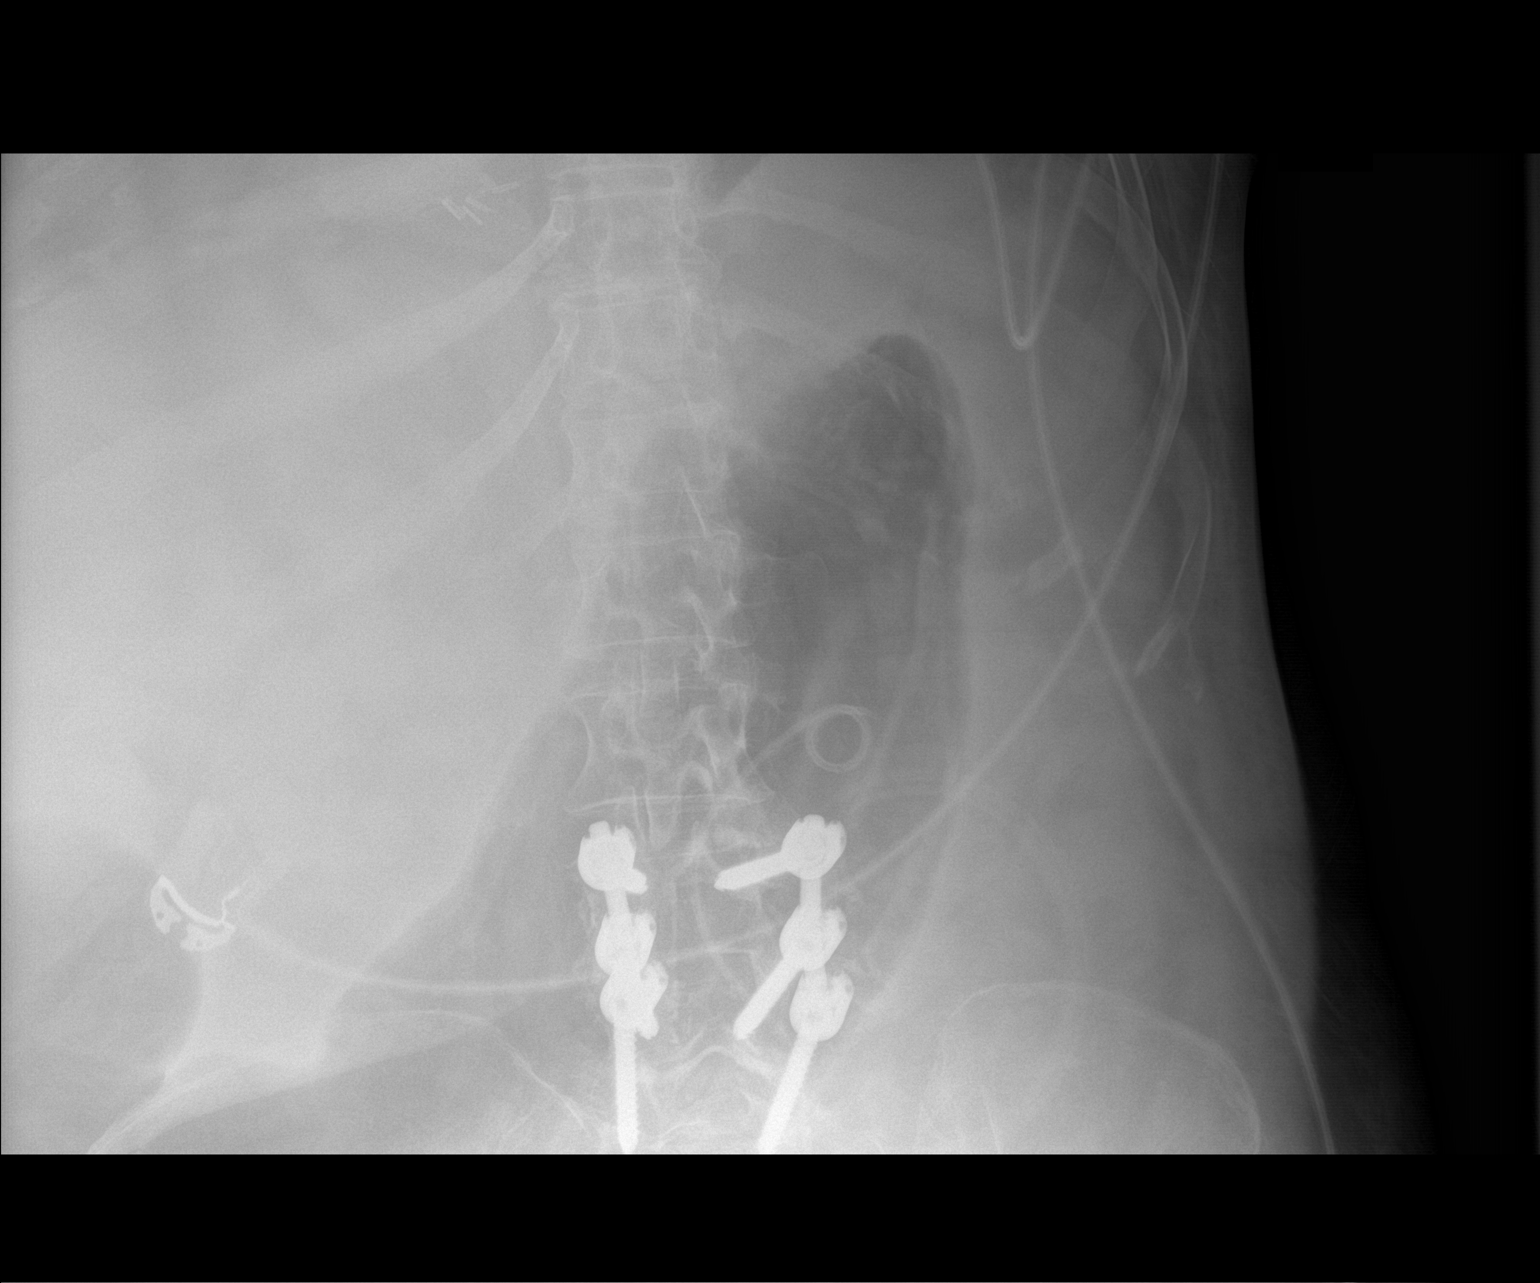

[3 of 3 positions shown; findings below may reference images not displayed]

FINDINGS: The visualized bowel gas pattern is unremarkable. Scattered air and
stool filled loops of colon are seen; no abnormal dilatation of
small bowel loops is seen to suggest small bowel obstruction. No
free intra-abdominal air is identified, though evaluation for free
air is limited on supine views. The stomach is diffusely distended
with air, raising question for mild gastroparesis.

A left ureteral stent is noted overlying expected position.

The visualized osseous structures are within normal limits; the
sacroiliac joints are unremarkable in appearance. Lumbar spinal
fusion hardware is noted at L3-L5. Scattered vascular calcifications
are seen.
IMPRESSION: 1. Left ureteral stent noted overlying expected position.
2. Stomach diffusely distended with air. Would correlate clinically
to exclude mild gastroparesis.
3. Otherwise unremarkable bowel gas pattern; no free intra-abdominal
air seen.
4. Scattered vascular calcifications seen.

## 2016-10-18 IMAGING — DX DG CHEST 1V PORT
1 series · 1 of 1 positions shown · non-contrast
Comparison: July 09, 2016

CLINICAL DATA: Hypoxia

EXAM:
PORTABLE CHEST 1 VIEW

[chest ap]
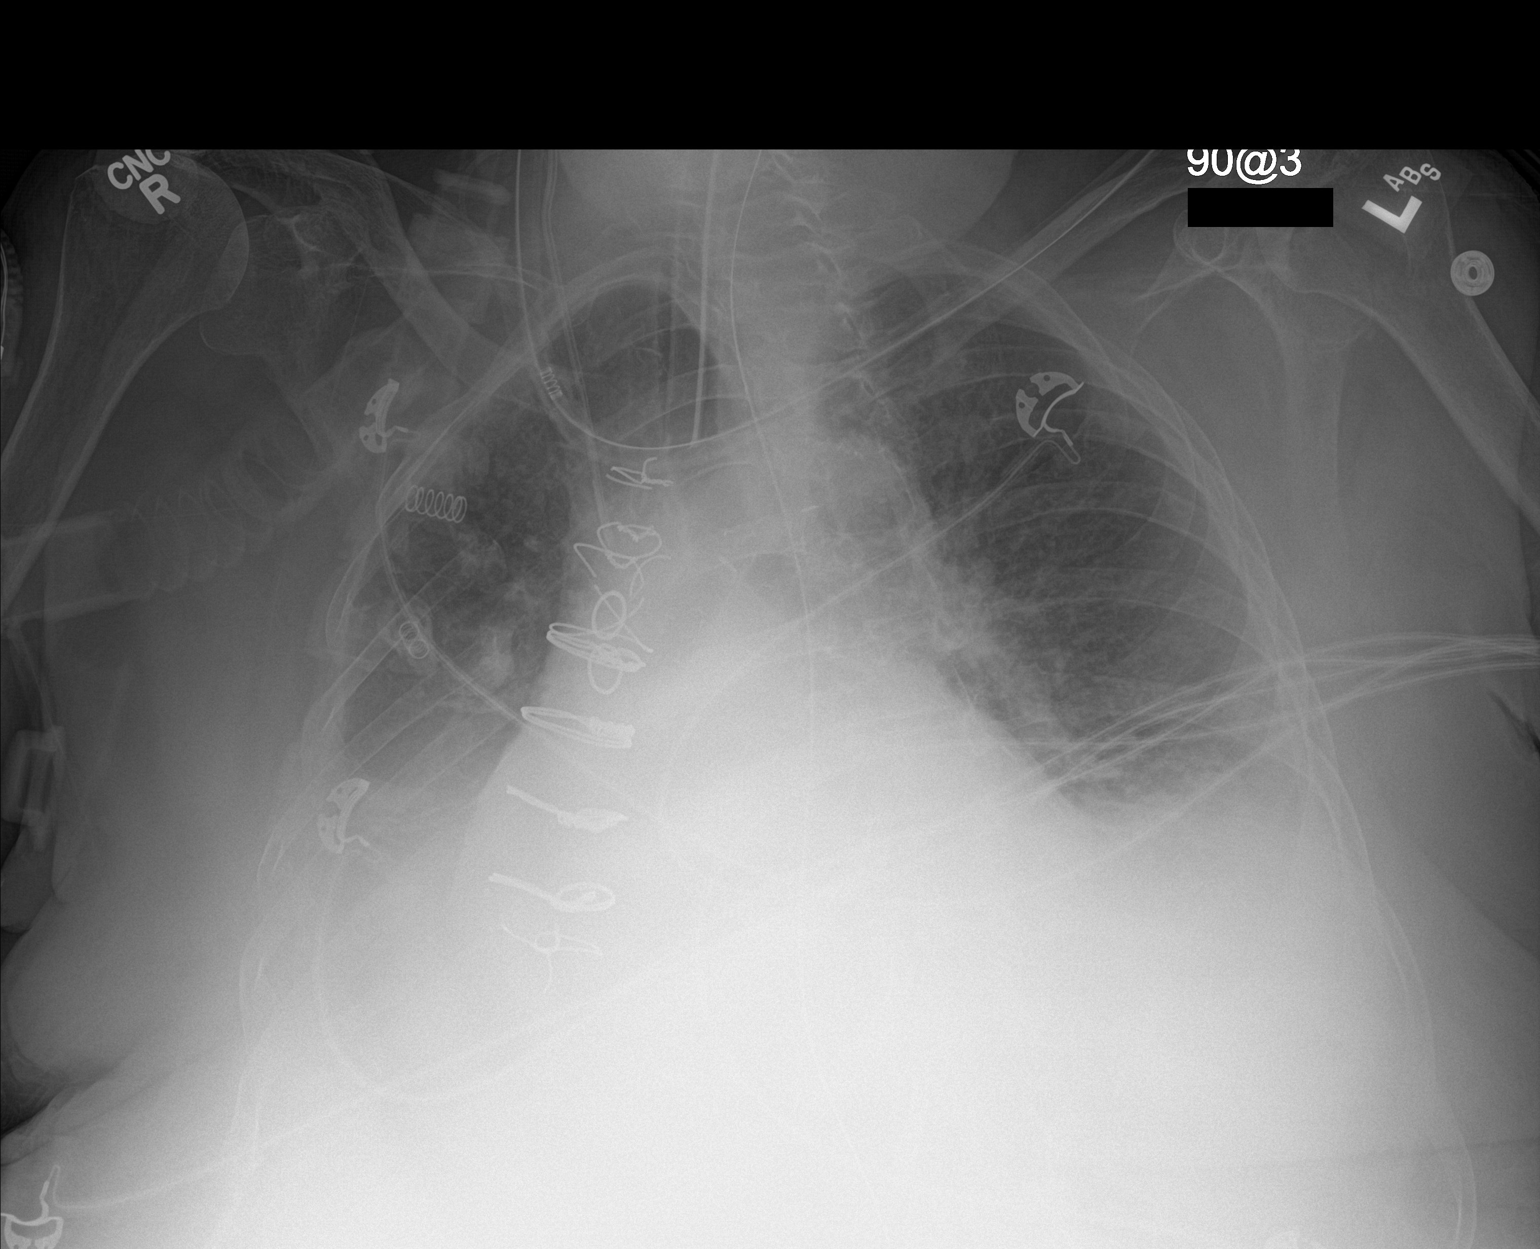

[1 of 1 positions shown; findings below may reference images not displayed]

FINDINGS: Endotracheal tube tip is 3.5 cm above the carina. Nasogastric tube
tip and side port below the diaphragm. Central catheter tip is in
the superior vena cava. No pneumothorax. There are bilateral pleural
effusions with bibasilar atelectatic change. Heart is enlarged with
mild pulmonary venous hypertension. No adenopathy evident. Patient
is status post coronary artery bypass grafting. There is
atherosclerotic calcification in the aorta.
IMPRESSION: Tube and catheter positions as described without pneumothorax.
Evidence of congestive heart failure. No airspace consolidation
appreciable. There is aortic atherosclerosis.

## 2016-10-19 IMAGING — DX DG CHEST 1V
1 series · 1 of 1 positions shown · non-contrast
Comparison: 07/10/2016.

CLINICAL DATA: Shortness of breath.

EXAM:
CHEST 1 VIEW

[chest ap]
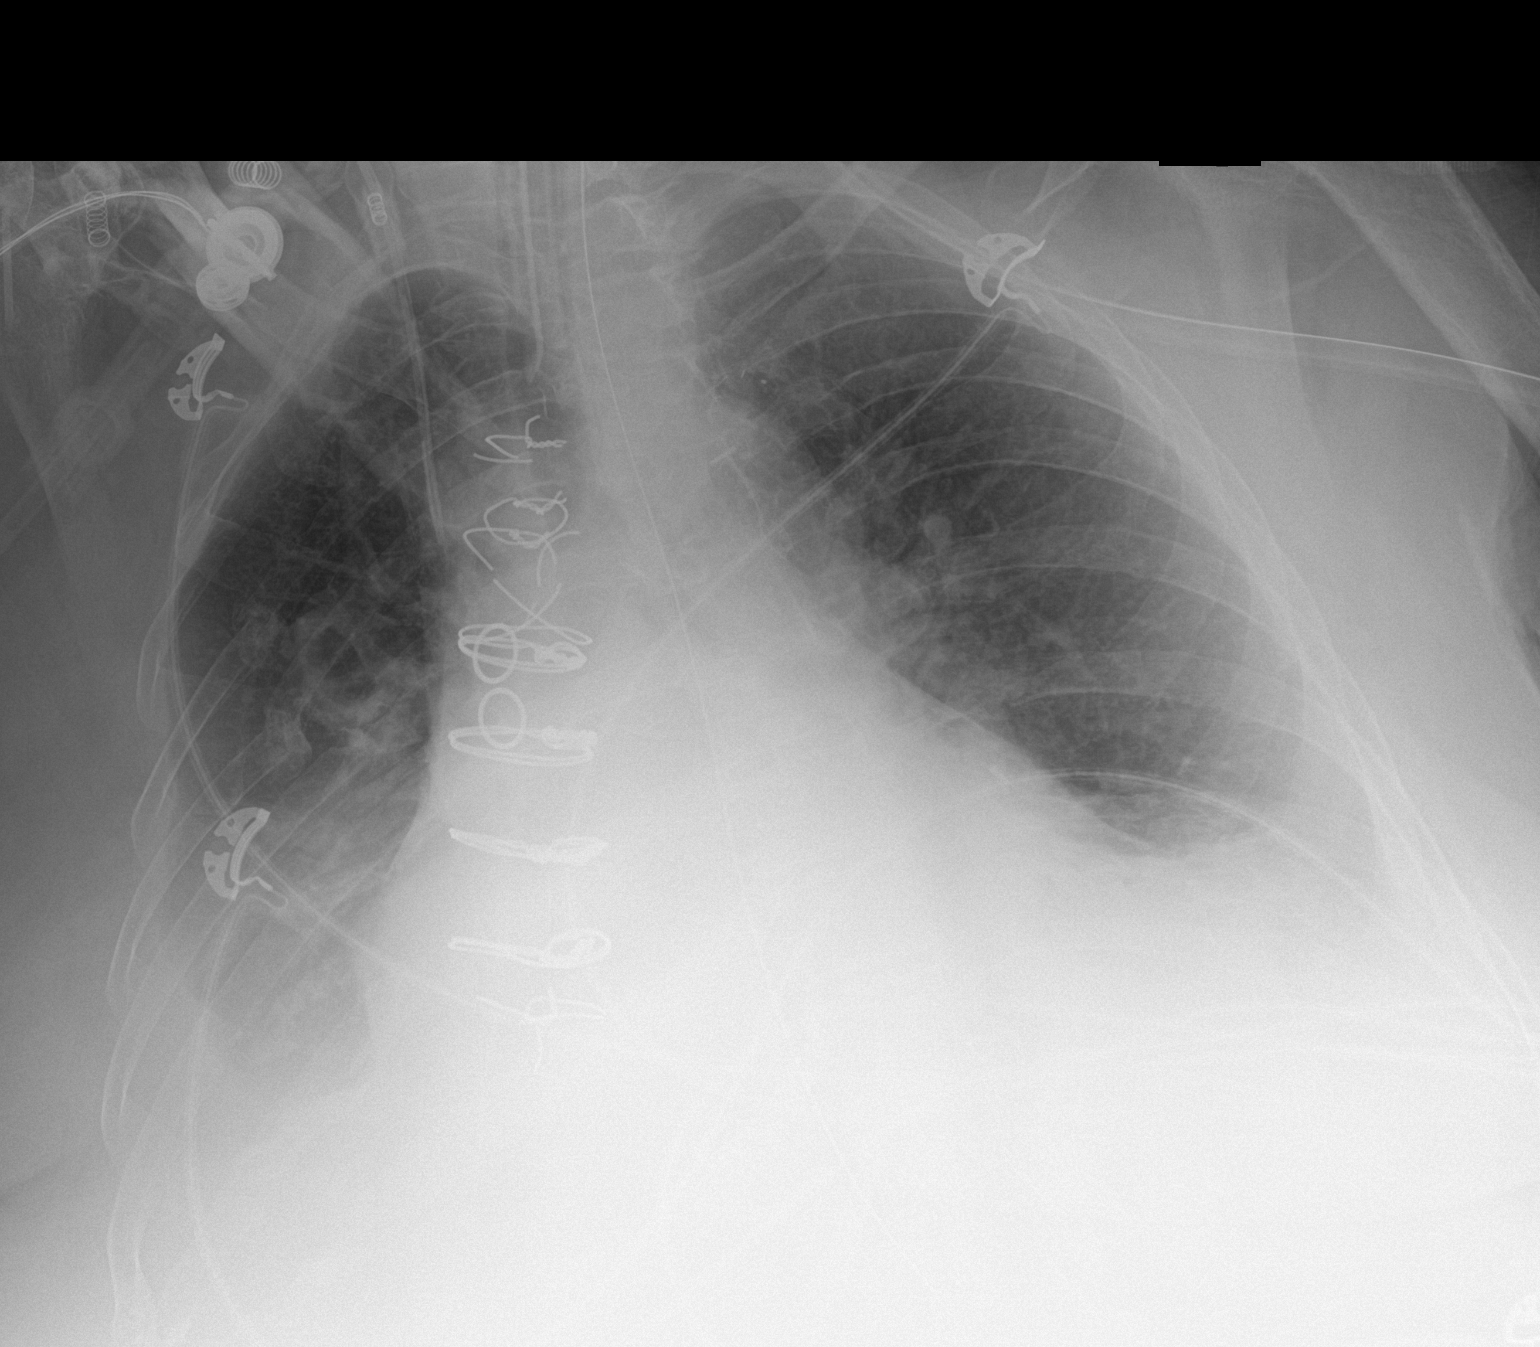

[1 of 1 positions shown; findings below may reference images not displayed]

FINDINGS: Endotracheal tube, NG tube, right IJ line stable position. Prior
CABG. Cardiomegaly pulmonary vascular prominence and bilateral
interstitial prominence bilateral effusions. Findings consistent
with congestive heart failure. Low lung volumes with basilar
atelectasis. No pneumothorax
IMPRESSION: 1. Lines and tubes in stable position.
2. Prior CABG. Persistent cardiomegaly with bilateral from
interstitial prominence and pleural effusions consistent persistent
congestive heart failure. No interim change prior exam.
